# Patient Record
Sex: Male | Born: 1955 | Race: White | Hispanic: No | Marital: Married | Smoking: Never smoker
Health system: Southern US, Academic
[De-identification: ages and names within clinical notes are randomized; demographics above are authoritative.]

## PROBLEM LIST (undated history)

## (undated) ENCOUNTER — Encounter (HOSPITAL_COMMUNITY): Admission: RE | Payer: Self-pay | Source: Ambulatory Visit

## (undated) DIAGNOSIS — E119 Type 2 diabetes mellitus without complications: Secondary | ICD-10-CM

## (undated) DIAGNOSIS — M069 Rheumatoid arthritis, unspecified: Secondary | ICD-10-CM

## (undated) DIAGNOSIS — Z973 Presence of spectacles and contact lenses: Secondary | ICD-10-CM

## (undated) DIAGNOSIS — R233 Spontaneous ecchymoses: Secondary | ICD-10-CM

## (undated) DIAGNOSIS — N289 Disorder of kidney and ureter, unspecified: Secondary | ICD-10-CM

## (undated) DIAGNOSIS — M539 Dorsopathy, unspecified: Secondary | ICD-10-CM

## (undated) DIAGNOSIS — E039 Hypothyroidism, unspecified: Secondary | ICD-10-CM

## (undated) DIAGNOSIS — I219 Acute myocardial infarction, unspecified: Secondary | ICD-10-CM

## (undated) DIAGNOSIS — R238 Other skin changes: Secondary | ICD-10-CM

## (undated) DIAGNOSIS — R6889 Other general symptoms and signs: Secondary | ICD-10-CM

## (undated) DIAGNOSIS — M1990 Unspecified osteoarthritis, unspecified site: Secondary | ICD-10-CM

## (undated) DIAGNOSIS — C801 Malignant (primary) neoplasm, unspecified: Secondary | ICD-10-CM

## (undated) DIAGNOSIS — R12 Heartburn: Secondary | ICD-10-CM

## (undated) DIAGNOSIS — I10 Essential (primary) hypertension: Secondary | ICD-10-CM

## (undated) DIAGNOSIS — C73 Malignant neoplasm of thyroid gland: Secondary | ICD-10-CM

## (undated) DIAGNOSIS — E785 Hyperlipidemia, unspecified: Secondary | ICD-10-CM

## (undated) HISTORY — PX: HX HEART CATHETERIZATION: SHX148

## (undated) HISTORY — PX: HX WRIST FRACTURE TX: SHX121

## (undated) HISTORY — DX: Malignant neoplasm of thyroid gland (CMS HCC): C73

## (undated) HISTORY — PX: KNEE SURGERY: SHX244

## (undated) HISTORY — DX: Essential (primary) hypertension: I10

## (undated) HISTORY — DX: Type 2 diabetes mellitus without complications (CMS HCC): E11.9

## (undated) HISTORY — PX: TOTAL THYROIDECTOMY: SHX2547

## (undated) HISTORY — PX: COLONOSCOPY: WVUENDOPRO10

## (undated) HISTORY — DX: Disorder of kidney and ureter, unspecified: N28.9

## (undated) HISTORY — PX: SHOULDER SURGERY: SHX246

## (undated) HISTORY — DX: Rheumatoid arthritis, unspecified (CMS HCC): M06.9

## (undated) SURGERY — IR ARTHROGRAM - HIP LEFT
Laterality: Left

---

## 2010-01-09 ENCOUNTER — Ambulatory Visit (HOSPITAL_COMMUNITY): Payer: Self-pay | Admitting: INTERNAL MEDICINE

## 2016-01-13 LAB — BASIC METABOLIC PANEL
BUN/CREAT RATIO: 12
BUN: 20
CARBON DIOXIDE: 22
CHLORIDE: 103
CREATININE: 1.66
ESTIMATED GLOMERULAR FILTRATION RATE: 44
POTASSIUM: 4.7
SODIUM: 143

## 2017-04-07 ENCOUNTER — Ambulatory Visit
Admission: RE | Admit: 2017-04-07 | Discharge: 2017-04-07 | Disposition: A | Discharge status: Home / Self Care | Payer: PRIVATE HEALTH INSURANCE | Source: Ambulatory Visit | Attending: Orthopaedic Surgery | Admitting: Orthopaedic Surgery

## 2017-04-07 ENCOUNTER — Encounter (INDEPENDENT_AMBULATORY_CARE_PROVIDER_SITE_OTHER): Payer: Self-pay

## 2017-04-07 ENCOUNTER — Encounter (HOSPITAL_BASED_OUTPATIENT_CLINIC_OR_DEPARTMENT_OTHER): Payer: Self-pay | Admitting: Orthopaedic Surgery

## 2017-04-07 ENCOUNTER — Ambulatory Visit (HOSPITAL_BASED_OUTPATIENT_CLINIC_OR_DEPARTMENT_OTHER): Payer: PRIVATE HEALTH INSURANCE | Admitting: Orthopaedic Surgery

## 2017-04-07 VITALS — BP 124/77 | HR 88 | Temp 97.9°F | Ht 73.19 in | Wt 297.0 lb

## 2017-04-07 DIAGNOSIS — M069 Rheumatoid arthritis, unspecified: Secondary | ICD-10-CM | POA: Insufficient documentation

## 2017-04-07 DIAGNOSIS — I10 Essential (primary) hypertension: Secondary | ICD-10-CM | POA: Insufficient documentation

## 2017-04-07 DIAGNOSIS — M25751 Osteophyte, right hip: Secondary | ICD-10-CM

## 2017-04-07 DIAGNOSIS — E119 Type 2 diabetes mellitus without complications: Secondary | ICD-10-CM | POA: Insufficient documentation

## 2017-04-07 DIAGNOSIS — Z7984 Long term (current) use of oral hypoglycemic drugs: Secondary | ICD-10-CM | POA: Insufficient documentation

## 2017-04-07 DIAGNOSIS — M1611 Unilateral primary osteoarthritis, right hip: Secondary | ICD-10-CM | POA: Insufficient documentation

## 2017-04-07 DIAGNOSIS — Z7982 Long term (current) use of aspirin: Secondary | ICD-10-CM | POA: Insufficient documentation

## 2017-04-07 DIAGNOSIS — Z79891 Long term (current) use of opiate analgesic: Secondary | ICD-10-CM | POA: Insufficient documentation

## 2017-04-07 DIAGNOSIS — M25559 Pain in unspecified hip: Secondary | ICD-10-CM

## 2017-04-07 DIAGNOSIS — Z85850 Personal history of malignant neoplasm of thyroid: Secondary | ICD-10-CM | POA: Insufficient documentation

## 2017-04-07 DIAGNOSIS — Z79899 Other long term (current) drug therapy: Secondary | ICD-10-CM | POA: Insufficient documentation

## 2017-04-07 DIAGNOSIS — N289 Disorder of kidney and ureter, unspecified: Secondary | ICD-10-CM | POA: Insufficient documentation

## 2017-04-07 DIAGNOSIS — M25551 Pain in right hip: Secondary | ICD-10-CM

## 2017-04-07 MED ORDER — TRAMADOL 50 MG TABLET
1.00 | ORAL_TABLET | Freq: Four times a day (QID) | ORAL | 1 refills | Status: DC | PRN
Start: 2017-04-07 — End: 2017-05-24

## 2017-04-07 NOTE — Addendum Note (Signed)
Addended by: Mauri Pole R on: 04/07/2017 04:31 PM     Modules accepted: Orders

## 2017-04-07 NOTE — H&P (Signed)
[facility]   DEPARTMENT OF ORTHOPAEDICS  HISTORY AND PHYSICAL    CHIEF COMPLAINT  Right hip pain    HISTORY OF PRESENT ILLNESS  [name] is a 62 y.o. male who presents to the clinic today with a many month history of Hip pain. He has a history of type 2 diabetes, RA, thyroid CA, HTN, kidney disease (creatinine 1.65) and continued right hip pain. The pain is located in the right groin and radiates down into the right knee. He was seen by a local orthopedic physician who recommended a right hip injection. He had this done in November 2018 with 75% relief for one week. He has offered hip replacement by his local surgeon, but did not pursue this because that surgeon only offered posterior approach. The patient describes the pain as aching, sharp, shooting, throbbing and stabbing and rates the pain as 9/10. Walking and standing aggravates the pain and resting helps to alleviate the pain. The patient has been taking tramadol for the pain. He takes oral methotrexate for his RA. he has tried NSAIDs, Cortisone injections and Bracing. 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    MEDICATIONS  Current Outpatient Medications   Medication Sig   . ascorbic acid (VITAMIN C ORAL) Take by mouth   . aspirin (ECOTRIN) 81 mg Oral Tablet, Delayed Release (E.C.) Take 81 mg by mouth Once a day   . atorvastatin (LIPITOR) 40 mg Oral Tablet Take 40 mg by mouth Every evening   . calcium carbonate/vitamin D3 (VITAMIN D-3 ORAL) Take by mouth   . FOLIC ACID ORAL Take by mouth   . hydroxychloroquine (PLAQUENIL) 200 mg Oral Tablet Take 200 mg by mouth Twice daily   . IRON ORAL Take by mouth   . levothyroxine (SYNTHROID) 200 mcg Oral Tablet Take 200 mcg by mouth Every morning   . levothyroxine (SYNTHROID) 25 mcg Oral Tablet Take 25 mcg by mouth Every morning   . losartan (COZAAR) 50 mg Oral Tablet Take 50 mg by mouth Once a day   . MetFORMIN (GLUCOPHAGE) 1,000  mg Oral Tablet Take 1,000 mg by mouth Twice daily with food   . methotrexate 2.5 mg tablet Take by mouth Every 7 days   . metoprolol tartrate (LOPRESSOR) 25 mg Oral Tablet Take 25 mg by mouth Twice daily   . omega-3 fatty acids (FISH OIL ORAL) Take by mouth   . sulfaSALAzine (AZULFIDINE) 500 mg Oral Tablet Take 500 mg by mouth Twice daily   . traMADol (ULTRAM) 50 mg Oral Tablet Take by mouth Every 6 hours as needed for Pain       ALLERGIES  No Known Allergies    PAST MEDICAL HISTORY  Past Medical History:   Diagnosis Date   . Diabetes (CMS [place])    . HTN (hypertension)    . Kidney disease    . Rheumatoid arthritis (CMS [place])    . Thyroid cancer (CMS [place])          PAST SURGICAL HISTORY  Past Surgical History:   Procedure Laterality Date   . KNEE SURGERY     . SHOULDER SURGERY         SOCIAL HISTORY  Current Occupation:  Ecologist  Marital status:  Married  Patient resides:  With spouse  Current tobacco use:  None  Current alcohol use:  None  Recreational drug use:  None  FAMILY HISTORY  Family Medical History:     None            REVIEW OF SYSTEMS  General: Negative for fevers,  night sweats or unintentional weight loss  Cardiovascular: Negative for chest pain  Respiratory: Negative for shortness of breath  Gastrointestinal: Negative for digestive problems  Genitourinary: Negative for urinary problems  Musculoskeletal: positive for right hip pain    All other systems reviewed and negative    PHYSICAL EXAMINATION   BP 124/77   Pulse 88   Temp 36.6 C (97.9 F) (Thermal Scan)   Ht 1.859 m (6' 1.19")   Wt 134.7 kg (296 lb 15.4 oz)   BMI 38.98 kg/m       GEN:   NAD  EYES: white sclera, round pupils  CV:   PT and  DP pulses present bilaterally.  PULM:   Normal respiratory effort.    GI: soft, nontender abdomen.  MS:   No evidence of varus or valgus deformity. Right hip ROM with 80 degrees flexion, 0 degrees internal rotation, 10 degrees external rotation, 5 degrees abduction, 2 degrees of adduction.   . positive log roll on the right. Calves soft, supple, nontender and without erythema.  NEURO:   Alert and oriented to person, place and time.    PSYCHOSOCIAL:   Pleasant.  Normal affect.    WOUND/INCISION:   No wounds present.     RADIOGRAPHS  Radiographs were performed and reviewed at this visit.    X-Ray of bilateral hips was interpreted by PA-C in clinic to show moderate joint space narrowing and sclerosis, better evidenced on right hip MRI.    ASSESSMENT     ICD-10-CM    1. Hip pain M25.559 [facility] x-ray   2. Primary osteoarthritis of right hip M16.11    3. Rheumatoid arthritis, involving unspecified site, unspecified rheumatoid factor presence (CMS HCC) M06.9    4. Diabetes (CMS [place]) E11.9        PLAN  [name] is a 62 y.o. male who has right hip DJD, RA and type 2 diabetes.He has tried NSAIDs and Cortisone injections with little relief. Nothing short of a right total Hip replacement will relieve pain and improve function. We will begin the scheduling process for right total Hip replacement and medical clearance is required. The patient was given a book on joint arthroplasty today. We will see him back in clinic for their pre operative visit. They will require a 1-2 night inpatient stay. The patient was instructed to call with any new or concerning symptoms. All of the patient's questions were answered and he agreed to this plan. I will phone in tramadol to his pharmacy.      Cesar Pole, PA-C 04/07/2017, 16:16     Cc:   Cesar Rothman, MD  [facility] [address] [address]      Rheumatoid arthritis right hip with pannus and periarticular erosion  Will proceed with right  THA  Due to his size, we will do this postero-lateral   The patient was seen in tandem with the APP and these are my findings:  Hip pain    Primary osteoarthritis of right hip    Rheumatoid arthritis, involving unspecified site, unspecified rheumatoid factor presence (CMS [place])    Diabetes (CMS [place])  I personally saw and examined the patient. See physician's assistant note for additional details.     Cesar Code, MD  [phone]

## 2017-04-18 ENCOUNTER — Encounter (HOSPITAL_BASED_OUTPATIENT_CLINIC_OR_DEPARTMENT_OTHER): Payer: Self-pay | Admitting: Orthopaedic Surgery

## 2017-05-24 ENCOUNTER — Encounter (HOSPITAL_BASED_OUTPATIENT_CLINIC_OR_DEPARTMENT_OTHER): Payer: Self-pay

## 2017-05-24 ENCOUNTER — Ambulatory Visit (HOSPITAL_BASED_OUTPATIENT_CLINIC_OR_DEPARTMENT_OTHER): Payer: Self-pay | Admitting: Orthopaedic Surgery

## 2017-05-24 ENCOUNTER — Encounter (HOSPITAL_BASED_OUTPATIENT_CLINIC_OR_DEPARTMENT_OTHER): Payer: Self-pay | Admitting: Orthopaedic Surgery

## 2017-05-24 ENCOUNTER — Ambulatory Visit (HOSPITAL_BASED_OUTPATIENT_CLINIC_OR_DEPARTMENT_OTHER)
Admission: RE | Admit: 2017-05-24 | Discharge: 2017-05-24 | Disposition: A | Discharge status: Home / Self Care | Payer: PRIVATE HEALTH INSURANCE | Source: Ambulatory Visit

## 2017-05-24 ENCOUNTER — Ambulatory Visit (HOSPITAL_BASED_OUTPATIENT_CLINIC_OR_DEPARTMENT_OTHER): Payer: PRIVATE HEALTH INSURANCE

## 2017-05-24 ENCOUNTER — Ambulatory Visit (HOSPITAL_BASED_OUTPATIENT_CLINIC_OR_DEPARTMENT_OTHER): Payer: PRIVATE HEALTH INSURANCE | Admitting: Orthopaedic Surgery

## 2017-05-24 ENCOUNTER — Ambulatory Visit
Admission: RE | Admit: 2017-05-24 | Discharge: 2017-05-24 | Disposition: A | Discharge status: Home / Self Care | Payer: PRIVATE HEALTH INSURANCE | Source: Ambulatory Visit | Attending: Orthopaedic Surgery | Admitting: Orthopaedic Surgery

## 2017-05-24 VITALS — BP 150/82 | HR 64 | Temp 97.2°F | Ht 72.68 in | Wt 301.6 lb

## 2017-05-24 DIAGNOSIS — Z7982 Long term (current) use of aspirin: Secondary | ICD-10-CM | POA: Insufficient documentation

## 2017-05-24 DIAGNOSIS — Z01818 Encounter for other preprocedural examination: Secondary | ICD-10-CM | POA: Insufficient documentation

## 2017-05-24 DIAGNOSIS — E1129 Type 2 diabetes mellitus with other diabetic kidney complication: Secondary | ICD-10-CM | POA: Insufficient documentation

## 2017-05-24 DIAGNOSIS — I129 Hypertensive chronic kidney disease with stage 1 through stage 4 chronic kidney disease, or unspecified chronic kidney disease: Secondary | ICD-10-CM | POA: Insufficient documentation

## 2017-05-24 DIAGNOSIS — M1611 Unilateral primary osteoarthritis, right hip: Secondary | ICD-10-CM

## 2017-05-24 DIAGNOSIS — E1122 Type 2 diabetes mellitus with diabetic chronic kidney disease: Secondary | ICD-10-CM

## 2017-05-24 DIAGNOSIS — M05751 Rheumatoid arthritis with rheumatoid factor of right hip without organ or systems involvement: Secondary | ICD-10-CM

## 2017-05-24 DIAGNOSIS — N183 Chronic kidney disease, stage 3 (moderate): Secondary | ICD-10-CM | POA: Insufficient documentation

## 2017-05-24 DIAGNOSIS — Z85850 Personal history of malignant neoplasm of thyroid: Secondary | ICD-10-CM | POA: Insufficient documentation

## 2017-05-24 DIAGNOSIS — M069 Rheumatoid arthritis, unspecified: Secondary | ICD-10-CM | POA: Insufficient documentation

## 2017-05-24 DIAGNOSIS — Z79899 Other long term (current) drug therapy: Secondary | ICD-10-CM | POA: Insufficient documentation

## 2017-05-24 DIAGNOSIS — Z7984 Long term (current) use of oral hypoglycemic drugs: Secondary | ICD-10-CM | POA: Insufficient documentation

## 2017-05-24 HISTORY — DX: Spontaneous ecchymoses: R23.3

## 2017-05-24 HISTORY — DX: Acute myocardial infarction, unspecified (CMS HCC): I21.9

## 2017-05-24 HISTORY — DX: Presence of spectacles and contact lenses: Z97.3

## 2017-05-24 HISTORY — DX: Heartburn: R12

## 2017-05-24 HISTORY — DX: Type 2 diabetes mellitus without complications (CMS HCC): E11.9

## 2017-05-24 HISTORY — DX: Other general symptoms and signs: R68.89

## 2017-05-24 HISTORY — DX: Dorsopathy, unspecified: M53.9

## 2017-05-24 HISTORY — DX: Malignant (primary) neoplasm, unspecified (CMS HCC): C80.1

## 2017-05-24 HISTORY — DX: Unspecified osteoarthritis, unspecified site: M19.90

## 2017-05-24 HISTORY — DX: Hyperlipidemia, unspecified: E78.5

## 2017-05-24 HISTORY — DX: Other skin changes: R23.8

## 2017-05-24 HISTORY — DX: Hypothyroidism, unspecified: E03.9

## 2017-05-24 LAB — CBC WITH DIFF
BASOPHIL #: 0.07 x10ˆ3/uL (ref 0.00–0.20)
BASOPHIL %: 2 %
EOSINOPHIL #: 0.45 x10ˆ3/uL (ref 0.00–0.50)
EOSINOPHIL %: 9 %
HCT: 42.2 % (ref 36.7–47.0)
HGB: 14.1 g/dL (ref 12.5–16.3)
LYMPHOCYTE #: 1.02 x10ˆ3/uL (ref 1.00–4.80)
LYMPHOCYTE %: 20 %
MCH: 33 pg (ref 27.4–33.0)
MCHC: 33.3 g/dL (ref 32.5–35.8)
MCV: 99.2 fL (ref 78.0–100.0)
MONOCYTE #: 0.54 x10ˆ3/uL (ref 0.30–1.00)
MONOCYTE %: 11 %
MPV: 8.4 fL (ref 7.5–11.5)
NEUTROPHIL #: 2.97 x10ˆ3/uL (ref 1.50–7.70)
NEUTROPHIL %: 59 %
PLATELETS: 212 x10ˆ3/uL (ref 140–450)
RBC: 4.25 x10ˆ6/uL (ref 4.06–5.63)
RDW: 13.6 % (ref 12.0–15.0)
WBC: 5 x10ˆ3/uL (ref 3.5–11.0)

## 2017-05-24 LAB — BASIC METABOLIC PANEL
ANION GAP: 7 mmol/L (ref 4–13)
BUN/CREA RATIO: 13 (ref 6–22)
BUN: 19 mg/dL (ref 8–25)
CALCIUM: 9.4 mg/dL (ref 8.5–10.2)
CHLORIDE: 109 mmol/L (ref 96–111)
CO2 TOTAL: 26 mmol/L (ref 22–32)
CREATININE: 1.47 mg/dL — ABNORMAL HIGH (ref 0.62–1.27)
ESTIMATED GFR: 49 mL/min/1.73mˆ2 — ABNORMAL LOW (ref >59)
GLUCOSE: 115 mg/dL (ref 65–139)
POTASSIUM: 4.7 mmol/L (ref 3.5–5.1)
SODIUM: 142 mmol/L (ref 136–145)

## 2017-05-24 LAB — ABO/RH AND ANTIBODY SCREEN
ABO/RH(D): A POS
ANTIBODY SCREEN: NEGATIVE

## 2017-05-24 LAB — POC BLOOD GLUCOSE (RESULTS): GLUCOSE, POC: 116 mg/dl — ABNORMAL HIGH (ref 70–105)

## 2017-05-24 MED ORDER — CELECOXIB 400 MG CAPSULE
400.0000 mg | ORAL_CAPSULE | Freq: Once | ORAL | Status: DC
Start: 2017-05-24 — End: 2017-05-24

## 2017-05-24 NOTE — H&P (Signed)
[facility]   DEPARTMENT OF ORTHOPAEDICS  HISTORY AND PHYSICAL      PRE-OPERATIVE DIAGNOSIS  Right hip DJD    CHIEF COMPLAINT  Pre-Operative History and Physical Examination for R THA scheduled on 06/15/17.    HISTORY OF PRESENT ILLNESS   [name] is a 62 y.o. male who returns to the clinic today for his Pre-Operative History and Physical Examination. The patient is scheduled for R THA on 06/15/17. The patient has a history of DJD, RA, thyroid CA, kidney disease and T2DM. He is taking methotrexate. The patient has failed conservative therapies such as NSAIDs. After discussion with the patient, we decided that surgery is the best option at this time. Today, the patient denies recent fevers/chills, fatigue, weight loss, N/V, URI or UTI and he denies any open wounds on the body. negative personal history of blood clots. negative family history of blood clots. negative history of latex or metal allergy. negative history of anesthesia complications. negative history of sleep apnea. The patient has been cleared by his PCP, Dr. Ellison Carwin. Labs are acceptable. He will have a stress test done next week, recommended by his new cardiologist. He denies any recent CP or SOB. He will have chest CT on Friday to reassess his mediastinal nodules. He does weekly methotrexate injections.     MEDICATIONS  Current Outpatient Medications   Medication Sig   . ascorbic acid (VITAMIN C ORAL) Take by mouth   . aspirin (ECOTRIN) 81 mg Oral Tablet, Delayed Release (E.C.) Take 81 mg by mouth Once a day   . atorvastatin (LIPITOR) 40 mg Oral Tablet Take 40 mg by mouth Every evening   . calcium carbonate/vitamin D3 (VITAMIN D-3 ORAL) Take by mouth   . FOLIC ACID ORAL Take by mouth   . hydroxychloroquine (PLAQUENIL) 200 mg Oral Tablet Take 200 mg by mouth Twice daily   . IRON ORAL Take by mouth   . levothyroxine (SYNTHROID) 200 mcg Oral Tablet Take 200 mcg by mouth Every morning   . levothyroxine (SYNTHROID) 25  mcg Oral Tablet Take 50 mcg by mouth Every morning    . losartan (COZAAR) 50 mg Oral Tablet Take 50 mg by mouth Once a day   . MetFORMIN (GLUCOPHAGE) 1,000 mg Oral Tablet Take 1,000 mg by mouth Twice daily with food   . methotrexate 2.5 mg tablet Take by mouth Every 7 days   . metoprolol tartrate (LOPRESSOR) 25 mg Oral Tablet Take 25 mg by mouth Twice daily   . omega-3 fatty acids (FISH OIL ORAL) Take by mouth   . sulfaSALAzine (AZULFIDINE) 500 mg Oral Tablet Take 500 mg by mouth Twice daily   . traMADol (ULTRAM) 50 mg Oral Tablet Take 1 Tab (50 mg total) by mouth Every 6 hours as needed for Pain       ALLERGIES  No Known Allergies  denies allergy to Latex, metal, or acrylic.    PAST MEDICAL HISTORY  Past Medical History:   Diagnosis Date   . Diabetes (CMS [place])    . HTN (hypertension)    . Kidney disease    . Rheumatoid arthritis (CMS [place])    . Thyroid cancer (CMS [place])          denies history of DVT, PE, or MRSA.     PAST SURGICAL HISTORY  Past Surgical History:   Procedure Laterality Date   . KNEE SURGERY     . SHOULDER SURGERY  SOCIAL HISTORY  Social History     Tobacco Use   . Smoking status: Never Smoker   . Smokeless tobacco: Never Used   Substance Use Topics   . Alcohol use: Not on file   . Drug use: Not on file       FAMILY HISTORY  Family Medical History:     None              REVIEW OF SYSTEMS  General: Negative for fevers or night sweats   Cardiovascular: Negative for chest pain  Respiratory: Negative for shortness of breath  Gastrointestinal: Negative for digestive problems  Genitourinary: Negative for urinary problems  Musculoskeletal: positive for right hip pain    All other systems reviewed and negative.    PHYSICAL EXAMINATION   BP (!) 150/82   Pulse 64   Temp 36.2 C (97.2 F) (Thermal Scan)   Ht 1.846 m (6' 0.68")   Wt (!) 136.8 kg (301 lb 9.4 oz)   BMI 40.14 kg/m       GEN:   NAD  EYES:  White sclera, round pupils.  NECK:   Full ROM. Trachea midline.  CV:   DP pulses present  bilaterally.  PULM:   Normal respiratory effort.    ABD:   Non distended.  MS:  Right hip ROM with 80 degrees flexion, 0 degrees internal rotation, 10 degrees external rotation, 5 degrees abduction, 2 degrees adduction. Peroneal and tibial nerves are intact. Calves soft, supple, nontender and without erythema.  NEURO:   Alert and oriented to person, place and time.   PSYCHOSOCIAL:   Pleasant.  Normal affect.    WOUND/INCISION:   No wounds present.      RADIOGRAPHS  previous radiographs were reviewed at this visit to show right hip with joint space narrowing and sclerosis.      ASSESSMENT   DJD Right  hip    ICD-10-CM    1. Pre-op evaluation Z01.818 CBC/Diff (PAU)     Basic Metabolic Panel (PAU)     ABO/RH AND ANTIBODY SCREEN   2. Primary osteoarthritis of right hip M16.11 CBC/Diff (PAU)     Basic Metabolic Panel (PAU)     ABO/RH AND ANTIBODY SCREEN   3. Type 2 diabetes mellitus with stage 3 chronic kidney disease, without long-term current use of insulin (CMS HCC) E11.22     N18.3    4. 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 R THA for rheumatoid arthritis  Date of surgery is 06/15/17.   Surgical spinal anesthesia will be used. Multimodal will be ordered.  Prophylactic antibiotic will be ancef.   He saw his cardiologist yesterday and will have a stress test done next week, we will await these results.   Due to his creatinine (1.65) he will not be prescribed any NSAIDs.  Implants will be smith and nephew.   Inpatient length of stay is expected to be 1-2 nights.   DVT prophylaxis will be xarelto 10mg  due to kidney disease (not in study)  Disposition will be Home discharge   The patient Accept blood products.   The patient Accept photographs or videos at the time of surgery.   2.  Medical records were reviewed at today's visit and the patient has received surgical clearance from Dr. Otelia Limes.  The risks and benefits of the surgery were discussed in  detail with the patient at today's  visit including infection, implant loosening, poly wear, fracture, DVT, pulmonary embolus and anesthesia complications. We explained that infection can be so severe as to necessitate multiple surgeries, permanent removal of the prothesis resulting in a shortened painful limb, or even amputation. We explained that these complication are relatively rare and sometimes devastating. We explained that we are obligated to explain the risks and alternative treatments so that the patient can make an informed decision.  Surgical Consent for a R THA was obtained and the Consent Form was signed and witnessed.  It is my belief that this was Informed Consent and the patient understood all key aspects of the operation.  3.  Pre-operative orders were placed. The patient was ordered IV tranexamic acid to be given intraoperatively.  We are going to draw labs today (CBC, BMP, ABO).  4. Pre operative instructions were reviewed, please see "patient instructions" in the AVS.   5. The patient was made aware that they will be discharged from the hospital with a prescription for opioids for post operative pain. We prescribe roxicodone 5mg , 2 tabs to take by mouth every 4 hours as needed for pain. We do not intend to refill this prescription, and the patient was made aware of this. The patients prior medication history was reviewed, and I have also reviewed information contained in the state controlled prescription drug monitoring database. I have discussed any history of non-pharmacological treatment with this patient. The patient denies a history of substance abuse treatment. Physical exam findings and/or clinical history warranting use of opioid treatment include: post operative pain. My goal of treatment is to relieve short term post operative pain. I have discussed the risk of opioid addiction with the patient. I have also discussed the risk of using sedatives and alcohol while taking opioids.            The patient was instructed to call with any concerns prior to surgery.  All the patient's questions were answered at this time and he understood and agreed to this plan.        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 See physician's assistant note for additional details.     Jacqualine Code, MD  [phone]

## 2017-05-24 NOTE — Progress Notes (Signed)
Total Joint History and Physical Teaching  HPI:   [name] is a 62 y.o. male seen in [place] for pre op teaching leading up to R THA with Dr. Caryl Comes.    Date of Surg: 06/15/17                       Past Medical History:   Diagnosis Date   . Diabetes (CMS [place])    . HTN (hypertension)    . Kidney disease    . Rheumatoid arthritis (CMS [place])    . Thyroid cancer (CMS [facility])                             Past Surgical History:   Procedure Laterality Date   . KNEE SURGERY     . SHOULDER SURGERY           Social Hx:  reports that he has never smoked. He has never used smokeless tobacco.                     Social History     Tobacco Use   Smoking Status Never Smoker   Smokeless Tobacco Never Used                      Pre operative Assessment:   Home environment:  Lives in a house with 2 steps to enter.  No steps inside with walk in shower    Social situation:  Lives with wife    Home Equipment:  FWW, cane  Plans to get raised toilet seat    Current Level of Function: antalgic gait with limited/modified activity    Patient Goals: return to work, golf, get outside without hurting    Pt reviewed Playbook for Total Joint Replacement with patient.   Discussed home set up and adaptations to meet post op needs.      Anticipated D/C Needs:   Equipment: hip kit for LB ADLs  Pt will get raised toilet seat    Recommendation: home with assistance    Therapist :     Judie Bonus, PT 05/24/2017 12:19  Pager #: [phone]

## 2017-05-24 NOTE — Nursing Note (Signed)
Patient attended pre op education class with their coach.  They have a playbook with my contact information including my pager number. Education material reviewed with patient.   They were encouraged to bring this information with them in their playbooks to surgery.  They were instructed that they would receive 84 tablets of oxycodone for post op pain control and that this medication would not be refilled.  They asked and answered questions appropriately. To determine what will be used for DVT prevention after discussion of PEPPER study

## 2017-05-24 NOTE — Anesthesia Preprocedure Evaluation (Addendum)
ANESTHESIA PRE-OP EVALUATION  Planned Procedure: ARTHROPLASTY HIP TOTAL (Right Hip)  Review of Systems                   Pulmonary  negative pulmonary ROS,    Cardiovascular    Hypertension, CAD and cardiac stents No angina,  Exercise Tolerance: > or = 4 METS        GI/Hepatic/Renal    Cr=1.4 and renal insufficiency no liver disease     Endo/Other    hypothyroidism and rheumatoid arthritis,   type 2 diabetes/ controlled     Neuro/Psych/MS   negative neuro/psych ROS,      Cancer                     Physical Assessment      Airway       Mallampati: III    TM distance: >3 FB    Neck ROM: full  Mouth Opening: good.  No Facial hair  No Beard  No endotracheal tube present  No Tracheostomy present    Dental       Dentition intact             Pulmonary    Breath sounds clear to auscultation       Cardiovascular    Rhythm: regular  Rate: Normal       Other findings            Plan  Planned anesthesia type: spinal    ASA 3         Anesthetic plan and risks discussed with patient and spouse.                                     EKG: Within last six months   04/13/2017   Findings:  Normal sinus Rhythm  Normal ECG    CXR: In last year   04/13/2017   Impression:  No acute finding.     Other Studies: labs 05/24/2017      Consults:     Patient instructed to take the following medications day of surgery, .

## 2017-05-24 NOTE — Patient Instructions (Addendum)
[facility] Pre-operative Instructions    Do not eat or drink after midnight the night before surgery    You may take ESSENTIALmedications with a small sip of water the morning of surgery    Do not chew tobacco after midnight the night before surgery    Do not chew gum or suck on mints after midnight before surgery    Do not take blood pressure medications called ACE Inhibitors the morning of surgery.  These are medications like Lisinopril (Prinivil, Zestril), Fosinopril(Monopril), Quinapril (Accupril), Captopril (Capoten)    Do not take blood pressure medications called ARBS the morning of surgery.  These aremedications like Losartan (Cozaar), Olmesartan (Benicar), Valsartan (Diovan), Irbesartan (Avapro), Atacand (Edarbi), Telmisartan (Micardis)    Stop taking aspirin and things that contain aspirin the week before surgery as it contributes to increased bleeding.  Aspirin is found in many combination medications like Alka Seltzer, BC Powder, and even some headache medications.     Stop sulfasalazine one week before surgery.  Please do not stop methotrexate and Plaquenil.    Stop Fish Oil and Vitamin E one week before surgery to reduce the risk of bleeding.    Stop taking Advil (ibuprofen, Motrin) or Aleve (naproxen,Naprosyn) and most other anti-inflammatory medications 3 days before surgery.  You may take Celebrex right up until the day before surgery.       Eat a healthier diet before and after surgery as it will help you heal faster.  Concentrate on foods high in protein like meats, fish, eggs, yoghurt, nuts and beans as well as fresh fruits, vegetables and whole grains.  Protein supplements found in Ensure and Boost can also be healthful.    Avoid rashes, cuts, scrapes, abrasions, scratches and burns near the surgical site as these can raise the risk of infection.  Dogs and cats are a bigsource of these.  Surgery is usually canceled when we encounter these the morning of  surgery.    Do not shave your leg or surgical site for at least one week before surgery.  Shaving often results in irritation if notnicks and scratches and these raise your risk for infection.  We will carefully remove hair at the surgical site (if needed) on the day of surgery.    Cleanse but DO NOT SCOUR the surgical site the night before andmorning of surgery with the Chlorhexidine soap given to you by the PAT nurses.  Use a soft clean cloth to apply.    Please sleep in freshly laundered sheets and freshly laundered bed clothes the night before surgery inorder to reduce the risk of infection.    If you become ill or develop and open sore or wound right before surgery, please call [phone] to reschedule your surgery for another date.  If after hours or on weekends,ask to speak to the resident on call.  Additionally, you may call the OR front desk after hours at [phone] if you are unable to reach the resident.    You may call the OR front desk if you have questions about your surgery time - [phone]    Peyton Bottoms MD  Assistant Professor Orthopaedic Surgery  [phone]

## 2017-05-30 ENCOUNTER — Telehealth (HOSPITAL_BASED_OUTPATIENT_CLINIC_OR_DEPARTMENT_OTHER): Payer: Self-pay | Admitting: Orthopaedic Surgery

## 2017-05-30 NOTE — Telephone Encounter (Signed)
Patient called and stated he went to urgent care as he was feeling ill.  His surgery is 3/13  They gave him 10 days of an antibiotic and 5 days of prednisone.  Instructed that if he is no better closer to his surgery date to please call me.  She verbalized understanding

## 2017-06-06 ENCOUNTER — Encounter (HOSPITAL_COMMUNITY): Payer: Self-pay

## 2017-06-06 NOTE — Progress Notes (Signed)
OUTPATIENT CARE MANAGEMENT ASSESSMENT    Care Management Pre-admission assessment -Met with pt and his wife during [place] pre-op class. He has no advanced directives.     Home Set up - Lives with his wife in a ranch house with 2 entry steps. Has a walk in shower.    Equipment - Has cane, walkers    Planned Therapy - His plan is to go home with family assist.    RAPT - 11    Planned DVT Prophylaxis - Xarelto    Planned Medicines - Has prescription coverage through [facility]- ID- [id number]. Called [phone], per Geraldine Solar is covered-first fill will be $412.00. No NSAIDs; ondansetron will need PA; oxycodone has QL 180/30.   Called for [phone], per Song, ondansetron has QL 21/7; oxycodone 84/7-should go through.  FYI--Enoxaparin-no PA--$72.46.    Planned procedure & surgery date - 3/13-R THA

## 2017-06-09 ENCOUNTER — Ambulatory Visit (HOSPITAL_BASED_OUTPATIENT_CLINIC_OR_DEPARTMENT_OTHER): Payer: Self-pay | Admitting: Orthopaedic Surgery

## 2017-06-09 DIAGNOSIS — Z96641 Presence of right artificial hip joint: Secondary | ICD-10-CM

## 2017-06-09 NOTE — Telephone Encounter (Signed)
Regarding: DME orders requested   ----- Message from Darius Bump sent at 06/09/2017 10:25 AM EST -----  Dr. Caryl Comes pt    Sonia Baller, a nurse with the pt's insurance company is requesting that an order for Bariatric raised toilet seat and a front wheeled walker be sent for the pt    [facility] Fax: [phone]    Thank you

## 2017-06-10 ENCOUNTER — Encounter (HOSPITAL_BASED_OUTPATIENT_CLINIC_OR_DEPARTMENT_OTHER): Payer: Self-pay | Admitting: Surgical

## 2017-06-14 MED ORDER — SODIUM CHLORIDE 0.9 % INTRAVENOUS SOLUTION
1000.00 mg | Freq: Once | INTRAVENOUS | Status: AC
Start: 2017-06-15 — End: 2017-06-15
  Administered 2017-06-15: 1000 mg via INTRAVENOUS
  Administered 2017-06-15: 0 mg via INTRAVENOUS
  Administered 2017-06-15: 16:00:00 1000 mg via INTRAVENOUS
  Filled 2017-06-14: qty 10

## 2017-06-14 MED ORDER — DEXTROSE 5 % IN WATER (D5W) INTRAVENOUS SOLUTION
3.0000 g | Freq: Once | INTRAVENOUS | Status: AC
Start: 2017-06-15 — End: 2017-06-15
  Administered 2017-06-15: 2 g via INTRAVENOUS
  Administered 2017-06-15: 3 g via INTRAVENOUS
  Filled 2017-06-14: qty 30

## 2017-06-14 MED ORDER — TRANEXAMIC ACID 1,000 MG/10 ML (100 MG/ML) INTRAVENOUS SOLUTION
1000.0000 mg | Freq: Once | INTRAVENOUS | Status: AC
Start: 2017-06-15 — End: 2017-06-15
  Administered 2017-06-15: 1000 mg via INTRAVENOUS
  Filled 2017-06-14: qty 10

## 2017-06-14 MED ORDER — EPINEPHRINE 1 MG/ML (1 ML) INJECTION SOLUTION
Freq: Once | INTRAMUSCULAR | Status: DC
Start: 2017-06-15 — End: 2017-06-14

## 2017-06-15 ENCOUNTER — Inpatient Hospital Stay
Admission: RE | Admit: 2017-06-15 | Discharge: 2017-06-16 | DRG: 470 | Disposition: A | Discharge status: Home / Self Care | Payer: PRIVATE HEALTH INSURANCE | Source: Ambulatory Visit | Attending: Orthopaedic Surgery | Admitting: Orthopaedic Surgery

## 2017-06-15 ENCOUNTER — Inpatient Hospital Stay (HOSPITAL_BASED_OUTPATIENT_CLINIC_OR_DEPARTMENT_OTHER): Payer: PRIVATE HEALTH INSURANCE | Admitting: Certified Registered"

## 2017-06-15 ENCOUNTER — Inpatient Hospital Stay (HOSPITAL_COMMUNITY): Payer: PRIVATE HEALTH INSURANCE | Admitting: Family

## 2017-06-15 ENCOUNTER — Encounter (HOSPITAL_COMMUNITY): Payer: Self-pay

## 2017-06-15 ENCOUNTER — Inpatient Hospital Stay (HOSPITAL_COMMUNITY)
Admission: RE | Admit: 2017-06-15 | Discharge: 2017-06-15 | Disposition: A | Discharge status: Home / Self Care | Payer: PRIVATE HEALTH INSURANCE | Source: Ambulatory Visit

## 2017-06-15 ENCOUNTER — Inpatient Hospital Stay (HOSPITAL_COMMUNITY): Payer: PRIVATE HEALTH INSURANCE

## 2017-06-15 ENCOUNTER — Inpatient Hospital Stay (HOSPITAL_COMMUNITY): Payer: PRIVATE HEALTH INSURANCE | Admitting: Orthopaedic Surgery

## 2017-06-15 ENCOUNTER — Encounter (HOSPITAL_COMMUNITY)
Admission: RE | Disposition: A | Discharge status: Home / Self Care | Payer: Self-pay | Source: Ambulatory Visit | Attending: Orthopaedic Surgery

## 2017-06-15 DIAGNOSIS — M1611 Unilateral primary osteoarthritis, right hip: Secondary | ICD-10-CM

## 2017-06-15 DIAGNOSIS — M069 Rheumatoid arthritis, unspecified: Secondary | ICD-10-CM

## 2017-06-15 DIAGNOSIS — D72829 Elevated white blood cell count, unspecified: Secondary | ICD-10-CM | POA: Diagnosis not present

## 2017-06-15 DIAGNOSIS — Z85850 Personal history of malignant neoplasm of thyroid: Secondary | ICD-10-CM

## 2017-06-15 DIAGNOSIS — Z6841 Body Mass Index (BMI) 40.0 and over, adult: Secondary | ICD-10-CM

## 2017-06-15 DIAGNOSIS — Z96641 Presence of right artificial hip joint: Secondary | ICD-10-CM

## 2017-06-15 DIAGNOSIS — N179 Acute kidney failure, unspecified: Secondary | ICD-10-CM | POA: Diagnosis not present

## 2017-06-15 DIAGNOSIS — Z79891 Long term (current) use of opiate analgesic: Secondary | ICD-10-CM

## 2017-06-15 DIAGNOSIS — Z7984 Long term (current) use of oral hypoglycemic drugs: Secondary | ICD-10-CM

## 2017-06-15 DIAGNOSIS — I129 Hypertensive chronic kidney disease with stage 1 through stage 4 chronic kidney disease, or unspecified chronic kidney disease: Secondary | ICD-10-CM | POA: Diagnosis present

## 2017-06-15 DIAGNOSIS — Z79890 Hormone replacement therapy (postmenopausal): Secondary | ICD-10-CM

## 2017-06-15 DIAGNOSIS — D62 Acute posthemorrhagic anemia: Secondary | ICD-10-CM | POA: Diagnosis not present

## 2017-06-15 DIAGNOSIS — Z9889 Other specified postprocedural states: Secondary | ICD-10-CM

## 2017-06-15 DIAGNOSIS — Z7982 Long term (current) use of aspirin: Secondary | ICD-10-CM

## 2017-06-15 DIAGNOSIS — Z79899 Other long term (current) drug therapy: Secondary | ICD-10-CM

## 2017-06-15 DIAGNOSIS — N189 Chronic kidney disease, unspecified: Secondary | ICD-10-CM | POA: Diagnosis present

## 2017-06-15 DIAGNOSIS — T380X5A Adverse effect of glucocorticoids and synthetic analogues, initial encounter: Secondary | ICD-10-CM | POA: Diagnosis present

## 2017-06-15 DIAGNOSIS — E1122 Type 2 diabetes mellitus with diabetic chronic kidney disease: Secondary | ICD-10-CM | POA: Diagnosis present

## 2017-06-15 LAB — CBC
HCT: 37.2 % (ref 36.7–47.0)
HGB: 12.6 g/dL (ref 12.5–16.3)
MCH: 33.2 pg — ABNORMAL HIGH (ref 27.4–33.0)
MCHC: 33.8 g/dL (ref 32.5–35.8)
MCV: 98.2 fL (ref 78.0–100.0)
MPV: 9.5 fL (ref 7.5–11.5)
PLATELETS: 142 x10ˆ3/uL (ref 140–450)
RBC: 3.79 x10ˆ6/uL — ABNORMAL LOW (ref 4.06–5.63)
RDW: 13.5 % (ref 12.0–15.0)
WBC: 13 x10ˆ3/uL — ABNORMAL HIGH (ref 3.5–11.0)

## 2017-06-15 LAB — TYPE AND SCREEN
ABO/RH(D): A POS
ANTIBODY SCREEN: NEGATIVE

## 2017-06-15 LAB — POC BLOOD GLUCOSE (RESULTS)
GLUCOSE, POC: 146 mg/dl — ABNORMAL HIGH (ref 70–105)
GLUCOSE, POC: 146 mg/dl — ABNORMAL HIGH (ref 70–105)
GLUCOSE, POC: 199 mg/dl — ABNORMAL HIGH (ref 70–105)
GLUCOSE, POC: 297 mg/dl — ABNORMAL HIGH (ref 70–105)

## 2017-06-15 LAB — CREATININE WITH EGFR
CREATININE: 1.45 mg/dL — ABNORMAL HIGH (ref 0.62–1.27)
ESTIMATED GFR: 49 mL/min/1.73mˆ2 — ABNORMAL LOW (ref >59)

## 2017-06-15 SURGERY — ARTHROPLASTY HIP TOTAL
Anesthesia: Spinal | Site: Hip | Laterality: Right | Wound class: Clean Wound: Uninfected operative wounds in which no inflammation occurred

## 2017-06-15 MED ORDER — BENZOCAINE-MENTHOL SORE THROAT LOZENGE WRAPPER
1.0000 | LOZENGE | Status: DC | PRN
Start: 2017-06-15 — End: 2017-06-16
  Filled 2017-06-15: qty 3

## 2017-06-15 MED ORDER — ETHYL ALCOHOL 62 % (NOZIN NASAL SANITIZER) NASAL SOLUTION - BULK BOTTLE
1.0000 | Freq: Three times a day (TID) | NASAL | Status: DC
Start: 2017-06-15 — End: 2017-06-16
  Administered 2017-06-15 – 2017-06-16 (×2): 1 via NASAL

## 2017-06-15 MED ORDER — DEXMEDETOMIDINE 4 MCG/ML IV DILUTION
Freq: Once | INTRAMUSCULAR | Status: DC | PRN
Start: 2017-06-15 — End: 2017-06-15
  Administered 2017-06-15: 14:00:00 20 ug via INTRAVENOUS
  Administered 2017-06-15: 13:00:00 8 ug via INTRAVENOUS

## 2017-06-15 MED ORDER — DEXTROSE 5 % IN WATER (D5W) INTRAVENOUS SOLUTION
3.0000 g | Freq: Three times a day (TID) | INTRAVENOUS | Status: AC
Start: 2017-06-15 — End: 2017-06-16
  Administered 2017-06-15: 0 g via INTRAVENOUS
  Administered 2017-06-15 – 2017-06-16 (×2): 3 g via INTRAVENOUS
  Administered 2017-06-16: 0 g via INTRAVENOUS
  Filled 2017-06-15 (×2): qty 30

## 2017-06-15 MED ORDER — PHENYLEPHRINE 50 MG/250 ML (200 MCG/ML) IN 0.9 % SODIUM CHLORIDE IV
INTRAVENOUS | Status: DC | PRN
Start: 2017-06-15 — End: 2017-06-15
  Administered 2017-06-15: 0 ug/kg/min via INTRAVENOUS
  Administered 2017-06-15 (×2): 0.2 ug/kg/min via INTRAVENOUS

## 2017-06-15 MED ORDER — RIVAROXABAN 10 MG TABLET
10.0000 mg | ORAL_TABLET | Freq: Every day | ORAL | 0 refills | Status: DC
Start: 2017-06-15 — End: 2018-06-12

## 2017-06-15 MED ORDER — ATORVASTATIN 40 MG TABLET
40.00 mg | ORAL_TABLET | Freq: Every evening | ORAL | Status: DC
Start: 2017-06-15 — End: 2017-06-16
  Administered 2017-06-15: 40 mg via ORAL
  Filled 2017-06-15 (×2): qty 1

## 2017-06-15 MED ORDER — SENNOSIDES 8.6 MG-DOCUSATE SODIUM 50 MG TABLET
2.0000 | ORAL_TABLET | Freq: Two times a day (BID) | ORAL | Status: DC
Start: 2017-06-15 — End: 2017-06-16
  Administered 2017-06-15 – 2017-06-16 (×2): 2 via ORAL
  Filled 2017-06-15 (×2): qty 2

## 2017-06-15 MED ORDER — SODIUM CHLORIDE 0.9 % (FLUSH) INJECTION SYRINGE
2.0000 mL | INJECTION | INTRAMUSCULAR | Status: DC | PRN
Start: 2017-06-15 — End: 2017-06-15

## 2017-06-15 MED ORDER — ONDANSETRON HCL (PF) 4 MG/2 ML INJECTION SOLUTION
4.0000 mg | Freq: Three times a day (TID) | INTRAMUSCULAR | Status: DC
Start: 2017-06-16 — End: 2017-06-16
  Administered 2017-06-16: 0 mg via INTRAVENOUS

## 2017-06-15 MED ORDER — PROPOFOL 10 MG/ML INTRAVENOUS EMULSION
INTRAVENOUS | Status: DC | PRN
Start: 2017-06-15 — End: 2017-06-15
  Administered 2017-06-15: 50 ug/kg/min via INTRAVENOUS
  Administered 2017-06-15: 60 ug/kg/min via INTRAVENOUS
  Administered 2017-06-15: 70 ug/kg/min via INTRAVENOUS
  Administered 2017-06-15: 13:00:00 50 ug/kg/min via INTRAVENOUS

## 2017-06-15 MED ORDER — SODIUM CHLORIDE 0.9 % (FLUSH) INJECTION SYRINGE
2.0000 mL | INJECTION | Freq: Three times a day (TID) | INTRAMUSCULAR | Status: DC
Start: 2017-06-15 — End: 2017-06-16
  Administered 2017-06-15 – 2017-06-16 (×2): 0 mL

## 2017-06-15 MED ORDER — ONDANSETRON HCL (PF) 4 MG/2 ML INJECTION SOLUTION
4.0000 mg | Freq: Once | INTRAMUSCULAR | Status: DC
Start: 2017-06-15 — End: 2017-06-15

## 2017-06-15 MED ORDER — FENTANYL (PF) 50 MCG/ML INJECTION SOLUTION
25.0000 ug | INTRAMUSCULAR | Status: DC | PRN
Start: 2017-06-15 — End: 2017-06-15

## 2017-06-15 MED ORDER — MIDAZOLAM 1 MG/ML INJECTION SOLUTION
Freq: Once | INTRAMUSCULAR | Status: DC | PRN
Start: 2017-06-15 — End: 2017-06-15
  Administered 2017-06-15: 2 mg via INTRAVENOUS

## 2017-06-15 MED ORDER — ACETAMINOPHEN 325 MG TABLET
650.0000 mg | ORAL_TABLET | Freq: Four times a day (QID) | ORAL | Status: DC | PRN
Start: 2017-06-15 — End: 2017-06-16

## 2017-06-15 MED ORDER — PHENYLEPHRINE 0.5 MG/5 ML (100 MCG/ML)IN 0.9 % SOD.CHLORIDE IV SYRINGE
INJECTION | Freq: Once | INTRAVENOUS | Status: DC | PRN
Start: 2017-06-15 — End: 2017-06-15
  Administered 2017-06-15: 100 ug via INTRAVENOUS
  Administered 2017-06-15: 200 ug via INTRAVENOUS
  Administered 2017-06-15: 100 ug via INTRAVENOUS
  Administered 2017-06-15: 200 ug via INTRAVENOUS
  Administered 2017-06-15: 100 ug via INTRAVENOUS

## 2017-06-15 MED ORDER — MINERAL OIL ENEMA
133.0000 mL | ENEMA | Freq: Every day | RECTAL | Status: DC | PRN
Start: 2017-06-15 — End: 2017-06-16

## 2017-06-15 MED ORDER — SODIUM CHLORIDE 0.9 % (FLUSH) INJECTION SYRINGE
2.0000 mL | INJECTION | INTRAMUSCULAR | Status: DC | PRN
Start: 2017-06-15 — End: 2017-06-16

## 2017-06-15 MED ORDER — SODIUM CHLORIDE 0.9 % IRRIGATION SOLUTION
3000.0000 mL | Status: DC | PRN
Start: 2017-06-15 — End: 2017-06-15
  Administered 2017-06-15: 3000 mL

## 2017-06-15 MED ORDER — METFORMIN 500 MG TABLET
1000.00 mg | ORAL_TABLET | Freq: Two times a day (BID) | ORAL | Status: DC
Start: 2017-06-16 — End: 2017-06-16
  Administered 2017-06-16: 1000 mg via ORAL
  Filled 2017-06-15 (×2): qty 2

## 2017-06-15 MED ORDER — HYDROMORPHONE 2 MG/ML INJECTION SYRINGE
INJECTION | Freq: Once | INTRAMUSCULAR | Status: DC | PRN
Start: 2017-06-15 — End: 2017-06-15
  Administered 2017-06-15: 1 mg via INTRAVENOUS

## 2017-06-15 MED ORDER — LEVOTHYROXINE 200 MCG TABLET
200.00 ug | ORAL_TABLET | Freq: Every morning | ORAL | Status: DC
Start: 2017-06-15 — End: 2017-06-16
  Administered 2017-06-16: 200 ug via ORAL
  Filled 2017-06-15 (×3): qty 1

## 2017-06-15 MED ORDER — SIMETHICONE 80 MG CHEWABLE TABLET
80.0000 mg | CHEWABLE_TABLET | Freq: Three times a day (TID) | ORAL | Status: DC | PRN
Start: 2017-06-15 — End: 2017-06-16
  Filled 2017-06-15: qty 1

## 2017-06-15 MED ORDER — ALCOHOL 62 % (NOZIN NASAL SANITIZER) NASAL SOLUTION
3.0000 | Freq: Once | NASAL | Status: AC
Start: 2017-06-15 — End: 2017-06-15
  Administered 2017-06-15: 3 via NASAL

## 2017-06-15 MED ORDER — METOPROLOL TARTRATE 12.5 MG HALF TAB
25.00 mg | ORAL_TABLET | Freq: Two times a day (BID) | ORAL | Status: DC
Start: 2017-06-15 — End: 2017-06-16
  Administered 2017-06-15 – 2017-06-16 (×2): 25 mg via ORAL
  Filled 2017-06-15 (×3): qty 2

## 2017-06-15 MED ORDER — ACETAMINOPHEN 1,000 MG/100 ML (10 MG/ML) INTRAVENOUS SOLUTION
Freq: Once | INTRAVENOUS | Status: DC | PRN
Start: 2017-06-15 — End: 2017-06-15
  Administered 2017-06-15: 1000 mg via INTRAVENOUS

## 2017-06-15 MED ORDER — HYDROXYCHLOROQUINE 200 MG TABLET
200.00 mg | ORAL_TABLET | Freq: Two times a day (BID) | ORAL | Status: DC
Start: 2017-06-15 — End: 2017-06-16
  Administered 2017-06-15 – 2017-06-16 (×2): 200 mg via ORAL
  Filled 2017-06-15 (×3): qty 1

## 2017-06-15 MED ORDER — SODIUM CHLORIDE 0.9 % INTRAVENOUS SOLUTION
Freq: Once | INTRAVENOUS | Status: AC
Start: 2017-06-15 — End: 2017-06-15
  Filled 2017-06-15: qty 60

## 2017-06-15 MED ORDER — SODIUM CHLORIDE 0.9 % IRRIGATION SOLUTION
1000.0000 mL | Status: DC | PRN
Start: 2017-06-15 — End: 2017-06-15
  Administered 2017-06-15: 1000 mL

## 2017-06-15 MED ORDER — LEVOTHYROXINE 50 MCG TABLET
50.00 ug | ORAL_TABLET | Freq: Every morning | ORAL | Status: DC
Start: 2017-06-15 — End: 2017-06-16
  Administered 2017-06-16: 50 ug via ORAL
  Filled 2017-06-15 (×3): qty 1

## 2017-06-15 MED ORDER — SODIUM CHLORIDE 0.9 % INTRAVENOUS SOLUTION
INTRAVENOUS | Status: DC
Start: 2017-06-15 — End: 2017-06-16

## 2017-06-15 MED ORDER — LACTATED RINGERS INTRAVENOUS SOLUTION
INTRAVENOUS | Status: DC
Start: 2017-06-15 — End: 2017-06-16

## 2017-06-15 MED ORDER — PROCHLORPERAZINE EDISYLATE 10 MG/2 ML (5 MG/ML) INJECTION SOLUTION
10.00 mg | Freq: Four times a day (QID) | INTRAMUSCULAR | Status: DC | PRN
Start: 2017-06-15 — End: 2017-06-15

## 2017-06-15 MED ORDER — OXYCODONE 5 MG TABLET
5.0000 mg | ORAL_TABLET | ORAL | Status: DC | PRN
Start: 2017-06-15 — End: 2017-06-16
  Administered 2017-06-16: 5 mg via ORAL
  Filled 2017-06-15: qty 1

## 2017-06-15 MED ORDER — MORPHINE 4 MG/ML INTRAVENOUS SOLUTION
4.0000 mg | INTRAVENOUS | Status: DC | PRN
Start: 2017-06-15 — End: 2017-06-16

## 2017-06-15 MED ORDER — BISACODYL 10 MG RECTAL SUPPOSITORY
10.0000 mg | Freq: Every day | RECTAL | Status: DC | PRN
Start: 2017-06-15 — End: 2017-06-16

## 2017-06-15 MED ORDER — SENNOSIDES 8.6 MG-DOCUSATE SODIUM 50 MG TABLET
1.0000 | ORAL_TABLET | Freq: Two times a day (BID) | ORAL | 0 refills | Status: DC
Start: 2017-06-15 — End: 2018-06-14

## 2017-06-15 MED ORDER — NALOXONE 0.4 MG/ML INJECTION SOLUTION
0.4000 mg | Freq: Once | INTRAMUSCULAR | Status: DC | PRN
Start: 2017-06-15 — End: 2017-06-16

## 2017-06-15 MED ORDER — ONDANSETRON HCL 4 MG TABLET
4.0000 mg | ORAL_TABLET | Freq: Three times a day (TID) | ORAL | 0 refills | Status: DC | PRN
Start: 2017-06-15 — End: 2018-06-12

## 2017-06-15 MED ORDER — LACTATED RINGERS INTRAVENOUS SOLUTION
INTRAVENOUS | Status: DC
Start: 2017-06-15 — End: 2017-06-15

## 2017-06-15 MED ORDER — EPHEDRINE SULFATE 50 MG/ML INJECTION SOLUTION
Freq: Once | INTRAMUSCULAR | Status: DC | PRN
Start: 2017-06-15 — End: 2017-06-15
  Administered 2017-06-15 (×3): 5 mg via INTRAVENOUS

## 2017-06-15 MED ORDER — SULFASALAZINE 500 MG TABLET
500.00 mg | ORAL_TABLET | Freq: Two times a day (BID) | ORAL | Status: DC
Start: 2017-06-15 — End: 2017-06-16
  Administered 2017-06-15 (×2): 500 mg via ORAL
  Administered 2017-06-16: 0 mg via ORAL
  Filled 2017-06-15 (×3): qty 1

## 2017-06-15 MED ORDER — OXYCODONE 5 MG TABLET
ORAL_TABLET | ORAL | 0 refills | Status: DC
Start: 2017-06-15 — End: 2018-06-12

## 2017-06-15 MED ORDER — APREPITANT 40 MG CAPSULE
40.0000 mg | ORAL_CAPSULE | Freq: Once | ORAL | Status: AC
Start: 2017-06-15 — End: 2017-06-15
  Administered 2017-06-15: 40 mg via ORAL
  Filled 2017-06-15: qty 1

## 2017-06-15 MED ORDER — INSULIN LISPRO 100 UNIT/ML SUB-Q SSIP
2.00 [IU] | INJECTION | Freq: Four times a day (QID) | SUBCUTANEOUS | Status: DC | PRN
Start: 2017-06-15 — End: 2017-06-16
  Administered 2017-06-15: 6 [IU] via SUBCUTANEOUS
  Administered 2017-06-16: 2 [IU] via SUBCUTANEOUS
  Filled 2017-06-15: qty 3

## 2017-06-15 MED ORDER — SODIUM CHLORIDE 0.9 % (FLUSH) INJECTION SYRINGE
2.0000 mL | INJECTION | Freq: Three times a day (TID) | INTRAMUSCULAR | Status: DC
Start: 2017-06-15 — End: 2017-06-15

## 2017-06-15 MED ORDER — ONDANSETRON HCL (PF) 4 MG/2 ML INJECTION SOLUTION
Freq: Once | INTRAMUSCULAR | Status: DC | PRN
Start: 2017-06-15 — End: 2017-06-15
  Administered 2017-06-15: 4 mg via INTRAVENOUS

## 2017-06-15 MED ORDER — DEXAMETHASONE SODIUM PHOSPHATE 10 MG/ML INJECTION SOLUTION
10.0000 mg | Freq: Once | INTRAMUSCULAR | Status: AC
Start: 2017-06-15 — End: 2017-06-15
  Administered 2017-06-15: 8 mg via INTRAVENOUS

## 2017-06-15 MED ORDER — MAGNESIUM HYDROXIDE 400 MG/5 ML ORAL SUSPENSION
30.0000 mL | Freq: Two times a day (BID) | ORAL | Status: DC | PRN
Start: 2017-06-15 — End: 2017-06-16

## 2017-06-15 MED ORDER — LOSARTAN 50 MG TABLET
50.0000 mg | ORAL_TABLET | Freq: Every day | ORAL | Status: DC
Start: 2017-06-15 — End: 2017-06-16
  Administered 2017-06-16: 50 mg via ORAL
  Filled 2017-06-15 (×2): qty 1

## 2017-06-15 MED ORDER — OXYCODONE 5 MG TABLET
10.0000 mg | ORAL_TABLET | ORAL | Status: DC | PRN
Start: 2017-06-15 — End: 2017-06-16
  Administered 2017-06-16: 10 mg via ORAL
  Filled 2017-06-15: qty 2

## 2017-06-15 MED ORDER — RIVAROXABAN 10 MG TABLET
10.00 mg | ORAL_TABLET | Freq: Every evening | ORAL | Status: DC
Start: 2017-06-16 — End: 2017-06-16
  Filled 2017-06-15: qty 1

## 2017-06-15 MED ORDER — FENTANYL (PF) 50 MCG/ML INJECTION SOLUTION
Freq: Once | INTRAMUSCULAR | Status: DC | PRN
Start: 2017-06-15 — End: 2017-06-15
  Administered 2017-06-15: 100 ug via INTRAVENOUS
  Administered 2017-06-15 (×2): 50 ug via INTRAVENOUS

## 2017-06-15 MED ORDER — MORPHINE 2 MG/ML INTRAVENOUS SYRINGE
2.0000 mg | INJECTION | INTRAVENOUS | Status: DC | PRN
Start: 2017-06-15 — End: 2017-06-16

## 2017-06-15 MED ORDER — DIPHENHYDRAMINE 50 MG/ML INJECTION SOLUTION
25.0000 mg | Freq: Four times a day (QID) | INTRAMUSCULAR | Status: DC | PRN
Start: 2017-06-15 — End: 2017-06-16

## 2017-06-15 MED ORDER — FENTANYL (PF) 50 MCG/ML INJECTION SOLUTION
12.5000 ug | INTRAMUSCULAR | Status: DC | PRN
Start: 2017-06-15 — End: 2017-06-15

## 2017-06-15 MED ADMIN — sodium chloride 0.9 % intravenous solution: INTRAVENOUS | @ 13:00:00 | NDC 00338004904

## 2017-06-15 MED ADMIN — HYDROmorphone 2 mg/mL injection syringe: INTRAVENOUS | @ 14:00:00

## 2017-06-15 MED ADMIN — erythromycin 5 mg/gram (0.5 %) eye ointment: ORAL | @ 21:00:00 | NDC 17478082401

## 2017-06-15 MED ADMIN — ibuprofen 800 mg tablet: INTRAVENOUS | @ 13:00:00

## 2017-06-15 MED ADMIN — PEDS CUSTOM PARENTERAL NUTRITION - LESS THAN 6 MONTHS: ORAL | @ 21:00:00 | NDC 00338113003

## 2017-06-15 SURGICAL SUPPLY — 32 items
ADHESIVE TISSUE EXOFIN 1.0ML_PREMIERPRO EXOFIN (SEALANTS) ×1
BLADE SAW 77.6X11.2MM RECIPROCATE OFST HVDTY SS THK.77MM LONG STRL LF  DISP (SURGICAL CUTTING SUPPLIES) ×1 IMPLANT
BLADE SAW 77.6X11.2X.77MM RECI_PROCATE HVDTY SS LONG OFST (CUTTING ELEMENTS) ×1
CONV USE 338605 - PACK SURG T/H ARTHPL NONST DISP LF (CUSTOM TRAYS & PACK) ×1 IMPLANT
CONV USE ITEM 156524 - ADHESIVE TISSUE EXOFIN 1.0ML_PREMIERPRO EXOFIN (SEALANTS) ×1 IMPLANT
CONV USE ITEM 337890 - PACK SURG BSIN 2 STRL LF  DISP (CUSTOM TRAYS & PACK) ×1 IMPLANT
COVER WAND RFD STRL 50EA/CS_01-0020 (EQUIPMENT MINOR) ×1
COVER WND RF DETECT STRL CLR EQP (EQUIPMENT MINOR) ×1 IMPLANT
DEVICE CLSR VLOC 90 58CM PREM RVRS 2 ANG CUT ABS BARB PTRN SURGALLOY 4-0 P-12 3/8 CRC UNDYED (SUTURE/WOUND CLOSURE) ×1 IMPLANT
DEVICE CLSR VLOC 90 58CM PREM_RVRS 2 ANG CUT ABS BARB PTRN (SUTURE/WOUND CLOSURE) ×1
DISCONTINUED USE ITEM 307130 - GOWN SURG XL XLNG AAMI L4 IMPR_V BRTHBL HKLP CLSR STRL LF (PROTECTIVE PRODUCTS/GARMENTS) ×1
DISCONTINUED USE ITEM 342873 - GOWN SURG XL XLNG AAMI L4 IMPR_V BRTHBL HKLP CLSR STRL LF (PROTECTIVE PRODUCTS/GARMENTS) ×1 IMPLANT
DRESSING WND MEPILEX 4X8IN ST 29580001 BORDER STRL 5/BX (WOUND CARE SUPPLY) ×1 IMPLANT
DRESSING WND MEPILEX 4X8IN ST 29580001 BORDER STRL 5/BX (WOUND CARE/ENTEROSTOMAL SUPPLY) ×1
GOWN SURG XL XLNG AAMI L4 IMPR_V BRTHBL HKLP CLSR STRL LF (PROTECTIVE PRODUCTS/GARMENTS) ×1
HEAD ECHLN 36MM +4MM 12/14 TAPER HIP OXNM FEM STRL LF ×1 IMPLANT
HIP POR FEM OX HD R3 SHELL_R3 XLPE LN 71703318 (JOINT COMMPONENT) ×1 IMPLANT
HOOD SRG FLYTE STRSHLD SURGICO_OL PLWY FACE SHLD VIR BRR LTWT (DGOW) ×1
HOOD SRG FLYTE SURGICOOL PLWY STRL LF  DISP (DGOW) ×1 IMPLANT
LINER ACET R3 20D 58MM 36MM XLPE HIP STRL ×1 IMPLANT
NEEDLE SPINAL YW 3.5IN 20GA QUINCKE REG WL POLYPROP STRL LF  DISP (ANETHESIA SUPPLIES) ×1 IMPLANT
NEEDLE SPINAL YW 3.5IN 20GA QU_INCKE REG WL POLYPROP REG BVL (ANETHESIA SUPPLIES) ×1
PACK BASIN DBL CUSTOM (CUSTOM TRAYS & PACK) ×1
PACK CUSTOM HIP ARTHROPLASTY (CUSTOM TRAYS & PACK) ×1
SHELL ACET 58MM HIP 3 H POLY POR R3 STD STRL ×1 IMPLANT
SOL IRRG 0.9% NACL 3L PLASTIC CONTAINR UROMATIC LF (SOLUTIONS) ×1 IMPLANT
SOLUTION IRRG NS 3000CC 2B7127_4/CS (SOLUTIONS) ×1
SUTURE 1 CT1 VICRYL+ 27IN UNDYED BRD ANBCTRL COAT ABS (SUTURE/WOUND CLOSURE) ×2 IMPLANT
SUTURE 1 CT1 VICRYL+ 27IN UNDY_ED BRD ANBCTRL COAT ABS (SUTURE/WOUND CLOSURE) ×2
SUTURE 2 CP VICRYL+ 27IN UNDYED BRD ANBCTRL COAT ABS (SUTURE/WOUND CLOSURE) ×3 IMPLANT
SUTURE 2 CP VICRYL+ 27IN UNDYE_D BRD ANBCTRL COAT ABS (SUTURE/WOUND CLOSURE) ×3
Synergy Porous High Offset Femoral Component ×1 IMPLANT

## 2017-06-15 NOTE — OR Surgeon (Signed)
PATIENT NAMEKACE, [name]   HOSPITAL NUMBER:  [id number]  DATE OF SERVICE: 06/15/2017  DATE OF BIRTH:  12-22-1955    OPERATIVE REPORT    PREOPERATIVE DIAGNOSIS:  Severe arthritis of the right hip and morbid obesity with a BMI (body mass index) of 41.    POSTOPERATIVE DIAGNOSIS:  Severe arthritis of the right hip and morbid obesity with a BMI (body mass index) of 41.    NAME OF PROCEDURE:  Right total hip arthroplasty, modifier -22 (+25% for a difficult total hip replacement associated with morbid obesity).    SURGEON:  Jacqualine Code, MD.    ASSISTANTS:  1. Brayton El, MD (fellow physician required for positioning, retraction, technical aspects of the surgical procedure and postop wound closure as no qualified resident physician was available).  2. Mauri Pole, PA-C.    ANESTHESIA:  Surgical spinal.    INDICATIONS FOR PROCEDURE:  [name] is a 62 year old gentleman with severe unremitting right hip pain of several months' duration.  He has rheumatoid arthritis.  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  An intra-articular injection provided substantial, but transient pain relief.  Total hip replacement surgery was offered.  Risks and benefits were reviewed.  Consent for the procedure was provided.  Clearance was provided.    DESCRIPTION OF PROCEDURE:  The patient was taken to the operating room on June 15, 2017.  He underwent a surgical spinal with bupivacaine.  A preoperative antibiotic was administered.  The patient was then positioned in the lateral decubitus position with the operative hip up.  A time-out was performed to ensure the correct side.  The operative extremity was then degreased with alcohol, prepped with DuraPrep and draped in the sterile fashion.  A timeout was repeated.  A curvilinear incision was created over the lateral hip by the assistant  surgeon.  Dissection was carried down to fascia using sharp dissection.  Bleeding points were identified and cauterized.  The fascia was then split along the length of the incision.  A self-retaining retractor was inserted.  The short external rotators were detached and tagged for later repair.  The hip capsule was opened and each corner tagged for later repair.  The femoral neck was exposed.  The hip was dislocated.  The femoral neck was osteotomized according to preoperative templating by the assistant surgeon.  An anterior capsulotomy was performed to enhance exposure.  The acetabulum was then exposed in a circumferential fashion.  The acetabular labrum was excised.  The acetabular teardrop was identified.  The acetabulum was then sequentially reamed with progressively larger reamers until a hemispherical bed of bleeding bone was encountered.  Satisfied with this, a trial acetabular component was impacted and found to be stable.  The final acetabular implant was selected and impacted into place.  Satisfied with the press-fit fixation, an appropriate-sized highly cross-linked polyethylene bearing was impacted into place and its stability assessed.  Satisfied with the acetabulum, attention was then turned to femoral preparation.  The femoral medullary canal was opened with a box-cutting chisel and T-handled canal finder.  The canal was then sequentially broached until axial and rotational stability was achieved.  Trial reductions were performed with various combinations of neck length and offset.  Once satisfied with stability, range of motion and leg length restoration, trial components were removed.  The final implant was selected and impacted down to the  level of the calcar.  Trial reduction was repeated with various length neck trials.  The final component was selected and inserted onto a clean and dry Morse taper.  The hip was reduced.  The wound was lavaged with pulsatile irrigation.  The fascia was closed  with a running #2 Quill suture and 0 Vicryl suture.  The subcutaneous tissue was approximated with 2-0 Vicryl suture.  The skin was closed with a running 4-0 V-Loc suture followed by Dermabond.  Dressings were applied.  The patient was awakened, extubated, and taken to recovery in satisfactory condition.    ESTIMATED BLOOD LOSS:  1000 mL.    COMPLICATIONS:  None.    SPECIMENS:  None.    OPERATIVE FINDINGS:  Severe osteoarthritis of the right hip as evidenced by bony sclerosis and osteophytosis.    FINAL COMPONENTS IMPLANTED:  Smith and Nephew size 58 R3 acetabular component with a high wall 36 mm polyethylene liner, size 16 high offset Synergy stem with a +4 x 36 mm Oxinium femoral head.        Jacqualine Code, MD  Assistant Professor   [place]            CC:   Sharrie Rothman, MD   [facility]   [address]   [place], [place] [address]   Fax: [phone]     DD:  06/15/2017 17:18:18  DT:  06/15/2017 20:15:04 DG  D#:  [id number]

## 2017-06-15 NOTE — Nurses Notes (Signed)
Patient was bladder scanned for 194 cc. Will continue to monitor.

## 2017-06-15 NOTE — Anesthesia Transfer of Care (Signed)
ANESTHESIA TRANSFER OF CARE   [name] is a 62 y.o. ,male,     had Procedure(s):  ARTHROPLASTY HIP TOTAL  performed  06/15/17   Primary Service: Jacqualine Code, *    Past Medical History:   Diagnosis Date   . Arthritis    . Back problem    . Bruises easily    . Cancer (CMS [place])    . Diabetes (CMS [place])    . Heartburn    . HTN (hypertension)    . Hyperlipidemia    . Hypothyroid    . Kidney disease    . MI (myocardial infarction) (CMS [place])    . Neck problem    . Rheumatoid arthritis (CMS [place])    . Thyroid cancer (CMS [place])    . Type 2 diabetes mellitus (CMS [facility])    . Wears glasses       Allergy History as of 06/15/17      No Known Allergies              I completed my transfer of care / handoff to the receiving personnel during which we discussed:   Last OR Temp: Temperature: 37 C (98.6 F)  ABG:  POTASSIUM   Date Value Ref Range Status   05/24/2017 4.7 3.5 - 5.1 mmol/L Final     CALCIUM   Date Value Ref Range Status   05/24/2017 9.4 8.5 - 10.2 mg/dL Final     Airway:   Blood pressure 93/65, pulse 82, temperature 37 C (98.6 F), resp. rate 12, SpO2 96 %.

## 2017-06-15 NOTE — Progress Notes (Signed)
[facility]  Progress Note    Date: 06/15/2017  Time: 15:11    SUBJECTIVE: Resting comfortably in PACU    OBJECTIVE:      Filed Vitals:    06/15/17 1445 06/15/17 1448 06/15/17 1500   BP:  93/65 93/65   Pulse:  82 (!) 101   Resp:  12 18   Temp: 36.8 C (98.2 F) 37 C (98.6 F) 36.5 C (97.7 F)   SpO2: 95% 96% 95%     --------------------------------------------------------------------------------------------------------------  Gen: NAD, comfortable  Lungs: unlabored breathing  Dressing/Splint: c/d/i  RLE: +2 DP pulse, toes wwp with bcr; ankle DF/PF and EHL/FHL intact; SILT distally in all nerves  ---------------------------------------------------------------------------------------------------------------    ASSESSMENT:  62 y.o. male s/p R THA with Dr Caryl Comes on 3/13.      PLAN:  - Weightbearing: RLE WBAT  - PT/OT: ordered; awaiting recs  - DVT prophylaxis: SCDs; Xarelto  - Antibiotics: 24 hrs ancef  - Pain: po, IV   - Dressing: located R hip, ortho to manage  - Labs: AM labs  - Imaging: postop xray looks good  - Diet: reguolar  - Encourage OOB with assistance. Meals in chair.  - Nursing instructions: check for "held/pended" orders upon arrival to floor  - Dispo: d/c to floor when recovery area d/c criteria met  - Follow-up: will see back in Dr. Olin Pia clinic in 2 weeks.      Brayton El, MD 06/15/2017, 15:11  Orthopaedic Surgery        The patient's condition and plan of care was discussed with me.  The note above was personally reviewed and appended before signature.    Peyton Bottoms MD  Assistant Professor Orthopaedic Surgery  Pager [phone]

## 2017-06-15 NOTE — H&P (Signed)
[facility]  H&P Update Form    [name], [name], 62 y.o. male  Encounter Start Date:  06/15/2017  Inpatient Admission Date: 06/15/2017  Date of Birth:  09-27-1955    06/15/2017    STOP: IF H&P IS GREATER THAN 30 DAYS FROM SURGICAL DAY COMPLETE NEW H&P IS REQUIRED.     H & P updated the day of the procedure.  1.  H&P completed within 30 days of surgical procedure by Dr.Klein on  05/24/2017  and has been reviewed within 24 hours of the surgery, the patient has been examined, and no change has occured in the patients condition since the H&P was completed.       Change in medications: No      Last Menstrual Period: Not applicable      Comments:     2.  Patient continues to be appropiate candidate for planned surgical procedure. YES    Pedal pulses are palpable and equal to the other extremity.    Peroneal and tibial nerve function is completely intact.      Jacqualine Code, MD

## 2017-06-15 NOTE — Anesthesia Procedure Notes (Signed)
Neuraxial Block    Sedation        Blocks   Block: spinal               Technique:  Approach: midline        Needle level: L3-4  Skin Prep: chlorhexidine   Preanesthesia Checklist:    Position:     Skin Local:  Skin local: Lidocaine 1%   Spinal Needle:  Spinal needle:Pencan    Spinal Needle gauge: 24 G    Spinal needle length: 4 inch Spinal needle attempts: 3.  Epidural Needle:                Epidural Catheter:              Block Events:  Procedure Events:CSF   Test dose:             Medications    Dosing          NOTES  Block free text:1.60ml 0.75% bupiv given --- well tolerated  Performed by:   Performing Provider: Lenon Oms, MD   Authorizing provider: Lenon Oms, MD  Pt location: In OR

## 2017-06-15 NOTE — Brief Op Note (Signed)
[facility]                                                     BRIEF OPERATIVE NOTE    Patient Name: Boone, Gear Number: [id number]  Date of Service: 06/15/2017   Date of Birth: 1955-05-13    All elements must be documented.    Pre-Operative Diagnosis: DJD right hip, RA   Post-Operative Diagnosis: DJD right hip, RA  Procedure(s)/Description:  Right THA  Findings/Complexity (inherent to the procedure performed): morbid obesity with difficulty with exposure etc     Attending Surgeon: Caryl Comes  Assistant(s): Roxanna Mew, [place]    Anesthesia Type: Spinal  Estimated Blood Loss:  1000  Blood Given:  None  Fluids Given: Crystalloid  Complications (not routinely expected or not inherent to difficulty/nature of procedure): None  Characteristic Event (routinely expected or inherent to the difficulty/nature of the procedure): None  Did the use of current and/or prior Anticoagulants impact the outcome of the case? no  Wound Class: Clean Wound: Uninfected operative wounds in which no inflammation occurred    Tubes: None  Drains: None  Specimens/ Cultures: None  Implants: Tamala Julian and Nephew           Disposition: PACU - hemodynamically stable.  Condition: stable    Jacqualine Code, MD

## 2017-06-15 NOTE — Anesthesia Postprocedure Evaluation (Signed)
Anesthesia Post Op Evaluation    Patient: [name]  Procedure(s):  ARTHROPLASTY HIP TOTAL    Last Vitals:Temperature: 36.6 C (97.9 F) (06/15/17 1530)  Heart Rate: 91 (06/15/17 1530)  BP (Non-Invasive): 107/64 (06/15/17 1530)  Respiratory Rate: 19 (06/15/17 1530)  SpO2-1: 93 % (06/15/17 1530)  Pain Score (Numeric, Faces): 0 (06/15/17 1530)    Patient location during evaluation: PACU   Post-procedure handoff checklist completed    Patient participation: complete - patient participated  Level of consciousness: awake and alert and responsive to verbal stimuli  Pain management: adequate  Airway patency: patent  Anesthetic complications: no  Cardiovascular status: acceptable  Respiratory status: acceptable  Hydration status: acceptable  Patient post-procedure temperature: Pt Normothermic   PONV Status: Absent

## 2017-06-16 ENCOUNTER — Encounter (HOSPITAL_BASED_OUTPATIENT_CLINIC_OR_DEPARTMENT_OTHER): Payer: Self-pay | Admitting: Orthopaedic Surgery

## 2017-06-16 DIAGNOSIS — M1611 Unilateral primary osteoarthritis, right hip: Principal | ICD-10-CM

## 2017-06-16 LAB — BASIC METABOLIC PANEL
ANION GAP: 10 mmol/L (ref 4–13)
BUN/CREA RATIO: 17 (ref 6–22)
BUN: 27 mg/dL — ABNORMAL HIGH (ref 8–25)
CALCIUM: 8.5 mg/dL (ref 8.5–10.2)
CHLORIDE: 105 mmol/L (ref 96–111)
CO2 TOTAL: 21 mmol/L — ABNORMAL LOW (ref 22–32)
CREATININE: 1.58 mg/dL — ABNORMAL HIGH (ref 0.62–1.27)
CREATININE: 1.58 mg/dL — ABNORMAL HIGH (ref 0.62–1.27)
ESTIMATED GFR: 45 mL/min/1.73mˆ2 — ABNORMAL LOW (ref >59)
GLUCOSE: 196 mg/dL — ABNORMAL HIGH (ref 65–139)
POTASSIUM: 4.4 mmol/L (ref 3.5–5.1)
SODIUM: 136 mmol/L (ref 136–145)

## 2017-06-16 LAB — CBC
HCT: 34.6 % — ABNORMAL LOW (ref 36.7–47.0)
HGB: 11.7 g/dL — ABNORMAL LOW (ref 12.5–16.3)
MCH: 33.3 pg — ABNORMAL HIGH (ref 27.4–33.0)
MCHC: 33.9 g/dL (ref 32.5–35.8)
MCV: 98.2 fL (ref 78.0–100.0)
MPV: 9.5 fL (ref 7.5–11.5)
PLATELETS: 160 x10ˆ3/uL (ref 140–450)
RBC: 3.52 x10ˆ6/uL — ABNORMAL LOW (ref 4.06–5.63)
RDW: 13.8 % (ref 12.0–15.0)
WBC: 18.6 x10?3/uL — ABNORMAL HIGH (ref 3.5–11.0)
WBC: 18.6 x10ˆ3/uL — ABNORMAL HIGH (ref 3.5–11.0)

## 2017-06-16 LAB — POC BLOOD GLUCOSE (RESULTS)
GLUCOSE, POC: 198 mg/dl — ABNORMAL HIGH (ref 70–105)
GLUCOSE, POC: 136 mg/dl — ABNORMAL HIGH (ref 70–105)

## 2017-06-16 MED ORDER — ALCOHOL 62 % (NOZIN NASAL SANITIZER) NASAL SOLUTION
1.00 | Freq: Three times a day (TID) | NASAL | Status: AC
Start: 2017-06-16 — End: 2017-06-30

## 2017-06-16 MED ADMIN — sodium chloride 0.9 % (flush) injection syringe: @ 14:00:00

## 2017-06-16 MED ADMIN — bicarbonate hemodialysis solution no.9 K 4 mEq-Ca 2.5 mEq-Mg 1.5 mEq/L: ORAL | @ 09:00:00 | NDC 24571010506

## 2017-06-16 MED ADMIN — lanolin-oxyquin-pet, hydrophil topical ointment: ORAL | @ 09:00:00 | NDC 09999989257

## 2017-06-16 MED ADMIN — electrolyte-A intravenous solution: ORAL | @ 11:00:00 | NDC 00338022104

## 2017-06-16 NOTE — Care Plan (Signed)
[facility]  Physical Therapy Initial Evaluation    Patient Name: [name]  Date of Birth: 02/07/1956  Height: Height: 182.9 cm (6')  Weight: Weight: 134.5 kg (296 lb 8.3 oz)  Room/Bed: 19/A  Payor: [employer] / Plan: [employer] / Product Type: Non Managed Care /     Assessment:      [name] tolerated PT evaluation well on this date, was able to demonstrate functional transfers with SBA x1 as well as ambulate over functional household distance with FWW and SBA x1 as well. Anticipate will progress very well with therapy and be able to return home with assist once medically ready for d/c.    Discharge Needs:    Equipment Recommendation: none anticipated  Discharge Disposition: home with assist    JUSTIFICATION OF DISCHARGE RECOMMENDATION   Based on current diagnosis, functional performance prior to admission, and current functional performance, this patient requires continued PT services in home with assist in order to achieve significant functional improvements in these deficit areas: aerobic capacity/endurance, gait, locomotion, and balance, muscle performance, posture, ROM (range of motion).    Plan:   Current Intervention: balance training, bed mobility training, gait training, home exercise program, patient/family education, postural re-education, ROM (range of motion), stair training, strengthening, transfer training  To provide physical therapy services 2x/day, minimum of 5x/week  for duration of until discharge.    The risks/benefits of therapy have been discussed with the patient/caregiver and he/she is in agreement with the established plan of care.       Subjective & Objective        06/16/17 0347   Therapist Pager   PT Assigned/ Pager # [phone] [phone]   Rehab Session   Document Type evaluation   Total PT Minutes: 9   Patient Effort excellent   Symptoms Noted During/After Treatment none   General Information   Patient Profile Reviewed? yes   Onset of  Illness/Injury or Date of Surgery 06/15/17   Pertinent History of Current Functional Problem 62 y.o. male s/p R THA with Dr Caryl Comes on 3/13.   Medical Lines PIV Line   Respiratory Status room air   Existing Precautions/Restrictions fall precautions;full code;weight bearing restriction   Weight-bearing Status   Extremity Weight-bearing Status right lower extremity   Right Lower Extremity weight-bearing as tolerated (WBAT)   Mutuality/Individual Preferences   Individualized Care Needs OOB with FWW x1   Patient-Specific Goals (Include Timeframe) return to work, golf, get outside without hurting   Living Environment   Lives With spouse   Living Arrangements house   Home Assessment: No Problems Identified   Home Accessibility stairs to enter home   Number of Stairs to [place] 2   Transportation Available family or friend will provide   Functional Level Prior   Ambulation 1 - assistive equipment   Transferring 1 - assistive equipment   Toileting 0 - independent   Bathing 0 - independent   Dressing 0 - independent   Eating 0 - independent   Communication 0 - understands/communicates without difficulty   Swallowing 0-->swallows foods/liquids without difficulty   Self-Care   Usual Activity Tolerance good   Current Activity Tolerance moderate   Equipment Currently Used at Home yes   Equipment Currently Used at [employer] cane, Careers information officer, [place] to Hospital none   Pre Treatment Status   Pre Treatment Patient Status Patient sitting in bedside chair or w/c;Call light within reach;Telephone within reach;Nurse approved session  Support Present Pre Treatment  None   Communication Pre Treatment  Nurse   Cognitive Assessment/Interventions   Behavior/Mood Observations behavior appropriate to situation, WNL/WFL   Orientation Status oriented x 4   Attention WNL/WFL   Follows Commands WNL   Vital Signs   O2 Delivery Pre Treatment room air   O2 Delivery Post Treatment room air   Vitals Comment VSS   Pain Assessment     Pain Scale: Numbers, Pretreatment 0/10 - no pain   Pain Scale: Numbers, Post-Treatment 0/10 - no pain   RUE Assessment   RUE Assessment WFL- Within Functional Limits   LUE Assessment   LUE Assessment WFL- Within Functional Limits   RLE Assessment   RLE Assessment WFL- Within Functional Limits   LLE Assessment   LLE Assessment WFL- Within Functional Limits   Bed Mobility Assessment/Treatment   Supine-Sit Independence not tested   Sit to Supine, Independence not tested   Transfer Assessment/Treatment   Sit-Stand Independence stand-by assistance   Stand-Sit Independence stand-by assistance   Sit-Stand-Sit, Assist Device walker, front wheeled   Transfer Impairments balance impaired;coordination impaired;endurance;flexibility decreased;ROM decreased;strength decreased   Gait Assessment/Treatment   Independence  contact guard assist   Assistive Device  walker, front wheeled   Distance in Feet 260   Total Distance Ambulated 260   Gait Speed decreased   Deviations  cadence decreased;step length decreased   Maintain Weight Bearing Status able to maintain   Safety Issues  step length decreased;sequencing ability decreased   Impairments  balance impaired;coordination impaired;endurance;flexibility decreased;ROM decreased;strength decreased   Balance Skill Training   Comment with FWW   Sitting Balance: Static good balance   Sitting, Dynamic (Balance) good balance   Sit-to-Stand Balance fair + balance   Standing Balance: Static fair + balance   Standing Balance: Dynamic fair + balance   Systems Impairment Contributing to Balance Disturbance musculoskeletal   Identified Impairments Contributing to Balance Disturbance decreased ROM;decreased strength   Post Treatment Status   Post Treatment Patient Status Patient sitting in bedside chair or w/c;Call light within reach;Telephone within reach   Support Present Post Treatment  None   Communication Post Treatement Nurse   Plan of Care Review   Plan Of Care Reviewed With patient    Basic Mobility Am-PAC/6Clicks Score   Turning in bed without bedrails 3   Lying on back to sitting on edge of flat bed 3   Moving to and from a bed to a chair 3   Standing up from chair 3   Walk in room 3   Climbing 3-5 steps with railing 3   Raw Score total 18   Standardized (t-scale) score 41.05   CMS 0-100% Score 40.47   CMS Modifier CK   Physical Therapy Clinical Impression   Assessment [name] tolerated PT evaluation well on this date, was able to demonstrate functional transfers with SBA x1 as well as ambulate over functional household distance with FWW and SBA x1 as well. Anticipate will progress very well with therapy and be able to return home with assist once medically ready for d/c.   Patient/Family Goals Statement go home today   Criteria for Skilled Therapeutic yes;meets criteria;skilled treatment is necessary   Pathology/Pathophysiology Noted musculoskeletal   Impairments Found (describe specific impairments) aerobic capacity/endurance;gait, locomotion, and balance;muscle performance;posture;ROM (range of motion)   Functional Limitations in Following  self-care;home management   Disability: Inability to Perform community/leisure   Rehab Potential good, to achieve stated therapy goals   Therapy  Frequency 2x/day;minimum of 5x/week   Predicted Duration of Therapy Intervention (days/wks) until discharge   Anticipated Equipment Needs at Discharge (PT) none anticipated   Anticipated Discharge Disposition home with assist   Highest level of Mobility score   Exercise Level 8- Walked 250 feet or more   Evaluation Complexity Justification   Patient History: Co-morbidity/factors that impact Plan of Care 1-2 that impact Plan of Care   Examination Components 4 or more Exam elements addressed   Presentation Evolving: Symptoms, complaints, characteristics of condition changing &/or cognitive deficits present   Clinical Decision Making Moderate complexity   Evaluation Complexity Moderate complexity   Care Plan  Goals   PT Rehab Goals Bed Mobility Goal;Gait Training Goal;Stairs Training Goal;Transfer Training Goal   Bed Mobility Goal   Bed Mobility Goal, Date Established 06/16/17   Bed Mobility Goal, Time to Achieve by discharge   Bed Mobility Goal, Activity Type all bed mobility activities   Bed Mobility Goal, Independence Level modified independence   Gait Training  Goal, Distance to Achieve   Gait Training  Goal, Date Established 06/16/17   Gait Training  Goal, Time to Achieve by discharge   Gait Training  Goal, Independence Level modified independence   Gait Training  Goal, Assist Device least restricted assistive device   Gait Training  Goal, Distance to Achieve 300 ft   Stairs Training Goal   Stairs Training Goal, Date Established 06/16/17   Stairs Training Goal, Time to Achieve by discharge   Stairs Training Goal, Independence Level modified independence   Stairs Training Goal, Assist Device least restricted assistive device   Stairs Training Goal, Number of Stairs to Achieve 2   Transfer Training Goal   Transfer Training Goal, Date Established 06/16/17   Transfer Training Goal, Time to Achieve by discharge   Transfer Training Goal, Activity Type bed-to-chair/chair-to-bed;sit-to-stand/stand-to-sit   Transfer Training Goal, Independence Level modified independence   Transfer Training Goal, Assist Device least restrictrictive assistive device   Planned Therapy Interventions, PT Eval   Planned Therapy Interventions (PT) balance training;bed mobility training;gait training;home exercise program;patient/family education;postural re-education;ROM (range of motion);stair training;strengthening;transfer training       Therapist:   Rexford Maus, PT 08:39  Pager #: [phone]

## 2017-06-16 NOTE — Nurses Notes (Addendum)
Patient to be d/c to home. AVS reviewed with patient. Patient educated on activity/weight bearing status, diet, incision care/dressing changes, medications, nozin administration, importance of regular BM's, fall prevention, ss of infection, hip precautions, DVT prevention/detection, follow up appointments, and when to seek medical attention. Patient given additional pair of TED hose, dressing and nozin supplies. Patient verbalized understanding with no further questions at this time. PIV removed, catheter intact. Family at bedside for transportation. Prescription medications delivered to room by d/c pharmacy. Patient has FWW at home  Patient has all belongings.     1234: Patient left floor in wheel chair via transport.

## 2017-06-16 NOTE — Care Plan (Signed)
Patient is alert and oriented.  Patient is OOB with one assist and walker.  Patient's pain is being controlled with prn medications.  Patient denies having any numbness or tingling.  Patient's dressing is clean, dry, and intact.  Patient is tolerating IV antibiotics.  Patient has call bell at bedside.  Will continue to monitor patient.      Problem: Adult Inpatient Plan of Care  Goal: Plan of Care Review  Outcome: Ongoing (see interventions/notes)  Flowsheets (Taken 06/16/2017 0924)  Plan of Care Reviewed With: patient     Problem: Adult Inpatient Plan of Care  Goal: Patient-Specific Goal (Individualization)  Outcome: Ongoing (see interventions/notes)  Flowsheets (Taken 06/15/2017 2100)  Anxieties, Fears or Concerns: Is it ok to bend my knees while in bed?     Problem: Adult Inpatient Plan of Care  Goal: Absence of Hospital-Acquired Illness or Injury  Outcome: Ongoing (see interventions/notes)     Problem: Adult Inpatient Plan of Care  Goal: Optimal Comfort and Wellbeing  Outcome: Ongoing (see interventions/notes)     Problem: Adult Inpatient Plan of Care  Goal: Rounds/Family Conference  Outcome: Ongoing (see interventions/notes)

## 2017-06-16 NOTE — Progress Notes (Signed)
[place]  [facility]  Progress Note    Date: 06/16/2017  Time: 07:34    SUBJECTIVE: Sitting up in chair comfortably; complains of mild pain in the operative hip, but denies numbness or tingling; has been tolerating PO and voiding    OBJECTIVE:      Filed Vitals:    06/15/17 2046 06/15/17 2321 06/16/17 0000 06/16/17 0354   BP: 109/73 (!) 93/59  108/63   Pulse: 93 77  73   Resp: 18 18  18    Temp: 36.7 C (98.1 F) 36.6 C (97.9 F)  36.7 C (98.1 F)   SpO2:   92%      --------------------------------------------------------------------------------------------------------------  Gen: NAD, comfortable  Lungs: unlabored breathing  Dressing/Splint: c/d/i  RLE: +2 DP pulse, toes wwp with bcr; ankle DF/PF and EHL/FHL intact; SILT distally in all nerves  ---------------------------------------------------------------------------------------------------------------    Results for orders placed or performed during the hospital encounter of 06/15/17 (from the past 24 hour(s))   BASIC METABOLIC PANEL, NON-FASTING - POST OP DAY 1   Result Value Ref Range    SODIUM 136 136 - 145 mmol/L    POTASSIUM 4.4 3.5 - 5.1 mmol/L    CHLORIDE 105 96 - 111 mmol/L    CO2 TOTAL 21 (L) 22 - 32 mmol/L    ANION GAP 10 4 - 13 mmol/L    CALCIUM 8.5 8.5 - 10.2 mg/dL    GLUCOSE 196 (H) 65 - 139 mg/dL    BUN 27 (H) 8 - 25 mg/dL    CREATININE 1.58 (H) 0.62 - 1.27 mg/dL    BUN/CREA RATIO 17 6 - 22    ESTIMATED GFR 45 (L) >59 mL/min/1.63m^2   CBC - POST OP DAY 1   Result Value Ref Range    WBC 18.6 (H) 3.5 - 11.0 x10^3/uL    RBC 3.52 (L) 4.06 - 5.63 x10^6/uL    HGB 11.7 (L) 12.5 - 16.3 g/dL    HCT 34.6 (L) 36.7 - 47.0 %    MCV 98.2 78.0 - 100.0 fL    MCH 33.3 (H) 27.4 - 33.0 pg    MCHC 33.9 32.5 - 35.8 g/dL    RDW 13.8 12.0 - 15.0 %    PLATELETS 160 140 - 450 x10^3/uL    MPV 9.5 7.5 - 11.5 fL   POC BLOOD GLUCOSE (RESULTS)   Result Value Ref Range    GLUCOSE, POC 198 (H) 70 - 105 mg/dl   POC BLOOD GLUCOSE (RESULTS)   Result Value Ref  Range    GLUCOSE, POC 136 (H) 70 - 105 mg/dl       ASSESSMENT:  62 y.o. male s/p R THA with Dr Caryl Comes on 3/13.      PLAN:  - Weightbearing: RLE WBAT  - PT/OT: ordered; awaiting recs  - DVT prophylaxis: SCDs; Xarelto  - Pain: po, IV   - Dressing: located R hip, ortho to manage  - Labs: AM labs reviewed; WBC 18.6 sepsis vs reactive; will monitor  - Imaging: postop xray looks good  - Diet: reguolar  - Encourage OOB with assistance. Meals in chair.  - Dispo: d/c to home likely today  - Follow-up: will see back in Dr. Olin Pia clinic in 2 weeks.      Brayton El, MD 06/16/2017, 07:34  Orthopaedic Surgery        Post-operative leukocytosis - sepsis vs. reactive.  Will observe for now.  Likely due to decadron.  Acute post-operative blood loss anemia  without symptoms - will observe for now.  Acute on chronic kidney disease.  I personally rounded on the patient.  I examined the patient, reviewed vital signs, and reviewed laboratory data.  The above note has been reviewed and I concur with its findings.  Peyton Bottoms MD  Assistant Professor Orthopaedic Surgery  Pager [phone]

## 2017-06-16 NOTE — Discharge Summary (Signed)
[facility]  DISCHARGE SUMMARY    PATIENT NAME:  [name], [name]  MRN:  [id number]  DOB:  10/15/1955    ENCOUNTER DATE:  06/15/2017  INPATIENT ADMISSION DATE: 06/15/2017  DISCHARGE DATE:  06/16/2017    ATTENDING PHYSICIAN: Jacqualine Code, MD  SERVICE: Steva Colder  PRIMARY CARE PHYSICIAN: Sharrie Rothman, MD       PRIMARY DISCHARGE DIAGNOSIS:   Active Hospital Problems    Diagnosis Date Noted   . Osteoarthritis of right hip 06/15/2017      Resolved Hospital Problems   No resolved problems to display.     Active Non-Hospital Problems    Diagnosis Date Noted   . Primary osteoarthritis of right hip 05/24/2017   . 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 Rheumatoid arthritis involving right hip (CMS HCC) 05/24/2017        DISCHARGE MEDICATIONS:     Current Discharge Medication List      START taking these medications.      Details   alcohol 62% Solution  Commonly known as:  NOZIN NASAL SANITIZER   1 Each, Each Nostril, EVERY 8 HOURS (SCHEDULED)  Refills:  0     ondansetron 4 mg Tablet  Commonly known as:  ZOFRAN   4 mg, Oral, EVERY 8 HOURS PRN  Qty:  20 Tab  Refills:  0     oxyCODONE 5 mg Tablet  Commonly known as:  ROXICODONE   Take 1-2 tablets by mouth every 4 hours as needed for pain.  Qty:  84 Tab  Refills:  0     rivaroxaban 10 mg Tablet  Commonly known as:  XARELTO   10 mg, Oral, DAILY  Qty:  30 Tab  Refills:  0     sennosides-docusate sodium 8.6-50 mg Tablet  Commonly known as:  SENOKOT-S   1 Tab, Oral, 2 TIMES DAILY  Qty:  60 Tab  Refills:  0        CONTINUE these medications - NO CHANGES were made during your visit.      Details   aspirin 81 mg Tablet, Delayed Release (E.C.)  Commonly known as:  ECOTRIN   81 mg, Oral, DAILY  Refills:  0     atorvastatin 40 mg Tablet  Commonly known as:  LIPITOR   40 mg, Oral, EVERY EVENING  Refills:  0     FISH OIL ORAL   1,000 mg, Oral, DAILY  Refills:  0     FOLIC ACID ORAL   1 mg, Oral, DAILY  Refills:  0     hydroxychloroquine  200 mg Tablet  Commonly known as:  PLAQUENIL   200 mg, Oral, 2 TIMES DAILY  Refills:  0     * levothyroxine 200 mcg Tablet  Commonly known as:  SYNTHROID   200 mcg, Oral, EVERY MORNING  Refills:  0     * levothyroxine 25 mcg Tablet  Commonly known as:  SYNTHROID   50 mcg, Oral, EVERY MORNING  Refills:  0     lidocaine (PF) 1% (10 mg/mL) Solution with methotrexate (PF) 25 mg/mL Solution 50 mg/m2   25 mg/m2, Intramuscular, Takes on saturdays   Refills:  0     losartan 50 mg Tablet  Commonly known as:  COZAAR   50 mg, Oral, DAILY, In am  Refills:  0     MetFORMIN 1,000 mg Tablet  Commonly known as:  GLUCOPHAGE   1,000 mg, Oral,  2 TIMES DAILY WITH FOOD  Refills:  0     metoprolol tartrate 25 mg Tablet  Commonly known as:  LOPRESSOR   25 mg, Oral, 2 TIMES DAILY  Refills:  0     sulfaSALAzine 500 mg Tablet  Commonly known as:  AZULFIDINE   500 mg, Oral, 2 TIMES DAILY  Refills:  0     VITAMIN C ORAL   500 mg, Oral, DAILY  Refills:  0     VITAMIN D-3 ORAL   Oral, 1000 IU daily  Refills:  0         * This list has 2 medication(s) that are the same as other medications prescribed for you. Read the directions carefully, and ask your doctor or other care provider to review them with you.              Discharge med list refreshed?  YES    ALLERGIES:  No Known Allergies            HOSPITAL PROCEDURE(S):   Bedside Procedures:  No orders of the defined types were placed in this encounter.    Surgical Procedure(s):  ARTHROPLASTY HIP TOTAL    REASON FOR HOSPITALIZATION AND HOSPITAL COURSE     BRIEF HPI:  This is a 62 y.o., male admitted for Right [place]: who underwent a right THA with Dr. Caryl Comes on 06/15/17.    Post-operative Summary:  Course: Unremarkable post-op course  Antibiotics: x24 hours post-op  Foley: None  Drain: None  Dressing: Left in place  Wound: c/d/i, no signs of infection  PT/OT: pt appropriate for d/c to Home  Additional Consults: None    Opioid Prescription - First Prescription  Diagnosis  requiring prescription: s/p Right THA    Medication Dosage/Frequency being prescribed: Roxicodone 5mg  1-2 tabs q4hours prn pain    I have reviewed prior medication history in the medical record for this patient.  I have also reviewed information contained in the state controlled prescription drug monitoring database.    I have discussed any history of non-pharmacological treatment with the patient.  The patient reports no prior treatment history.      The patient denies history of substance abuse treatment.      Physical exam findings and/or clinical history warranting use of opioid treatment include: Major surgery    My goals for treatment include: Pain control and resumption of ADLs    I have discussed the risk of opioid addiction with the patient.  I have also discussed the risk of using sedatives and alcohol while taking opioids.      CONDITION ON DISCHARGE:   General condition: pain controlled, tolerating PO intake, passing gas  Follow-up: Pt will f/u in 2 wks with Dr. Caryl Comes  Please review attached discharge instructions and call with any questions or concerns.      CONDITION ON DISCHARGE:  A. Ambulation: Ambulation with assistive device  B. Self-care Ability: Complete  C. Cognitive Status Alert and Oriented x 3  D. Code status at discharge:   Code Status Information     Code Status    Full Code                 LINES/DRAINS/WOUNDS AT DISCHARGE:   Patient Lines/Drains/Airways Status    Active Line / Dialysis Catheter / Dialysis Graft / Drain / Airway / Wound     Name: Placement date: Placement time: Site: Days:    Peripheral IV Left;Posterior Wrist  06/15/17   -   1    Surgical Incision Right;Lateral Hip  06/15/17   1306   less than 1                DISCHARGE DISPOSITION:  Home discharge              DISCHARGE INSTRUCTIONS:  Follow-up Information     [facility], [employer] .    Specialty:  Orthopaedics Joint Replacement  Contact information:  [place] [address]  [phone]  Additional information:  Below are driving directions to the [facility],  located within the [place], [place], [place]. If you need any additional information, please call [phone]) or you may visit our website at [email or web address] *From [address]:*[place] onto [address] ** From [address]: Take [address], [facility]. If traveling [place] on [address], keep right off exit ramp; if traveling [place] on [address], turn left at the end of the exit ramp. Continue on [address] for 0.7 miles and the [facility] building will be on your left, across from [place].* Valet parking is available to patients at [facility] for free and tipping is not required.                  DISCHARGE INSTRUCTION - NOZIN NASAL SWABBING    Note: Instructions apply to the patient and primary caregiver who will change the dressing daily. Swab each nostril 6 times clockwise and 6 times counterclockwise with a clean swab saturated with Nozin. Do this every 8 hours for 14 days from date of surgery.     DISCHARGE INSTRUCTION - DIET    Please resume a healthy, well-balanced diet upon discharge.     Diet: Z - other (specify in comments) Please resume a healthy, well-balanced diet upon discharge.     DISCHARGE INSTRUCTION - ACTIVITY/ WEIGHT BEARING    Weight Bearing Status: right Lower Extremity    WEIGHT BEARING AS TOLERATED: When you stand or walk, place only as much weight as feels comfortable on your leg. Let pain be your guide. If you feel pain, place less weight on the leg.    -You May use crutches or a walker until further notice.    - You may switch to the use of a cane.     DISCHARGE INSTRUCTION - DVT (BLOOD CLOTS) PREVENTION    -Swelling of the operative leg is normal after discharge because you will be more active at home. If however you develop new pain in the groin, thigh, or calf  and if swelling is unrelieved with elevation, this may be a sign of a DVT (blood clot). Please notify your surgeon immediately if this occurs or go the nearest ER to have an ultrasound of your leg.    -Bruising of the operative leg is normal and sometimes the bruising travels all the way to the ankle, foot and toes.  This will gradually go from purple to green to yellow before it disappears completely.      -Additional DVT prevention will be TED hose (stockings) for 4 weeks. These should be worn for at least 18 hours per day. They may be washed if they become soiled.    -You are to take the VTE prophylaxis (blood clot prevention) listed below for 30 days following your surgery.  - Rivaroxaban is being used to prevent DVT (blood  clots). Please take Rivaroxaban 10 mg every evening for 30 days. Please attempt to take this medication at the same time every day.     DISCHARGE INSTRUCTION - TUB/ SHOWER BENCH    Consider purchasing a tub/shower bench, usually NOT covered by insurance company.     DISCHARGE INSTRUCTION - INCISION/WOUND CARE    STANDARD INSTRUCTIONS- Change the bandage daily with dry gauze. Once the bandage is clean and dry for two consecutive days you may leave the incision uncovered and may shower and wet the incision. You may NOT submerge the incision until told otherwise by your surgeon (no swimming or tub bathing).  - Do not apply creams, lotions, salves or medicated ointments to the incision.  Simply apply dry gauze and nothing more.  - Please call me or your orthopedic nurse clinician for ANY DRAINAGE that occurs more than 7 days after surgery     Instructions for incision/wound care: Wound Care as Instructed      DISCHARGE INSTRUCTION - EXERCISE AND RESTRICTIONS    - No driving until further notice. The decision to commence driving will be decided at your first postoperative visit.  - Resume your usual home medications when you get home unless otherwise indicated at the time of discharge.  - Smoking  raises one's risk of post-operative infection, wound healing problems, and DVT (blood clots). If you are a smoker and cannot quit permanently, it is best that you wait at least four weeks from surgery before you resume smoking.  HIP REPLACEMENT:  -Observe hip dislocation precautions as instructed for the first 6 weeks.  Please refer to the physical therapist's instructions and therapy diagrams in your playbook or on our website at : [email or web address]   - -Posterior hip precautions as follows:   1. Do not sit in low chair or commode   2. Do not cross your legs   3. Do not allow your operative knee to cross behind the opposite knee   4. You may sleep on either side as long as there is a pillow between your legs   5. You are always safest with your legs apart and your feet turned out  - Physical therapy is rarely necessary after total hip replacement.  The only exercise we recommend is walking, and gluteal, quadriceps and calf contractions.  These do not require the supervision by a physical therapist.  - No weightlifting (resistive exercise) with the operative leg for the first 6 weeks.  - No straight leg raising exercise after total hip replacement for 6 weeks as it causes groin pain.     DISCHARGE INSTRUCTION - FALL PREVENTION    A fall after surgery can be devastating.  Falls can result in dislocation, rupturing a ligament or tendon, or breaking a bone around the new prosthesis. These injuries can be extremely difficult to repair and often require a revision of the entire prosthesis.  Revision operations are associated with a higher complication rate.  Therefore it is crucial that you avoid falling after surgery.  Many falls are caused by poor lighting, a throw rug, a small pet, or too much haste.  Please take time to remove obstacles, secure pets somewhere safe, and improve lighting at home with the use of properly placed nightlights.     DISCHARGE INSTRUCTION - SIGNS/SYMPTOMS OF POST OPERATIVE INFECTION     -Increasing pain, redness, warmth, shaking chills, sweats or fever may be a sign of a post-operative infection.  -A fever is not uncommon the first three  days after surgery. A fever of 100.5 or higher more than 72 hours after surgery is a concern and you should notify us if this occurs.  - Please inspect the incision every day. A little spotting on the dressing is normal. Drainage from the incision that persists more than 7 days after surgery may be a sign of infection and requires my immediate attention.     - If you have questions or concerns please call your orthopedic nurse clinician at [phone] or call [phone] and ask to speak to me (or the resident on call after hours or on weekends).  -No one should place you on an antibiotic for signs of a surgical site infection without first consulting the surgeon.   - No dental procedures for 6 weeks as this can cause infection in your new joint replacement  - You must receive antibiotics immediately prior to dental visits and procedures like colonoscopy and cystoscopy. This is a life-long recommendation.     DISCHARGE INSTRUCTION - POST-OPERATIVE PAIN    Ice may be applied 20 minutes at a time every 2 hours as needed.  Be careful not to apply ice to bare skin as ice may cause burns just like heat.  - PRESCRIPTION NARCOTIC PAIN RELIEVERS ARE HABIT FORMING.  THEY SHOULD BE RESERVED FOR MODERATE TO SEVERE PAIN.  - Prescription pain relievers are constipating for most people.  Be prepared with over the counter remedies like Milk of Magnesia, Prune Juice, Hot Tea, etc.  Fleets enemas and suppositories are available over the counter as well.     PLEASE KEEP FOLLOW-UP APPT ALREADY SCHEDULED IN [facility]    Please keep your appointment with your follow-up provider. If for some reason you need to cancel, be sure to reschedule as this is important for your care.     Department [facility] [[id number]]           Bud Face, PA-C      Copies sent to Care Team        Relationship Specialty Notifications Start End    Sharrie Rothman, MD PCP - General EXTERNAL  04/07/17     Phone: [phone] Fax: [phone]         [place] [address]          Referring providers can utilize [email or web address] to access their referred [facility] patient's information.              I personally reviewed the post-hospital care with the patient at the time of discharge.  Very specific instructions were reviewed with the patient in person and issued to the patient in print form.  All questions were answered and instructions regarding follow up were made.  The patient was encouraged to call my office with any questions or concerns.    Peyton Bottoms MD  Assistant Professor Orthopaedic Surgery  [phone]

## 2017-06-16 NOTE — Anticoag (Signed)
Met with patient to complete rivaroxaban (Xarelto) education and to discuss rivaroxaban medication guide. Discussed indication for therapy, once daily administration, potential medication interactions, potential side effects/adverse events, signs and symptoms of bleeding, what to do if bleeding or injury occurs, and the importance of notifying all providers of rivaroxaban therapy. Patient verbalized a good understanding of teaching.

## 2017-06-16 NOTE — Care Plan (Signed)
[facility]  Occupational Therapy Initial Evaluation    Patient Name: [name]  Date of Birth: 07-20-1955  Height: Height: 182.9 cm (6')  Weight: Weight: 134.5 kg (296 lb 8.3 oz)  Room/Bed: 19/A  Payor: [employer] / Plan: [employer] / Product Type: Non Managed Care /     Assessment:   Patient demonstrate good tolerance w/ OT Eval. Patient demonstrated good tolerance w/ STS transfers using FWW, LB dressing with dressing equipment, fair + static/dynamic standing balance w/ FWW during OT eval. Patient educated on posterior hip precautions during functional transfers and LB dressing. Patient ADL performance limited by pain and ROM in hip d/t hip precautions and procedure. Patient lives with wife and already has adaptive equipment for showering transfers and has FWW. Patient educated and able to follow hip precautions w/ LB dressing. Recommend patient is safe to return home with assistance.    Discharge Needs:   Equipment Recommendation: none    Discharge Disposition: home with assist    JUSTIFICATION OF DISCHARGE RECOMMENDATION   Based on current diagnosis, functional performance prior to admission, and current functional performance, this patient requires continued OT services in home with assist  in order to achieve significant functional improvements.    Plan:   Current Intervention: ADL retraining, IADL retraining, balance training, bed mobility training, endurance training, ROM (range of motion), transfer training, strengthening    To provide Occupational therapy services minimum of 2x/week, until discharge.       The risks/benefits of therapy have been discussed with the patient/caregiver and he/she is in agreement with the established plan of care.       Subjective & Objective        06/16/17 5465   Therapist Pager   OT Assigned/ Pager # Donnella Sham   Rehab Session   Document Type evaluation   Total OT Minutes: 15   Patient Effort excellent   Symptoms Noted  During/After Treatment none   General Information   Patient Profile Reviewed? yes   Onset of Illness/Injury or Date of Surgery 06/15/17   Pertinent History of Current Functional Problem 62 y.o. male s/p R THA with Dr Caryl Comes on 3/13.   Medical Lines PIV Line   Respiratory Status room air   Existing Precautions/Restrictions fall precautions;full code;weight bearing restriction; Posterior hip precautions   Pre Treatment Status   Pre Treatment Patient Status Patient sitting in bedside chair or w/c;Call light within reach;Telephone within reach;Nurse approved session   Support Present Pre Treatment  None   Communication Pre Treatment  Nurse   Communication Pre Treatment Comment Nursing approved OT eval prior to start of session   Mutuality/Individual Preferences   Individualized Care Needs OOB w/ FWW x 1    Living Environment   Lives With spouse   Living Arrangements house   Home Accessibility stairs to enter home   Number of Stairs to [place] 2   Functional Level Prior   Ambulation 0 - independent   Transferring 0 - independent    Toileting 0 - independent   Bathing 0 - independent   Dressing 0 - independent   Eating 0 - independent   Communication 0 - understands/communicates without difficulty   Self-Care   Usual Activity Tolerance good   Current Activity Tolerance moderate   Equipment Currently Used at Home yes   Equipment Currently Used at Home cane, straight;walker, rolling   Equipment Brought to Hospital none   Pain Assessment   Pain  Scale: Numbers, Pretreatment 0/10 - no pain   Pain Scale: Numbers, Post-Treatment 3/10   Pain Location - Side Right   Pain Location - Orientation generalized   Pain Location hip   Coping/Psychosocial   Observed Emotional State cooperative   Verbalized Emotional State acceptance   Plan of Care Reviewed With patient   Cognitive Assessment/Interventions   Behavior/Mood Observations behavior appropriate to situation, WNL/WFL   Orientation Status    Attention WNL/WFL   Follows Commands  WNL   RUE Assessment   RUE Assessment WFL- Within Functional Limits   LUE Assessment   LUE Assessment WFL- Within Functional Limits   RLE Assessment   RLE Assessment Limited due to pain and posterior hip precautions.    LLE Assessment   LLE Assessment WFL- Within Functional Limits   Mobility Assessment/Training   Extremity Weight-bearing Status right lower extremity   Right Lower Extremity weight-bearing as tolerated (WBAT)   Bed Mobility Assessment/Treatment   Supine-Sit Independence not tested   Sit to Supine, Independence not tested   Transfer Assessment/Treatment   Sit-Stand Independence stand-by assistance   Stand-Sit Independence stand-by assistance   Sit-Stand-Sit, Assist Device walker, front wheeled   Transfer Impairments balance impaired;pain;ROM decreased;flexibility decreased;coordination impaired;endurance;strength decreased   Lower Body Dressing Assessment/Training   Assistive Devices reacher;sock-aid   Position sitting   DRESSING ASSESSED Doff Socks;Don Socks   ASSISTANCE REQUIRED DONNING Left sock;Right sock   ASSITANCE REQUIRED DOFFING  Right sock;Left sock   Independence Level  minimum assist (75% patient effort)   Impairments ROM decreased   Comment Patient educated on use of LB dressing equipment to demo LB dressing. Pt able to doff B socks using reacher and required Min A to don socks using sock aid.  Pt reports his wife is able to assist with self care PRN upon return home.  Pt verbalized understanding of how to use the shoe horn and bath sponge.    Balance Skill Training   Comment OOB w/ FWW.   Sitting Balance: Static good balance   Sitting, Dynamic (Balance) good balance   Sit-to-Stand Balance fair + balance   Standing Balance: Static fair + balance   Standing Balance: Dynamic fair + balance   Systems Impairment Contributing to Balance Disturbance musculoskeletal   Identified Impairments Contributing to Balance Disturbance decreased ROM;decreased strength   Post Treatment Status   Post  Treatment Patient Status Patient sitting in bedside chair or w/c;Telephone within reach;Call light within reach   Support Present Post Treatment  None   Care Plan Goals   OT Rehab Goals Bed Mobility Goal;LB Dressing Goal;Toileting Goal;Transfer Training Goal;Bathing Goal   Bathing Goal   Bathing Goal, Date Established 06/16/17   Bathing Goal, Time to Achieve by discharge   Bathing Goal, Independence Level modified independence   Bed Mobility Goal   Bed Mobility Goal, Date Established 06/16/17   Bed Mobility Goal, Time to Achieve by discharge   Bed Mobility Goal, Activity Type all bed mobility activities   Bed Mobility Goal, Independence Level independent   LB Dressing Goal   LB Dressing Goal, Date Established 06/16/17   LB Dressing Goal, Time to Achieve by discharge   LB Dressing Goal, Activity Type all lower body dressing tasks   LB Dressing Goal, Independence Level modified independence   Toileting Goal   Toileting Goal, Date Established 06/16/17   Toileting Goal, Time to Achieve by discharge   Toileting Goal, Activity Type all toileting tasks   Toileting Goal, Independence Level modified independence  Transfer Training Goal   Transfer Training Goal, Date Established 06/16/17   Transfer Training Goal, Time to Achieve by discharge   Transfer Training Goal, Activity Type all transfers   Transfer Training Goal, Independence Level modified independence   Planned Therapy Interventions, OT Eval   Planned Therapy Interventions ADL retraining;IADL retraining;balance training;bed mobility training;ROM (range of motion);transfer training;strengthening   Occupational Therapy Clinical Impression   Functional Level at Time of Session Patient demonstrate good tolerance w/ OT Eval. Patient demonstrated good tolerance w/ STS transfers using FWW, LB dressing with dressing equipment, fair + static/dynamic standing balance w/ FWW during OT eval. Patient educated on posterior hip precautions during functional transfers and LB  dressing. Patient ADL performance limited by pain and ROM in hip d/t hip precautions and procedure. Patient lives with wife and already has adaptive equipment for showering transfers and has FWW. Patient educated and able to follow hip precautions w/ LB dressing. Recommend patient is safe to return home with assistance.      Criteria for Skilled Therapeutic Interventions Met (OT) yes;meets criteria    Rehab Potential good, to achieve stated therapy goals   Therapy Frequency minimum of 2x/week   Predicted Duration of Therapy until discharge   Anticipated Equipment Needs at Discharge    Anticipated Discharge Disposition home with assist   Evaluation Complexity Justification   Occupational Profile Review Brief history   Performance Deficits 3-5 deficits;Range of motion;Endurance;Balance;Mobility;Strength   Clinical Decision Making Low analytic complexity   Evaluation Complexity Low       Therapist:   Ashok Pall, STUDENT OCCUPATIONAL THERAPY   Pager #: [phone]

## 2017-06-16 NOTE — Care Management Notes (Signed)
[facility] Management Initial Evaluation    Patient Name: [name]  Date of Birth: 06-19-1955  Sex: male  Date/Time of Admission: 06/15/2017  9:26 AM  Room/Bed: 19/A  Payor: [employer] / Plan: [employer] / Product Type: Non Managed Care /   Primary Care Providers:  Sharrie Rothman, MD, MD (General)    Pharmacy Info:   [facility]erred [facility] - [place], [place] - [address]    [address] [place] [address]    Phone: [phone] Fax: [phone]    Not a 24 hour pharmacy; exact hours not known        Emergency Contact Info:   Extended Emergency Contact Information  Primary Emergency Contact: Radene Journey  Mobile Phone: [phone]  Relation: Wife  Preferred language: English  Interpreter needed? 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 No   Care Management Plan   Discharge Planning Status initial meeting   Projected Discharge Date 06/16/17   CM will evaluate for rehabilitation potential yes   Patient choice offered to patient/family no   Form for patient choice reviewed/signed and on chart no   Patient aware of possible cost for ambulance transport?  No   Discharge Needs Assessment   Equipment Currently Used at Home cane, straight;walker, standard   Equipment Needed After Discharge none   Discharge Facility/Level of Care Needs Home (Patient/Family Member/other)(code 1)   Transportation Available car;family or friend will provide   Referral Information   Admission Type inpatient   Address Verified verified-no changes   Arrived From home or self-care   Insurance Verified  verified-no change   ADVANCE DIRECTIVES   Does the Patient have an Advance Directive? No, Information Offered and Refused   Patient Requests Assistance in Having Advance Directive Notarized. N/A   Employment/Financial   Patient has Prescription Coverage?  Yes   Financial Concerns none   Living Environment   Select an age group to open "lives with" row.  Adult   Lives With spouse   Living Arrangements house   Able to Return to Prior Arrangements yes   [place] Accessibility stairs to enter home   Legal Issues   Do you have a court appointed guardian/conservator? No   Patient Hand-Off   Clinical/Discharge Plan of Care Information Communicated to:  Clinical Care Coordinator   Living Environment   Number of Stairs to Enter Home 2   Met w pt at bedside, pt is s/p RTHA, lives w wife in a ranch style house w 2 STE and w/i shower, uses cane and crutches, wife can provide transportation and assist, DVT - xarelto, $412 first fill, pt will call insurance to check on this, PT/OT - no evals on file, dispo - per patient, home today.    Discharge Plan:  Home (Patient/Family Member/other) (code 1)      The patient will continue to be evaluated for developing discharge needs.  Case Manager: Elly Modena, [place] COORDINATOR  Phone: [phone]

## 2017-06-20 ENCOUNTER — Telehealth (HOSPITAL_BASED_OUTPATIENT_CLINIC_OR_DEPARTMENT_OTHER): Payer: Self-pay | Admitting: Orthopaedic Surgery

## 2017-06-20 NOTE — Telephone Encounter (Signed)
Called to see how the patient was doing.  They deny any fever chills or falling.  They are taking their Aspirin and wearing their TED hose.  Their incision is healing well and is without redness, edema or drainage.  They are aware of their upcoming apt and were encouraged to call with any issues or concerns

## 2017-06-21 ENCOUNTER — Other Ambulatory Visit (HOSPITAL_BASED_OUTPATIENT_CLINIC_OR_DEPARTMENT_OTHER): Payer: Self-pay | Admitting: Surgical

## 2017-06-23 ENCOUNTER — Ambulatory Visit (HOSPITAL_BASED_OUTPATIENT_CLINIC_OR_DEPARTMENT_OTHER): Payer: Self-pay | Admitting: Orthopaedic Surgery

## 2017-06-23 NOTE — Telephone Encounter (Signed)
fmla paper work completed and faxed with a successful transmission

## 2017-06-30 ENCOUNTER — Ambulatory Visit (HOSPITAL_BASED_OUTPATIENT_CLINIC_OR_DEPARTMENT_OTHER): Payer: PRIVATE HEALTH INSURANCE

## 2017-06-30 ENCOUNTER — Ambulatory Visit: Payer: PRIVATE HEALTH INSURANCE | Attending: Surgical | Admitting: Surgical

## 2017-06-30 ENCOUNTER — Encounter (HOSPITAL_BASED_OUTPATIENT_CLINIC_OR_DEPARTMENT_OTHER): Payer: Self-pay | Admitting: Surgical

## 2017-06-30 VITALS — BP 109/70 | HR 85 | Temp 97.3°F | Ht 72.48 in | Wt 291.9 lb

## 2017-06-30 DIAGNOSIS — Z7901 Long term (current) use of anticoagulants: Secondary | ICD-10-CM | POA: Insufficient documentation

## 2017-06-30 DIAGNOSIS — Z96641 Presence of right artificial hip joint: Secondary | ICD-10-CM | POA: Insufficient documentation

## 2017-06-30 DIAGNOSIS — Z471 Aftercare following joint replacement surgery: Secondary | ICD-10-CM | POA: Insufficient documentation

## 2017-06-30 DIAGNOSIS — Z96649 Presence of unspecified artificial hip joint: Secondary | ICD-10-CM

## 2017-06-30 NOTE — Progress Notes (Signed)
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  The patient reports 3/10 pain.  Patient denies fever, chills or sweats.  Patient denies chest pain or shortness of breath.  The patient is using Xarelto for DVT prophylaxis.    EXAM:    BP 109/70   Pulse 85   Temp 36.3 C (97.3 F) (Thermal Scan)   Ht 1.841 m (6' 0.48")   Wt 132.4 kg (291 lb 14.2 oz)   BMI 39.06 kg/m       On exam, there is no obvious sign of infection.  There is no drainage.  The incision appears to be healing nicely.  There is no obvious clinical sign DVT.  Leg lengths appear to be equal.  Peroneal and tibial nerve it intact.    IMPRESSION: Status post right total Hip replacement    PLAN:  Signs and symptoms of peri-prosthetic infection and thromboemobolism were re-iterated. Hip precautions were reinforced. The patient was encouraged to increase activity as tolerated.  Low impact activity was advised.  The incision had been closed with a running subcuticular stitch and skin glue.  The patient may shower and cleanse the incision but may not swim or soak in a tub until the sixth week after surgery. The patient will follow up in 4 weeks with a right hip series.  The patient was encouraged to contact us with questions or concerns.    This patient was seen independently in clinic.      Domingo Sep, PA-C  06/30/2017, 11:04      Cc:    Sharrie Rothman, MD  [facility] [address]  [place] [address]      The patient's condition and plan of care was discussed with me.  The note above was personally reviewed and appended before signature.    Peyton Bottoms MD  Assistant Professor Orthopaedic Surgery  Pager [phone]

## 2017-08-03 ENCOUNTER — Encounter (HOSPITAL_BASED_OUTPATIENT_CLINIC_OR_DEPARTMENT_OTHER): Payer: Self-pay

## 2017-08-04 ENCOUNTER — Encounter (HOSPITAL_BASED_OUTPATIENT_CLINIC_OR_DEPARTMENT_OTHER): Payer: Self-pay

## 2017-08-04 ENCOUNTER — Ambulatory Visit (HOSPITAL_BASED_OUTPATIENT_CLINIC_OR_DEPARTMENT_OTHER): Payer: PRIVATE HEALTH INSURANCE | Admitting: Orthopaedic Surgery

## 2017-08-04 ENCOUNTER — Encounter (HOSPITAL_BASED_OUTPATIENT_CLINIC_OR_DEPARTMENT_OTHER): Payer: Self-pay | Admitting: Orthopaedic Surgery

## 2017-08-04 ENCOUNTER — Ambulatory Visit
Admission: RE | Admit: 2017-08-04 | Discharge: 2017-08-04 | Disposition: A | Discharge status: Home / Self Care | Payer: PRIVATE HEALTH INSURANCE | Source: Ambulatory Visit | Attending: Orthopaedic Surgery | Admitting: Orthopaedic Surgery

## 2017-08-04 VITALS — BP 140/79 | HR 78 | Temp 97.7°F | Ht 72.99 in | Wt 288.6 lb

## 2017-08-04 DIAGNOSIS — M1612 Unilateral primary osteoarthritis, left hip: Secondary | ICD-10-CM

## 2017-08-04 DIAGNOSIS — Z96641 Presence of right artificial hip joint: Secondary | ICD-10-CM

## 2017-08-04 DIAGNOSIS — Z09 Encounter for follow-up examination after completed treatment for conditions other than malignant neoplasm: Secondary | ICD-10-CM

## 2017-08-04 DIAGNOSIS — Z9889 Other specified postprocedural states: Secondary | ICD-10-CM

## 2017-08-04 DIAGNOSIS — M2170 Unequal limb length (acquired), unspecified site: Secondary | ICD-10-CM | POA: Insufficient documentation

## 2017-08-04 DIAGNOSIS — Z471 Aftercare following joint replacement surgery: Secondary | ICD-10-CM | POA: Insufficient documentation

## 2017-08-04 NOTE — Addendum Note (Signed)
Addended by: Tyson Dense on: 08/04/2017 11:49 AM     Modules accepted: Orders

## 2017-08-04 NOTE — Progress Notes (Signed)
[facility]   DEPARTMENT OF ORTHOPAEDICS    SUBJECTIVE  [name] is a 62 y.o. male who returns to clinic today post operatively from a right total hip arthroplasty which was performed on 06/15/17.  The patient is doing well post operatively and does not have any complaints. The patient reports mild pain in his right hip and states that his pain is now a 2/10. The patient is currently using nothing for ambulation.  He is wearing a shoe lift in his left shoe. The patient is staying as active as possible and states that he is anxious to get back to work and golfing. Denies any drainage from the incision site. His left hip is also pain. It is the same pain he had in his right hip before THA. It is worse with increased activity. He is retiring in October. 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  Plans to retire in [place] and move to [place].  Feels the right leg is a bit longer than the left and wears a lift in the left shoe.  Not having much left hip pain.    REVIEW OF SYSTEMS  General: negative for fevers or night sweats  Cardiovascular: negative for chest pain  Respiratory: negative for shortness of breath  Gastrointestinal: negative for digestive problems  Genitourinary: negative for urinary problems  Musculoskeletal: positive for right hip pain, negative for knee pain     All other systems reviewed and negative    PHYSICAL EXAMINATION   BP 140/79   Pulse 78   Temp 36.5 C (97.7 F) (Thermal Scan)   Ht 1.854 m (6' 0.99")   Wt 130.9 kg (288 lb 9.3 oz)   BMI 38.08 kg/m   GEN:   NAD  EYES: Sclera white.  PULM:   Normal respiratory effort.    MS:   Right hip has 80 degrees flexion, 5 degrees internal rotation, 5 degrees external rotation, 10 degrees abduction, 5 degrees adduction.  Calves soft, supple, nontender and without erythema. Left hip has over 100 degrees flexion, painless rotation.  NEURO:   Alert and oriented to person, place and time.       PSYCHOSOCIAL:   Pleasant.  Normal affect.    WOUND/INCISION:   Wound margins intact and well healed.      RADIOGRAPHS  Radiographs were performed today and reviewed at this visit.    X-Ray of right hip was interpreted by MD in clinic to show well positioned and aligned implants that have not migrated when compared to prior films. No evidence of loosening, fractures or dislocation. Left hip has moderate arthritic changes with sclerosis and osteophytes present.  Limb lengths equal according to trans-ischial line but pelvis tilted to the right.    ASSESSMENT     ICD-10-CM    1. Follow-up examination after orthopedic surgery [facility] x-ray   2. 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 Primary osteoarthritis of left hip M16.12    4.  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 he is doing well post operatively and does not have any complaints at the present time. The patient is satisfied with the operation. The healing of the incision site and his ROM are appropriate at this time. The patient will need antibiotic prophylaxis before future procedures. We encouraged the patient to  continue to stay as active as possible. He can go back to golfing. He can return to work on 08/15/17. We will hold off on replacing the left hip because his pain is not yet severe. The patient was told that he will have a follow up visit scheduled in the [facility] in March 2020 at which time right hip x-rays will be taken. Orders were placed in the system. The patient was instructed to call with any new or concerning symptoms. All of the patient's questions were answered and he agreed to this plan.    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 See physician's assistant note for additional details.     Jacqualine Code, MD  [phone]

## 2017-09-15 ENCOUNTER — Encounter (HOSPITAL_BASED_OUTPATIENT_CLINIC_OR_DEPARTMENT_OTHER): Payer: Self-pay

## 2017-12-01 LAB — BASIC METABOLIC PANEL
BUN/CREAT RATIO: 18
BUN: 24
CARBON DIOXIDE: 21
CHLORIDE: 109
CREATININE: 1.34
CREATININE: 1.34
ESTIMATED GLOMERULAR FILTRATION RATE: 56
POTASSIUM: 6.1
SODIUM: 143

## 2018-01-26 ENCOUNTER — Encounter (HOSPITAL_BASED_OUTPATIENT_CLINIC_OR_DEPARTMENT_OTHER): Payer: Self-pay | Admitting: Orthopaedic Surgery

## 2018-01-26 ENCOUNTER — Ambulatory Visit (HOSPITAL_BASED_OUTPATIENT_CLINIC_OR_DEPARTMENT_OTHER): Payer: PRIVATE HEALTH INSURANCE | Admitting: Orthopaedic Surgery

## 2018-01-26 ENCOUNTER — Ambulatory Visit
Admission: RE | Admit: 2018-01-26 | Discharge: 2018-01-26 | Disposition: A | Discharge status: Home / Self Care | Payer: PRIVATE HEALTH INSURANCE | Source: Ambulatory Visit | Attending: Orthopaedic Surgery | Admitting: Orthopaedic Surgery

## 2018-01-26 VITALS — BP 151/76 | HR 76 | Temp 97.5°F | Ht 72.76 in | Wt 287.7 lb

## 2018-01-26 DIAGNOSIS — Z79899 Other long term (current) drug therapy: Secondary | ICD-10-CM | POA: Insufficient documentation

## 2018-01-26 DIAGNOSIS — M25562 Pain in left knee: Secondary | ICD-10-CM

## 2018-01-26 DIAGNOSIS — M069 Rheumatoid arthritis, unspecified: Secondary | ICD-10-CM | POA: Insufficient documentation

## 2018-01-26 DIAGNOSIS — M1712 Unilateral primary osteoarthritis, left knee: Secondary | ICD-10-CM

## 2018-01-26 DIAGNOSIS — Z96649 Presence of unspecified artificial hip joint: Secondary | ICD-10-CM

## 2018-01-26 MED ORDER — METHYLPREDNISOLONE ACETATE 40 MG/ML SUSPENSION FOR INJECTION
40.0000 mg | Freq: Once | INTRAMUSCULAR | 0 refills | Status: AC
Start: 2018-01-26 — End: 2018-01-26

## 2018-01-26 MED ORDER — LIDOCAINE (PF) 10 MG/ML (1 %) INJECTION SOLUTION
4.0000 mL | Freq: Once | INTRAMUSCULAR | 0 refills | Status: AC
Start: 2018-01-26 — End: 2018-01-26

## 2018-01-26 NOTE — Patient Instructions (Signed)
[facility] for Joint Replacement    In order to extend the life of your joint replacement, we recommend the following:    1) Low impact activity mainly.  Examples include walking, cycling, swimming, hiking and elliptical machine. Avoid running and jumping other than occasional short bursts.  It's okay to snow ski but I would avoid moguls and jumps.      2) Maintain a healthy weight.  The lighter you are, the easier it will be on the prosthesis.    3) There is a lifetime risk of infection around the prosthesis.  1% of prostheses per year become infected.  Infections are best avoided by staying healthy.  You are most prone to infection when the immune system is weakened as with exhaustion and stress.  Distant infections should be treated promptly.  These include abscessed teeth, bladder infections, boils and even pneumonia.      4) Infections have been known to follow dental procedures so we advise that you receive antibiotics before dental cleanings and dirty procedures.  If you are not allergic to Penicillin, we recommend Amoxicillin 2000 mg by mouth 60 minutes before dental cleanings and procedures.  If allergic to Penicillin, we advise Clindamycin 600 mg by mouth 60 minutes before dental cleanings and procedures.    5) Increasing pain, redness, warmth, swelling or any drainage is a sign of infection and should be evaluated by us immediately.  Absolutely no one is to start you on an antibiotic without first being evaluated by me or one of my colleagues.    Your implant is likely to set off a metal detector and you do not need an implant card to get through the airport.  Most screening today is done by body scanner (xray) so the airport security will actually see the implant.  If you do pass through a magnetometer (metal detector), the agent will still have to pat you down regardless of whether you carry an implant card.    Follow up schedule after joint replacement is critical to monitor the performance of  your joint replacement.  We would like to see you at your one year anniversary and then every other year thereafter.    Most importantly, be as active as you can be and maintain a healthy lifestyle.    Adam E. Klein MD  [facility] Department of Orthopaedic Surgery  [phone]

## 2018-01-26 NOTE — Procedures (Signed)
The pt was offered an intraarticular corticosteroid injection and provided verbal consent.  The left knee was injected with 4cc's of 1% lidocaine and 40 mg depomedrol using sterile technique. The patient was advised that the injection site is frequently tender the evening of the injection and that on some occasions, the joint pain is worse after the injection for the first 24 to 48 hours.  The patient was advised to alternate between ice and heat, and to use OTC pain relievers for the pain until it subsides.  The patient was also advised to contact the office if the pain becomes severe and cannot be controlled with the above remedies.  The patient was warned about facial flushing and warmth after the injection and to use Benadryl if this occurs.  Finally the patient was advised that it may take up to 72 hours for the injection to reach peak effectiveness.  The patient was advised to contact this office for follow up if the injection provided no significant pain relief.  The patient is a diabetic and was therefore warned that corticosteroid injections can frequently result in elevated serum glucose levels for the first 48 to 72 hours.  The patient was advised to contact the primary care physician if the serum glucose level rises above 300 mg/dl.    Peyton Bottoms MD  Assistant Professor Orthopaedic Surgery

## 2018-01-26 NOTE — Progress Notes (Signed)
[name] for Joint Replacement    The patient returns today s/p R THA on 06/15/17. He returned today with left knee pain. His pain is intermittent to the anterior left knee. This pain increases with stair climbing and walking long distances. He recently moved to [place], and flew in specifically to see Dr. Caryl Comes today. He rates the pain a 8/10. He denies any recent injury.        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  Respirations:  Normal/Unlabored  Pedal Pulses:  Normal  Tenderness:  To medial left knee with palpable osteophytosis   Effusion: absent  Abrasions to left anterior shin.  Left knee range of motion as follows:  Extension:  full  Flexion:  120  No crepitus    Imaging: Today's images of the left knee reveals mild to moderate jsn to medial compartment of the left knee.    R hip x-ray series is well aligned well fixed [employer] synergy stem and cup.  Offset increased.      Impression:   ENCOUNTER DIAGNOSES     ICD-10-CM   1. Left knee pain, unspecified chronicity M25.562   2. Status post hip replacement, unspecified laterality Z96.649       Plan:   Injected the left knee today  Follow up concerning the R hip March 2022 which will be his 3 year anniversary  Follow up as needed concerning the left knee although I suspect the left knee will require corticosteroid injection every few months   If he can make it until medicare age, he can get his total knee replacement in [facility]  I can help him find a  reputable ortho surgeon down that way  Radiographs needed at next visit:  Updated hip series annually and Updated knee series annually  He was encouraged to call or e-mail with questions or concerns      A shared visit was performed with the co-signing physician.  Domingo Sep, PA-C  01/26/2018, 13:42      Cc    Sharrie Rothman, MD  [facility] [place]  [place] [address]

## 2018-02-08 ENCOUNTER — Encounter (HOSPITAL_BASED_OUTPATIENT_CLINIC_OR_DEPARTMENT_OTHER): Payer: Self-pay | Admitting: Surgical

## 2018-02-19 ENCOUNTER — Encounter (HOSPITAL_BASED_OUTPATIENT_CLINIC_OR_DEPARTMENT_OTHER): Payer: Self-pay | Admitting: Orthopaedic Surgery

## 2018-02-20 ENCOUNTER — Other Ambulatory Visit (HOSPITAL_BASED_OUTPATIENT_CLINIC_OR_DEPARTMENT_OTHER): Payer: Self-pay | Admitting: Orthopaedic Surgery

## 2018-02-20 DIAGNOSIS — M25552 Pain in left hip: Secondary | ICD-10-CM | POA: Insufficient documentation

## 2018-03-15 LAB — BASIC METABOLIC PANEL
BUN/CREAT RATIO: 15
BUN: 23
CARBON DIOXIDE: 23
CHLORIDE: 110
CREATININE: 1.52
ESTIMATED GLOMERULAR FILTRATION RATE: 48
POTASSIUM: 4.8
SODIUM: 148

## 2018-03-16 ENCOUNTER — Encounter (HOSPITAL_BASED_OUTPATIENT_CLINIC_OR_DEPARTMENT_OTHER): Payer: Self-pay | Admitting: Orthopaedic Surgery

## 2018-03-16 ENCOUNTER — Ambulatory Visit (HOSPITAL_BASED_OUTPATIENT_CLINIC_OR_DEPARTMENT_OTHER): Payer: PRIVATE HEALTH INSURANCE | Admitting: Orthopaedic Surgery

## 2018-03-16 ENCOUNTER — Ambulatory Visit
Admission: RE | Admit: 2018-03-16 | Discharge: 2018-03-16 | Disposition: A | Discharge status: Home / Self Care | Payer: PRIVATE HEALTH INSURANCE | Source: Ambulatory Visit | Attending: Nuclear Radiology | Admitting: Nuclear Radiology

## 2018-03-16 ENCOUNTER — Encounter (HOSPITAL_COMMUNITY)
Admission: RE | Disposition: A | Discharge status: Home / Self Care | Payer: Self-pay | Source: Ambulatory Visit | Attending: Nuclear Radiology

## 2018-03-16 ENCOUNTER — Ambulatory Visit (HOSPITAL_COMMUNITY): Payer: PRIVATE HEALTH INSURANCE | Admitting: Nuclear Radiology

## 2018-03-16 VITALS — BP 148/73 | HR 84 | Temp 98.2°F | Ht 73.78 in | Wt 297.2 lb

## 2018-03-16 DIAGNOSIS — M25562 Pain in left knee: Secondary | ICD-10-CM

## 2018-03-16 DIAGNOSIS — M06322 Rheumatoid nodule, left elbow: Secondary | ICD-10-CM | POA: Insufficient documentation

## 2018-03-16 DIAGNOSIS — M1612 Unilateral primary osteoarthritis, left hip: Secondary | ICD-10-CM | POA: Insufficient documentation

## 2018-03-16 DIAGNOSIS — M06852 Other specified rheumatoid arthritis, left hip: Secondary | ICD-10-CM | POA: Insufficient documentation

## 2018-03-16 DIAGNOSIS — M25552 Pain in left hip: Secondary | ICD-10-CM

## 2018-03-16 SURGERY — IR ARTHROGRAM - HIP LEFT
Laterality: Left

## 2018-03-16 MED ORDER — TRIAMCINOLONE ACETONIDE 40 MG/ML SUSPENSION FOR INJECTION
INTRAMUSCULAR | Status: AC
Start: 2018-03-16 — End: 2018-03-16
  Filled 2018-03-16: qty 5

## 2018-03-16 MED ORDER — ROPIVACAINE (PF) 5 MG/ML (0.5 %) INJECTION SOLUTION
5.0000 mL | Freq: Once | INTRAMUSCULAR | Status: AC
Start: 2018-03-16 — End: 2018-03-16
  Administered 2018-03-16 (×2): 5 mL

## 2018-03-16 MED ORDER — IOPAMIDOL 250 MG IODINE/ML (51 %) INTRAVENOUS SOLUTION
1.00 mL | INTRAVENOUS | Status: AC
Start: 2018-03-16 — End: 2018-03-16
  Administered 2018-03-16 (×2): 1 mL via INTRA_ARTICULAR

## 2018-03-16 MED ORDER — ROPIVACAINE (PF) 5 MG/ML (0.5 %) INJECTION SOLUTION
INTRAMUSCULAR | Status: AC
Start: 2018-03-16 — End: 2018-03-16
  Filled 2018-03-16: qty 30

## 2018-03-16 MED ORDER — ROPIVACAINE (PF) 5 MG/ML (0.5 %) INJECTION SOLUTION
4.0000 mL | Freq: Once | INTRAMUSCULAR | Status: AC
Start: 2018-03-16 — End: 2018-03-16
  Administered 2018-03-16: 4 mL via INTRA_ARTICULAR

## 2018-03-16 MED ORDER — TRIAMCINOLONE ACETONIDE 40 MG/ML SUSPENSION FOR INJECTION
40.0000 mg | Freq: Once | INTRAMUSCULAR | Status: AC
Start: 2018-03-16 — End: 2018-03-16
  Administered 2018-03-16: 40 mg via INTRA_ARTICULAR

## 2018-03-16 MED ADMIN — ropivacaine (PF) 5 mg/mL (0.5 %) injection solution: @ 09:00:00

## 2018-03-16 SURGICAL SUPPLY — 3 items
CONV USE 316258 - TRAY MYLGR CLR 3.5IN 22GA SFTM_YL STD HUB SPINAL NEEDLE (KITS & TRAYS (DISPOSABLE)) ×2 IMPLANT
NEEDLE SPINAL YW 6IN 20GA QUINCKE LONG LGTH REG WL POLYPROP QUINCKE TIP STRL LF  DISP (ANETHESIA SUPPLIES) ×1 IMPLANT
NEEDLE SPINAL YW 6IN 20GA QUIN_CKE LONG LGTH REG WL POLYPROP (ANETHESIA SUPPLIES) ×1

## 2018-03-16 NOTE — Addendum Note (Signed)
Addended by: Manfred Arch on: 03/16/2018 10:52 AM     Modules accepted: Orders

## 2018-03-17 NOTE — Progress Notes (Signed)
PATIENT NAMECAMERON, [name]   HOSPITAL NUMBER:  [id number]  DATE OF SERVICE: 03/16/2018  DATE OF BIRTH:  Mar 01, 1956    PROGRESS NOTE    SUBJECTIVE:  [name] is a 62 year old male with left knee pain who returns to clinic for followup of a differential hip injection.  We had previously injected his left knee and he had no resolution of his pain.  He reports that this differential hip injection has relieved the majority of the pain in his antero-lateral L knee.  He is ambulating without difficulty at today's visit.  We previously replaced his right hip and he has no complaints with regard to that.    OBJECTIVE:  BP (!) 148/73   Pulse 84   Temp 36.8 C (98.2 F) (Thermal Scan)   Ht 1.874 m (6' 1.78")   Wt 134.8 kg (297 lb 2.9 oz)   BMI 38.38 kg/m      He is in no apparent distress.  Appears to be stated age.  Mood and affect congruent with his clinical situation.  He is normocephalic, atraumatic.  He has nonlabored breathing.  Symmetric chest rise.  He has 2+ radial pulse.  Normal rate and rhythm.  Abdomen not obese, nondistended.  On exam of the patient's left hip, he has minimal pain with flexion and internal rotation.  No skin abnormalities.  No pain about the knee.  No tenderness to palpation.  Intact ankle dorsiflexion, plantarflexion, EHL function. Sensation intact to light touch dorsal and plantar foot, 2+ dorsalis pedis pulses present.    IMAGING:  No new images taken.  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  He has mild knee arthritis, but given the fact that he has had significant relief with the hip injection, this would likely represent a referred pain from his hip.  We are going to see how long he gets benefit from his hip injection.  If  he would like another one after 3 months, we would be happy to order that for him.  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  He is agreeable to his plan of care.  All questions were answered.        Evans Lance, MD  Resident   [place] Department of Orthopaedics       Jacqualine Code, MD  Assistant Professor   [place] [facility] of Orthpaedics              DD:  03/16/2018 11:21:05  DT:  03/17/2018 08:23:10 DG  D#:  [id number]      Cc    Sharrie Rothman, MD  [place]  [place] [address]

## 2018-03-21 ENCOUNTER — Other Ambulatory Visit (HOSPITAL_BASED_OUTPATIENT_CLINIC_OR_DEPARTMENT_OTHER): Payer: Self-pay | Admitting: Surgical

## 2018-03-21 DIAGNOSIS — M1612 Unilateral primary osteoarthritis, left hip: Secondary | ICD-10-CM | POA: Insufficient documentation

## 2018-03-27 ENCOUNTER — Other Ambulatory Visit (HOSPITAL_BASED_OUTPATIENT_CLINIC_OR_DEPARTMENT_OTHER): Payer: Self-pay | Admitting: Surgical

## 2018-03-27 ENCOUNTER — Telehealth (HOSPITAL_BASED_OUTPATIENT_CLINIC_OR_DEPARTMENT_OTHER): Payer: Self-pay | Admitting: Surgical

## 2018-03-27 DIAGNOSIS — E119 Type 2 diabetes mellitus without complications: Secondary | ICD-10-CM

## 2018-03-27 DIAGNOSIS — N183 Chronic kidney disease, stage 3 unspecified (CMS HCC): Secondary | ICD-10-CM

## 2018-03-27 DIAGNOSIS — E079 Disorder of thyroid, unspecified: Secondary | ICD-10-CM

## 2018-03-27 NOTE — Telephone Encounter (Signed)
[facility]     Pt is needing to see endocrinology for thyroid removed and diabetic issues. However, they require a referral.   Could Dr Caryl Comes place an order for him?    Thanks       referral request     Dr Caryl Comes pt     Pt is requesting a referral to a kidney specialist as well   Can you do this for this pt Dr Lequita Halt     Thank You!            Called patient with no response. Left message making patient aware that these referrals were placed for him.  Domingo Sep, PA-C  03/27/2018, 14:10

## 2018-04-03 ENCOUNTER — Ambulatory Visit (HOSPITAL_BASED_OUTPATIENT_CLINIC_OR_DEPARTMENT_OTHER): Payer: Self-pay

## 2018-04-03 NOTE — Telephone Encounter (Signed)
Routing to [employer] for review  Randel Books, RN  04/03/2018, 08:43

## 2018-04-03 NOTE — Telephone Encounter (Signed)
Regarding: Appointment change request  ----- Message from [place] sent at 03/31/2018  2:06 PM EST -----  Daneil Dan, APRN,FNP-BC    Pt calling wanting to see if there is anyway for you to work him into your schedule on 06/13/18 or 06/14/18 as he has to fly in from [place] rather than having to fly in again in April and he already has appointments on those dates. Please call pt to advise.

## 2018-04-07 ENCOUNTER — Ambulatory Visit (HOSPITAL_BASED_OUTPATIENT_CLINIC_OR_DEPARTMENT_OTHER): Payer: Self-pay | Admitting: Family

## 2018-04-07 NOTE — Nursing Note (Signed)
Daneil Dan, APRN,FNP-BC  Elmon Kirschner, RN   Caller: Unspecified (1 week ago, 2:03 PM)            Add him on for 3/10 at 11:30 am      Attempted to contact patient. No answer, left message. Rescheduled patient NPV appointment from 07/27/18 at 3:00 to 06/13/18 at 11:30 per [place] instruction. Left office number for patient to call with any questions or concerns. Appointment reminder sent to patient's home address  Elmon Kirschner, RN  04/07/2018, 08:42

## 2018-04-07 NOTE — Telephone Encounter (Signed)
Triage responded to by other means  Elmon Kirschner, RN  04/07/2018, 08:40

## 2018-04-07 NOTE — Telephone Encounter (Signed)
Regarding: Appointment change request  ----- Message from [place] sent at 04/06/2018  3:13 PM EST -----  Daneil Dan, APRN,FNP-BC    Patient calling again, please advise     ----- Message from [place] sent at 03/31/2018  2:06 PM EST -----  Daneil Dan, APRN,FNP-BC    Pt calling wanting to see if there is anyway for you to work him into your schedule on 06/13/18 or 06/14/18 as he has to fly in from [place] rather than having to fly in again in April and he already has appointments on those dates. Please call pt to advise.

## 2018-05-10 ENCOUNTER — Encounter (HOSPITAL_BASED_OUTPATIENT_CLINIC_OR_DEPARTMENT_OTHER): Payer: Self-pay | Admitting: Orthopaedic Surgery

## 2018-05-10 ENCOUNTER — Other Ambulatory Visit (HOSPITAL_BASED_OUTPATIENT_CLINIC_OR_DEPARTMENT_OTHER): Payer: Self-pay | Admitting: Family

## 2018-05-10 ENCOUNTER — Other Ambulatory Visit (HOSPITAL_BASED_OUTPATIENT_CLINIC_OR_DEPARTMENT_OTHER): Payer: Self-pay | Admitting: Surgical

## 2018-05-10 ENCOUNTER — Encounter (HOSPITAL_BASED_OUTPATIENT_CLINIC_OR_DEPARTMENT_OTHER): Payer: Self-pay | Admitting: Family

## 2018-05-10 DIAGNOSIS — M1612 Unilateral primary osteoarthritis, left hip: Secondary | ICD-10-CM

## 2018-05-11 ENCOUNTER — Other Ambulatory Visit (DIALYSIS_UNIT_HOSPITAL): Payer: Self-pay | Admitting: Family

## 2018-05-11 DIAGNOSIS — N183 Chronic kidney disease, stage 3 unspecified (CMS HCC): Secondary | ICD-10-CM

## 2018-05-19 ENCOUNTER — Encounter (HOSPITAL_BASED_OUTPATIENT_CLINIC_OR_DEPARTMENT_OTHER): Payer: Self-pay | Admitting: Surgical

## 2018-05-19 ENCOUNTER — Other Ambulatory Visit (HOSPITAL_BASED_OUTPATIENT_CLINIC_OR_DEPARTMENT_OTHER): Payer: Self-pay | Admitting: Surgical

## 2018-05-19 ENCOUNTER — Other Ambulatory Visit (HOSPITAL_COMMUNITY): Payer: Self-pay | Admitting: Orthopaedic Surgery

## 2018-05-19 DIAGNOSIS — M069 Rheumatoid arthritis, unspecified: Secondary | ICD-10-CM

## 2018-05-19 DIAGNOSIS — M2550 Pain in unspecified joint: Secondary | ICD-10-CM

## 2018-05-25 LAB — BASIC METABOLIC PANEL
ALBUMIN: 4.3
BUN/CREAT RATIO: 21
BUN: 38
CALCIUM: 8.9
CARBON DIOXIDE: 21
CHLORIDE: 106
CREAT. URINE RANDOM: 105.2
CREATININE: 1.77
ESTIMATED GLOMERULAR FILTRATION RATE: 40
GLUCOSE,NONFAST: 121
HCT: 39.6
HGB: 13.3
PHOSPHORUS: 3.1
POTASSIUM: 5
PROTEIN, URINE, RANDOM: 5.6
PROTEIN/CREATININE RATIO RANDOM URINE: 53 mg/mg
SODIUM: 141

## 2018-05-31 ENCOUNTER — Ambulatory Visit (HOSPITAL_BASED_OUTPATIENT_CLINIC_OR_DEPARTMENT_OTHER): Payer: Self-pay | Admitting: INTERNAL MEDICINE-ENDOCRINOLOGY-DIABETES AND METABOLISM

## 2018-06-12 ENCOUNTER — Encounter (HOSPITAL_BASED_OUTPATIENT_CLINIC_OR_DEPARTMENT_OTHER): Payer: Self-pay | Admitting: Family Medicine

## 2018-06-12 ENCOUNTER — Ambulatory Visit
Admission: RE | Admit: 2018-06-12 | Discharge: 2018-06-12 | Disposition: A | Discharge status: Home / Self Care | Payer: PRIVATE HEALTH INSURANCE | Source: Ambulatory Visit | Attending: Nuclear Radiology | Admitting: Nuclear Radiology

## 2018-06-12 ENCOUNTER — Ambulatory Visit (HOSPITAL_BASED_OUTPATIENT_CLINIC_OR_DEPARTMENT_OTHER): Payer: PRIVATE HEALTH INSURANCE | Admitting: Family Medicine

## 2018-06-12 ENCOUNTER — Other Ambulatory Visit: Payer: Self-pay

## 2018-06-12 ENCOUNTER — Encounter (HOSPITAL_COMMUNITY)
Admission: RE | Disposition: A | Discharge status: Home / Self Care | Payer: Self-pay | Source: Ambulatory Visit | Attending: Nuclear Radiology

## 2018-06-12 ENCOUNTER — Ambulatory Visit (HOSPITAL_COMMUNITY): Payer: PRIVATE HEALTH INSURANCE | Admitting: Nuclear Radiology

## 2018-06-12 VITALS — BP 126/82 | HR 81 | Temp 97.7°F | Resp 18 | Ht 72.87 in | Wt 299.2 lb

## 2018-06-12 DIAGNOSIS — I10 Essential (primary) hypertension: Secondary | ICD-10-CM | POA: Insufficient documentation

## 2018-06-12 DIAGNOSIS — M1612 Unilateral primary osteoarthritis, left hip: Secondary | ICD-10-CM | POA: Insufficient documentation

## 2018-06-12 DIAGNOSIS — Z23 Encounter for immunization: Secondary | ICD-10-CM | POA: Insufficient documentation

## 2018-06-12 DIAGNOSIS — M069 Rheumatoid arthritis, unspecified: Secondary | ICD-10-CM | POA: Insufficient documentation

## 2018-06-12 DIAGNOSIS — E785 Hyperlipidemia, unspecified: Secondary | ICD-10-CM | POA: Insufficient documentation

## 2018-06-12 DIAGNOSIS — R1030 Lower abdominal pain, unspecified: Secondary | ICD-10-CM | POA: Insufficient documentation

## 2018-06-12 DIAGNOSIS — G8929 Other chronic pain: Secondary | ICD-10-CM | POA: Insufficient documentation

## 2018-06-12 DIAGNOSIS — E119 Type 2 diabetes mellitus without complications: Secondary | ICD-10-CM | POA: Insufficient documentation

## 2018-06-12 DIAGNOSIS — M25752 Osteophyte, left hip: Secondary | ICD-10-CM | POA: Insufficient documentation

## 2018-06-12 DIAGNOSIS — Z79899 Other long term (current) drug therapy: Secondary | ICD-10-CM | POA: Insufficient documentation

## 2018-06-12 DIAGNOSIS — R918 Other nonspecific abnormal finding of lung field: Secondary | ICD-10-CM

## 2018-06-12 SURGERY — IR ARTHROGRAM - HIP LEFT
Laterality: Left

## 2018-06-12 MED ORDER — TRIAMCINOLONE ACETONIDE 40 MG/ML SUSPENSION FOR INJECTION
40.0000 mg | Freq: Once | INTRAMUSCULAR | Status: AC
Start: 2018-06-12 — End: 2018-06-12
  Administered 2018-06-12: 40 mg via INTRA_ARTICULAR

## 2018-06-12 MED ORDER — ROPIVACAINE (PF) 5 MG/ML (0.5 %) INJECTION SOLUTION
4.0000 mL | Freq: Once | INTRAMUSCULAR | Status: AC
Start: 2018-06-12 — End: 2018-06-12
  Administered 2018-06-12: 4 mL via INTRA_ARTICULAR

## 2018-06-12 MED ORDER — TRIAMCINOLONE ACETONIDE 40 MG/ML SUSPENSION FOR INJECTION
INTRAMUSCULAR | Status: AC
Start: 2018-06-12 — End: 2018-06-12
  Filled 2018-06-12: qty 5

## 2018-06-12 MED ORDER — ROPIVACAINE (PF) 5 MG/ML (0.5 %) INJECTION SOLUTION
6.0000 mL | Freq: Once | INTRAMUSCULAR | Status: AC
Start: 2018-06-12 — End: 2018-06-12
  Administered 2018-06-12 (×2): 6 mL

## 2018-06-12 MED ORDER — IOVERSOL 320 MG IODINE/ML INTRAVENOUS SOLUTION
1.00 mL | INTRAVENOUS | Status: AC
Start: 2018-06-12 — End: 2018-06-12
  Administered 2018-06-12: 1 mL via INTRAMUSCULAR

## 2018-06-12 MED ORDER — ROPIVACAINE (PF) 5 MG/ML (0.5 %) INJECTION SOLUTION
INTRAMUSCULAR | Status: AC
Start: 2018-06-12 — End: 2018-06-12
  Filled 2018-06-12: qty 30

## 2018-06-12 MED ADMIN — ioversoL 320 mg iodine/mL intravenous solution: INTRAMUSCULAR | @ 12:00:00

## 2018-06-12 SURGICAL SUPPLY — 3 items
CONV USE 316258 - TRAY MYLGR CLR 3.5IN 22GA SFTM_YL STD HUB SPINAL NEEDLE (KITS & TRAYS (DISPOSABLE)) ×2 IMPLANT
NEEDLE SPINAL YW 6IN 20GA QUINCKE LONG LGTH REG WL POLYPROP QUINCKE TIP STRL LF  DISP (ANETHESIA SUPPLIES) ×1 IMPLANT
NEEDLE SPINAL YW 6IN 20GA QUIN_CKE LONG LGTH REG WL POLYPROP (ANETHESIA SUPPLIES) ×1

## 2018-06-12 NOTE — Progress Notes (Addendum)
Department of Family Medicine   Progress Note    [name]  MRN: [id number]  DOB: 1955-06-29  Date of Service: 06/12/2018    CHIEF COMPLAINT  Chief Complaint   Patient presents with   . Establish Care   . Rheumatoid Arthritis   . Diabetes       SUBJECTIVE  [name] is a 63 y.o. male who presents to clinic for establish care.       Patient has diabetes mellitus. Sugars are currently controlled. no hypoglycemic symptoms. Patient tolerating treatment regimen well and is compliant.     Patient has hypertension. Patient is tolerating treatment regimen without side effects. Blood pressures at home are well controlled.     Patient with hyperlipidemia. This is well controlled on medication therapy. Denies side effects including myalgia, joint pain, headaches. Patient doing well without complaints related to lipids today.     RA - active and stable. Doing well on medication. Tolerating well.       Review of Systems:  Constitutional: negative for fevers, chills  Eyes: negative for vision changes  Ears, nose, mouth, throat, and face: negative for earaches, nasal congestion, sore throat  Respiratory: negative for cough, shortness of breath  Cardiovascular: negative for chest pain, palpitations  Gastrointestinal: negative for reflux symptoms, nausea, vomiting, diarrhea, constipation  Genitourinary: negative for dysuria  Integument: negative for rash  Musculoskeletal: negative for joint pain  Neurological: negative for headaches, numbness/tingling  Behavioral/Psych: negative for anxiety and depression  Endocrine: negative for excessive thirst, excessive urination    Medications:   aspirin (ECOTRIN) 81 mg Oral Tablet, Delayed Release (E.C.), Take 81 mg by mouth Once a day  atorvastatin (LIPITOR) 40 mg Oral Tablet, Take 40 mg by mouth Every evening  calcium carbonate/vitamin D3 (VITAMIN D-3 ORAL), Take by mouth 2947 IU daily  FOLIC ACID ORAL, Take 1 mg by mouth Once a day   hydroxychloroquine (PLAQUENIL) 200 mg Oral Tablet,  Take 200 mg by mouth Once a day   levothyroxine (SYNTHROID) 200 mcg Oral Tablet, Take 237 mcg by mouth Every morning   levothyroxine (SYNTHROID) 25 mcg Oral Tablet, Take 75 mcg by mouth Every morning   lidocaine (PF) 1% (10 mg/mL) Solution with methotrexate (PF) 25 mg/mL Solution 50 mg/m2, 25 mg/m2 by Intramuscular route Takes on saturdays  losartan (COZAAR) 50 mg Oral Tablet, Take 50 mg by mouth Once a day In am  MetFORMIN (GLUCOPHAGE) 1,000 mg Oral Tablet, Take 50 mg by mouth Twice daily with food   methotrexate, PF, 15 mg/0.3 mL Subcutaneous Auto-Injector, 15 mg by Subcutaneous route Every 7 days  metoprolol tartrate (LOPRESSOR) 25 mg Oral Tablet, Take 25 mg by mouth Twice daily  sennosides-docusate sodium (SENOKOT-S) 8.6-50 mg Oral Tablet, Take 1 Tab by mouth Twice daily (Patient not taking: Reported on 06/30/2017)  ascorbic acid (VITAMIN C ORAL), Take 500 mg by mouth Once a day   omega-3 fatty acids (FISH OIL ORAL), Take 1,000 mg by mouth Once a day   ondansetron (ZOFRAN) 4 mg Oral Tablet, Take 1 Tab (4 mg total) by mouth Every 8 hours as needed for nausea/vomiting (Patient not taking: Reported on 06/30/2017)  oxyCODONE (ROXICODONE) 5 mg Oral Tablet, Take 1-2 tablets by mouth every 4 hours as needed for pain. (Patient not taking: Reported on 06/30/2017)  rivaroxaban (XARELTO) 10 mg Oral Tablet, Take 1 Tab (10 mg total) by mouth Once a day (Patient not taking: Reported on 08/04/2017)  sulfaSALAzine (AZULFIDINE) 500 mg Oral Tablet, Take 500 mg  by mouth Twice daily    No facility-administered medications prior to visit.       Allergies:   No Known Allergies          Past Medical History:   Diagnosis Date   . Arthritis    . Back problem    . Bruises easily    . Cancer (CMS [place])    . Diabetes (CMS [place])    . Heartburn    . HTN (hypertension)    . Hyperlipidemia    . Hypothyroid    . Kidney disease    . MI (myocardial infarction) (CMS [place])    . Neck problem    . Rheumatoid arthritis (CMS [place])    . Thyroid cancer (CMS [place])     . Type 2 diabetes mellitus (CMS [facility])    . Wears glasses      Social History     Tobacco Use   . Smoking status: Never Smoker   . Smokeless tobacco: Never Used   Substance Use Topics   . Alcohol use: Yes     Comment: 4 times a year   . Drug use: Never     Family Medical History:     Problem Relation (Age of Onset)    No Known Problems Mother, Father          OBJECTIVE  BP 126/82 (Site: Left, Patient Position: Sitting, Cuff Size: Adult)   Pulse 81   Temp 36.5 C (97.7 F) (Thermal Scan)   Resp 18   Ht 1.851 m (6' 0.87")   Wt 135.7 kg (299 lb 2.6 oz)   SpO2 98%   BMI 39.61 kg/m       General: no distress  HENT: TMs clear, mouth mucous membranes moist, pharynx without injection or exudate   Lungs: clear to auscultation bilaterally  Cardiovascular: RRR, no murmur  Abdomen: soft, non tender, bowel sounds present  Extremities: no cyanosis or edema  Skin: warm and dry, no rash  Neurologic: gait is normal, AOx3, CN 2-12 grossly intact  Psychiatric: normal affect and behavior    ASSESSMENT/PLAN  (I10) Essential hypertension  (primary encounter diagnosis)  Plan: well controlled  Continue current medication    (E78.5) Hyperlipidemia, unspecified hyperlipidemia type  Plan: stable  Continue medication  Labs ordered    (E11.9) Diabetes mellitus (CMS [place])  Plan: stable  Continue medication  Labs ordered     (M06.9) Rheumatoid arthritis, involving unspecified site, unspecified rheumatoid factor presence (CMS [place])  Plan: stable  Continue medication    Has known lung nodules; due for ct scan evaluation in September 2020. Will order    Follows with multiple specialists for which i am happy about. Has these docs taking care of things. Has endo, cardiology, rheum, ortho.     Orders Placed This Encounter   . SHINGRIX - ZOSTER VACCINE (ADMIN)   . Tdap Vaccine-Boostrix->=10 yrs(Admin)   . Pneumovax Administration   . A1C with Estimated Average Glucose   . BASIC METABOLIC PANEL   . AST (SGOT)   . ALT (SGPT)   . LIPID PANEL                  Return in about 4 months (around 10/12/2018).      [name], MD 06/12/2018, 16:02

## 2018-06-13 ENCOUNTER — Ambulatory Visit (HOSPITAL_BASED_OUTPATIENT_CLINIC_OR_DEPARTMENT_OTHER): Payer: PRIVATE HEALTH INSURANCE | Admitting: Orthopaedic Surgery

## 2018-06-13 ENCOUNTER — Encounter (HOSPITAL_BASED_OUTPATIENT_CLINIC_OR_DEPARTMENT_OTHER): Payer: Self-pay | Admitting: Orthopaedic Surgery

## 2018-06-13 ENCOUNTER — Encounter (HOSPITAL_BASED_OUTPATIENT_CLINIC_OR_DEPARTMENT_OTHER): Payer: Self-pay | Admitting: Family

## 2018-06-13 ENCOUNTER — Ambulatory Visit (HOSPITAL_BASED_OUTPATIENT_CLINIC_OR_DEPARTMENT_OTHER): Payer: PRIVATE HEALTH INSURANCE | Admitting: Family

## 2018-06-13 ENCOUNTER — Ambulatory Visit (HOSPITAL_BASED_OUTPATIENT_CLINIC_OR_DEPARTMENT_OTHER): Payer: PRIVATE HEALTH INSURANCE

## 2018-06-13 ENCOUNTER — Ambulatory Visit: Payer: PRIVATE HEALTH INSURANCE | Attending: Orthopaedic Surgery | Admitting: Orthopaedic Surgery

## 2018-06-13 VITALS — BP 113/63 | HR 66 | Temp 98.6°F | Ht 73.11 in | Wt 300.7 lb

## 2018-06-13 DIAGNOSIS — I129 Hypertensive chronic kidney disease with stage 1 through stage 4 chronic kidney disease, or unspecified chronic kidney disease: Secondary | ICD-10-CM

## 2018-06-13 DIAGNOSIS — M167 Other unilateral secondary osteoarthritis of hip: Principal | ICD-10-CM

## 2018-06-13 DIAGNOSIS — N183 Chronic kidney disease, stage 3 unspecified (CMS HCC): Secondary | ICD-10-CM | POA: Insufficient documentation

## 2018-06-13 DIAGNOSIS — C73 Malignant neoplasm of thyroid gland: Secondary | ICD-10-CM

## 2018-06-13 DIAGNOSIS — D649 Anemia, unspecified: Secondary | ICD-10-CM | POA: Insufficient documentation

## 2018-06-13 DIAGNOSIS — I10 Essential (primary) hypertension: Secondary | ICD-10-CM | POA: Insufficient documentation

## 2018-06-13 DIAGNOSIS — M069 Rheumatoid arthritis, unspecified: Secondary | ICD-10-CM | POA: Insufficient documentation

## 2018-06-13 DIAGNOSIS — E041 Nontoxic single thyroid nodule: Secondary | ICD-10-CM

## 2018-06-13 DIAGNOSIS — E119 Type 2 diabetes mellitus without complications: Secondary | ICD-10-CM

## 2018-06-13 DIAGNOSIS — D6489 Other specified anemias: Secondary | ICD-10-CM

## 2018-06-13 DIAGNOSIS — Z96641 Presence of right artificial hip joint: Secondary | ICD-10-CM

## 2018-06-13 DIAGNOSIS — M25512 Pain in left shoulder: Secondary | ICD-10-CM | POA: Insufficient documentation

## 2018-06-13 DIAGNOSIS — E1122 Type 2 diabetes mellitus with diabetic chronic kidney disease: Secondary | ICD-10-CM | POA: Insufficient documentation

## 2018-06-13 DIAGNOSIS — E785 Hyperlipidemia, unspecified: Secondary | ICD-10-CM

## 2018-06-13 DIAGNOSIS — E039 Hypothyroidism, unspecified: Secondary | ICD-10-CM | POA: Insufficient documentation

## 2018-06-13 DIAGNOSIS — M06322 Rheumatoid nodule, left elbow: Secondary | ICD-10-CM | POA: Insufficient documentation

## 2018-06-13 DIAGNOSIS — M25511 Pain in right shoulder: Secondary | ICD-10-CM | POA: Insufficient documentation

## 2018-06-13 LAB — BASIC METABOLIC PANEL
ANION GAP: 10 mmol/L (ref 4–13)
BUN/CREA RATIO: 15 (ref 6–22)
BUN: 24 mg/dL (ref 8–25)
CALCIUM: 8.3 mg/dL — ABNORMAL LOW (ref 8.5–10.2)
CHLORIDE: 108 mmol/L (ref 96–111)
CO2 TOTAL: 23 mmol/L (ref 22–32)
CREATININE: 1.65 mg/dL — ABNORMAL HIGH (ref 0.62–1.27)
ESTIMATED GFR: 44 mL/min/{1.73_m2} — ABNORMAL LOW (ref >60)
GLUCOSE: 133 mg/dL (ref 65–139)
POTASSIUM: 4 mmol/L (ref 3.5–5.1)
SODIUM: 141 mmol/L (ref 136–145)

## 2018-06-13 LAB — ALT (SGPT): ALT (SGPT): 20 U/L (ref <55)

## 2018-06-13 LAB — LIPID PANEL
CHOL/HDL RATIO: 2.9
CHOLESTEROL: 108 mg/dL (ref <200)
HDL CHOL: 37 mg/dL — ABNORMAL LOW (ref >=39)
LDL CALC: 50 mg/dL (ref <=100)
NON-HDL: 71 mg/dL (ref <=190)
TRIGLYCERIDES: 103 mg/dL (ref <=150)
VLDL CALC: 21 mg/dL (ref <30)

## 2018-06-13 LAB — AST (SGOT): AST (SGOT): 13 U/L (ref 8–48)

## 2018-06-13 NOTE — Patient Instructions (Signed)
[facility] for Joint Replacement    In order to extend the life of your joint replacement, we recommend the following:    1) Low impact activity mainly.  Examples include walking, cycling, swimming, hiking and elliptical machine. Avoid running and jumping other than occasional short bursts.  It's okay to snow ski but I would avoid moguls and jumps.      2) Maintain a healthy weight.  The lighter you are, the easier it will be on the prosthesis.    3) There is a lifetime risk of infection around the prosthesis.  1% of prostheses per year become infected.  Infections are best avoided by staying healthy.  You are most prone to infection when the immune system is weakened as with exhaustion and stress.  Distant infections should be treated promptly.  These include abscessed teeth, bladder infections, boils and even pneumonia.      4) Infections have been known to follow dental procedures so we advise that you receive antibiotics before dental cleanings and dirty procedures.  If you are not allergic to Penicillin, we recommend Amoxicillin 2000 mg by mouth 60 minutes before dental cleanings and procedures.  If allergic to Penicillin, we advise Clindamycin 600 mg by mouth 60 minutes before dental cleanings and procedures.    5) Increasing pain, redness, warmth, swelling or any drainage is a sign of infection and should be evaluated by us immediately.  Absolutely no one is to start you on an antibiotic without first being evaluated by me or one of my colleagues.    Your implant is likely to set off a metal detector and you do not need an implant card to get through the airport.  Most screening today is done by body scanner (xray) so the airport security will actually see the implant.  If you do pass through a magnetometer (metal detector), the agent will still have to pat you down regardless of whether you carry an implant card.    Follow up schedule after joint replacement is critical to monitor the performance of  your joint replacement.  We would like to see you at your one year anniversary and then every other year thereafter.    Most importantly, be as active as you can be and maintain a healthy lifestyle.    Adam E. Klein MD  [place] Department of Orthopaedic Surgery  [phone]

## 2018-06-13 NOTE — Progress Notes (Signed)
[place] for Joint Replacement    Patient name:  [name]  MRN:  [id number]  Date of Birth:  [birth year]  Encounter Date:  06/13/2018    Subjective  The patient follows up 1 years status post right total Hip replacement.  He offers no complaints relative to the prosthetic hip.  There is nothing that he cannot do at this point. The patient reports 0/10 pain.   Had a left hip corticosteroid injection yesterday by interventional radiology and this feels much better.  [name] is the reason for the recent pain exacerbation.    Exam:  On exam, he is well appearing and in no apparent distress.  BP 113/63   Pulse 66   Temp 37 C (98.6 F) (Thermal Scan)   Ht 1.857 m (6' 1.11")   Wt (!) 136.4 kg (300 lb 11.3 oz)   BMI 39.55 kg/m       Range of motion is as follows:  Flexion:  90  Internal rotation:15  External rotation:  10  Abduction:  15  Adduction:  5  Gait is normal.  The surgical site is without signs of infection such as erythema, edema, or warmth.  Leg lengths are equal.  There is no obvious sign of DVT.    X-rays:  Today's x-rays were reviewed and show a well-fixed, well aligned right total hip replacement without signs of loosening, lysis or excessive polyethylene wear.    Impression:  1 years status post right total Hip replacement and djd left hip  Morbid obesity   Rheumatoid arthritis   Rheum nodules      Plan:  The patient was encouraged to remain active and engage in low impact activity.  He was reminded of the signs and symptoms of prosthetic joint infection and things that place the patient at risk such as an abscessed tooth, bladder infection or even pneumonia.  The patient was encouraged to get those infections treated promptly and return to [place] immediately if he develops new onset pain, swelling, or warmth.  Otherwise the patient will return in 2 years for routine follow up with an updated bilateral total hip series.  The patient was encouraged to call with questions or concerns.  May want  to have the intra-articular corticosteroid injection repeated in 3 to 4 months but will wait to hear from him.    Jacqualine Code, MD  06/13/2018, 10:18      Cc:    [name], [place] [address]    Przyszlak, [place], MD  [facility]  [address]  [place], [place] [address]

## 2018-06-13 NOTE — Progress Notes (Signed)
IDENTIFICATION:     63 y.o. male   referred by Domingo Sep, PA-C  for "chronic kidney disease"    Chief Complaint:  States I am here today because "I used to follow with Dr. Lynne Leader for my kidney disease, but he retired and I moved to [place]."    SUBJECTIVE DATA:  History of Present Illness  Started following with Dr. Lynne Leader for CKD in 2016.  States that his kidney function has been stable and he brings a log to clinic with him indicating creatinine ranging 1.6-1.6, since 04/2014.  He does have diabetes and HTN.  Also took a good bit of NSAIDs in the past for RA.  He knows to avoid them now.    His diabetes has been pretty well controlled most recently and FS readings 120-150.  Following with Cleatrice Burke in endocrine.    REVIEW OF SYSTEMS:   Denies chest pain or dyspnea. No lower extremity edema.   Appetite "ok."  Denies N/V.  No fever or chills.  No dysuria or frank hematuria.  All other pertinent systems negative.     Past Medical History:     Patient Active Problem List   Diagnosis   . Primary osteoarthritis of right hip   . Type 2 diabetes mellitus with renal complication (CMS HCC)   . Rheumatoid arthritis involving right hip (CMS HCC)   . Osteoarthritis of right hip   . Pain of left hip joint   . Primary osteoarthritis of left hip   . CKD (chronic kidney disease) stage 3, GFR 30-59 ml/min (CMS HCC)   . Essential hypertension   . Hypothyroidism   . Rheumatoid arthritis (CMS [place])   . Thyroid nodule     Past Surgical History:   Procedure Laterality Date   . ARTHROPLASTY HIP TOTAL Right 06/15/2017    Performed by Jacqualine Code, MD at [facility]     . HX HEART CATHETERIZATION      1 cardiac stent   . HX WRIST FRACTURE TX Right     plate and removal   . IR ARTHROGRAM - HIP LEFT Left 06/12/2018    Performed by Claris Gower, MD at [facility]   . IR ARTHROGRAM - HIP LEFT Left 03/16/2018    Performed by Claris Gower, MD at [facility]   . KNEE SURGERY Bilateral      bil knees   . SHOULDER SURGERY Bilateral     scopes with rotator cuff both    . TOTAL THYROIDECTOMY       Family Medical History:     Problem Relation (Age of Onset)    No Known Problems Mother, Father        Social History     Tobacco Use   . Smoking status: Never Smoker   . Smokeless tobacco: Never Used   Substance Use Topics   . Alcohol use: Yes     Comment: 4 times a year   . Drug use: Never     Current Outpatient Medications   Medication Sig   . aspirin (ECOTRIN) 81 mg Oral Tablet, Delayed Release (E.C.) Take 81 mg by mouth Once a day   . atorvastatin (LIPITOR) 40 mg Oral Tablet Take 40 mg by mouth Every evening   . calcium carbonate/vitamin D3 (VITAMIN D-3 ORAL) Take by mouth 1000 IU daily   . FOLIC ACID ORAL Take 1 mg by mouth Once a day    .  hydroxychloroquine (PLAQUENIL) 200 mg Oral Tablet Take 200 mg by mouth Once a day    . levothyroxine (SYNTHROID) 100 mcg Oral Tablet Take 100 mcg by mouth Every morning Takes a total of 237 mcg daily   . levothyroxine (SYNTHROID) 137 mcg Oral Tablet Take 237 mcg by mouth Every morning   . lidocaine (PF) 1% (10 mg/mL) Solution with methotrexate (PF) 25 mg/mL Solution 50 mg/m2 25 mg/m2 by Intramuscular route Takes on saturdays   . losartan (COZAAR) 50 mg Oral Tablet Take 50 mg by mouth Once a day In am   . MetFORMIN (GLUCOPHAGE) 1,000 mg Oral Tablet Take 50 mg by mouth Twice daily with food    . methotrexate, PF, 15 mg/0.3 mL Subcutaneous Auto-Injector 15 mg by Subcutaneous route Every 7 days   . metoprolol tartrate (LOPRESSOR) 25 mg Oral Tablet Take 25 mg by mouth Twice daily   . sennosides-docusate sodium (SENOKOT-S) 8.6-50 mg Oral Tablet Take 1 Tab by mouth Twice daily (Patient not taking: Reported on 06/30/2017)     No Known Allergies     OBJECTIVE DATA:  [name]  is a  63 y.o.  Caucasian male  who presents to clinic alone today.  He is ambulatory without difficulty and without the use of assistive devices.  He is in no acute distress and is pleasant and  cooperative.      Vitals:    06/13/18 1113   BP: 124/70   Pulse: 66   Temp: 37 C (98.6 F)   TempSrc: Thermal Scan   SpO2: 96%   Weight: (!) 136.5 kg (300 lb 14.9 oz)   Height: 1.829 m (6')   BMI: 40.9        Examination  General: Appears stated age  Eyes: anicteric  HENT: Head normocephalic and atraumatic.  Mucous membranes moist.   Neck: subtle, midline trachea  Lungs:  Clear to auscultation. No rales, crackles, or wheezes.  Displays good respiratory effort.  Cardiovascular: normal S1, S2.  RRR.  Abdomen: Soft, non-distended.  Extremities: No lower extremity edema  Skin: Warm, dry, intact  Neurologic:No tremors  Psychiatric: Normal mood and affect.             Laboratory data:  Lab Results   Component Value Date    BUN 38 05/25/2018    BUN 23 03/15/2018    BUN 24 12/01/2017    CREATININE 1.77 05/25/2018    CREATININE 1.52 03/15/2018    CREATININE 1.34 12/01/2017    BUNCRRATIO 21 05/25/2018    BUNCRRATIO 15 03/15/2018    BUNCRRATIO 18 12/01/2017    GFR 40 05/25/2018    GFR 48 03/15/2018    GFR 56 12/01/2017    SODIUM 141 05/25/2018    SODIUM 148 03/15/2018    SODIUM 143 12/01/2017    POTASSIUM 5.0 05/25/2018    POTASSIUM 4.8 03/15/2018    POTASSIUM 6.1 12/01/2017    CHLORIDE 106 05/25/2018    CHLORIDE 110 03/15/2018    CHLORIDE 109 12/01/2017    CO2 21 05/25/2018    CO2 23 03/15/2018    CO2 21 12/01/2017    ANIONGAP 10 06/16/2017    ANIONGAP 7 05/24/2017    CALCIUM 8.9 05/25/2018    CALCIUM 8.5 06/16/2017    CALCIUM 9.4 05/24/2017    PHOSPHORUS 3.1 05/25/2018    ALBUMIN 4.3 05/25/2018    HGB 13.3 05/25/2018    HGB 11.7 (L) 06/16/2017    HGB 12.6 06/15/2017    HGB 14.1 05/24/2017  HCT 39.6 05/25/2018    HCT 34.6 (L) 06/16/2017    HCT 37.2 06/15/2017    HCT 42.2 05/24/2017     Diagnosis and Plan  1. CKD stage 3 with risk factors of HTN, diabetes, h/o NSAID use with arthritis, and IV contrast dye exposures.    Creatinines ranging 1.4-1.6 since at least 2016.   Most recent creatinine level slightly above baseline at  1.77.  This may be secondary to some hypovolemia with fasting labs.  Will recheck BUN and creatinine today.  Urine testing without overt proteinuria.   I reminded him on the importance of avoiding nephrotoxic agents such as NSAIDs and IV contrast dye exposure.  I also discussed with him the importance of adequate hydration.  Understanding was verbalized.    2. Anemia.  No clinical evidence.  H/H well within target.  3. Bone mineral disease.  Calcium and phorphorus good.  Will check vitamin d and iPTH with next set of labs.   4. HTN.  BP well controlled.  Continue current meds.  5.  Diabetes.  A1c's have been below 7. Continue to follow with endocrine.  No overt proteinuria.  He is on an ARB and should continue for renal protection.    I will see him back in follow-up in 3 months for re-evaluation of above mentioned conditions.     Patient was seen independently.      Rene Kocher. Eulas Post, [facility]   Nephrology Nurse Practitioner   [place] [facility]

## 2018-06-14 ENCOUNTER — Other Ambulatory Visit: Payer: Self-pay

## 2018-06-14 ENCOUNTER — Ambulatory Visit (HOSPITAL_BASED_OUTPATIENT_CLINIC_OR_DEPARTMENT_OTHER): Payer: Self-pay | Admitting: INTERNAL MEDICINE-ENDOCRINOLOGY-DIABETES AND METABOLISM

## 2018-06-14 ENCOUNTER — Ambulatory Visit: Payer: PRIVATE HEALTH INSURANCE | Attending: NURSE PRACTITIONER | Admitting: NURSE PRACTITIONER

## 2018-06-14 ENCOUNTER — Encounter (HOSPITAL_BASED_OUTPATIENT_CLINIC_OR_DEPARTMENT_OTHER): Payer: Self-pay | Admitting: NURSE PRACTITIONER

## 2018-06-14 ENCOUNTER — Ambulatory Visit (HOSPITAL_BASED_OUTPATIENT_CLINIC_OR_DEPARTMENT_OTHER): Payer: PRIVATE HEALTH INSURANCE

## 2018-06-14 ENCOUNTER — Ambulatory Visit (HOSPITAL_BASED_OUTPATIENT_CLINIC_OR_DEPARTMENT_OTHER): Payer: Self-pay | Admitting: Family

## 2018-06-14 ENCOUNTER — Encounter (HOSPITAL_BASED_OUTPATIENT_CLINIC_OR_DEPARTMENT_OTHER): Payer: Self-pay | Admitting: Family Medicine

## 2018-06-14 VITALS — BP 130/78 | HR 68 | Ht 72.6 in | Wt 307.8 lb

## 2018-06-14 DIAGNOSIS — Z85850 Personal history of malignant neoplasm of thyroid: Secondary | ICD-10-CM | POA: Insufficient documentation

## 2018-06-14 DIAGNOSIS — E079 Disorder of thyroid, unspecified: Secondary | ICD-10-CM

## 2018-06-14 DIAGNOSIS — Z79890 Hormone replacement therapy (postmenopausal): Secondary | ICD-10-CM | POA: Insufficient documentation

## 2018-06-14 DIAGNOSIS — E119 Type 2 diabetes mellitus without complications: Secondary | ICD-10-CM | POA: Insufficient documentation

## 2018-06-14 DIAGNOSIS — Z7984 Long term (current) use of oral hypoglycemic drugs: Secondary | ICD-10-CM | POA: Insufficient documentation

## 2018-06-14 DIAGNOSIS — C73 Malignant neoplasm of thyroid gland: Secondary | ICD-10-CM

## 2018-06-14 DIAGNOSIS — I252 Old myocardial infarction: Secondary | ICD-10-CM | POA: Insufficient documentation

## 2018-06-14 DIAGNOSIS — E785 Hyperlipidemia, unspecified: Secondary | ICD-10-CM | POA: Insufficient documentation

## 2018-06-14 DIAGNOSIS — E890 Postprocedural hypothyroidism: Secondary | ICD-10-CM | POA: Insufficient documentation

## 2018-06-14 DIAGNOSIS — I10 Essential (primary) hypertension: Secondary | ICD-10-CM | POA: Insufficient documentation

## 2018-06-14 LAB — HGA1C (HEMOGLOBIN A1C WITH EST AVG GLUCOSE)
ESTIMATED AVERAGE GLUCOSE: 128 mg/dL
HEMOGLOBIN A1C: 6.1 % — ABNORMAL HIGH (ref 4.0–5.6)

## 2018-06-14 LAB — THYROID STIMULATING HORMONE (SENSITIVE TSH): TSH: 1.722 u[IU]/mL (ref 0.350–5.000)

## 2018-06-14 NOTE — Progress Notes (Signed)
ENDOCRINE HISTORY AND PHYSICAL NEW PATIENT    Cc: PTC and Diabetes, type 2       HPI:  Mr. [name] is a 62 y.o. man referred to endocrine clinic for further evaluation and management of thyroid cancer and  Diabetes, type 2. He has been DM for past 10 years. He has established with [facility] for PCP on Monday.     He had thyroidectomy total in July  of 2018. He was seen by endocrinology of PTC.  He states was evaluated for RAI and told not to do it. He has pathology report [id number]. He doesn't believe he has had an neck u/s since since surgery. His last neck u/s was 11/28/17.    He was order for 237 mcg daily. He is taking 100 mg and 137 mcg. He change his dose himself due to low TSH.  He is taking 190 mcg for past 3 months. He is taking 137 mcg and 50 mcg pil= 187 mcg.      He denies issues with low TSH-- no palpations, racing HR, or shaking hands. He has no loose stools of an issues.  He denies weight gain or loss.  He dneis issues in neck size or shape. He has no swallowing or choking.   He has sore neck with arthritis     He has a stent placement a couple years ago. He had SOB and had stress test due to family hx. He had heart cath and came out with stent. He has cluster of nodules under chest. It has not been seen like this before. He feels doesn't grow will not do anything.  He will have CT scan Q6 months for a year and now is another year.       He has BS logs which have BS of 118-144 Fasting  He is only on metformin.   He is on Lipitor and tolerating well with no muscle aches or pains.  He has no hypoglycemia  He has gets eye exam but states he is over due.  He has no feet issues, no sores or ulcerations.     Past Medical History:  Past Medical History:   Diagnosis Date   . Arthritis    . Back problem    . Bruises easily    . Cancer (CMS [place])    . Diabetes (CMS [place])    . Heartburn    . HTN (hypertension)    . Hyperlipidemia    . Hypothyroid    . Kidney disease    . MI (myocardial infarction) (CMS [place])    .  Neck problem    . Rheumatoid arthritis (CMS [place])    . Thyroid cancer (CMS [place])    . Type 2 diabetes mellitus (CMS HCC)    . Wears glasses      Social History:  No smoke  No ETOH    Family History:  No thyroid cancer   Some DM in father side of family     Meds:  Current Outpatient Medications   Medication Sig   . aspirin (ECOTRIN) 81 mg Oral Tablet, Delayed Release (E.C.) Take 81 mg by mouth Once a day   . atorvastatin (LIPITOR) 40 mg Oral Tablet Take 40 mg by mouth Every evening   . calcium carbonate/vitamin D3 (VITAMIN D-3 ORAL) Take by mouth 1000 IU daily   . FOLIC ACID ORAL Take 1 mg by mouth Once a day    . hydroxychloroquine (PLAQUENIL) 200 mg Oral Tablet  Take 200 mg by mouth Once a day    . levothyroxine (SYNTHROID) 100 mcg Oral Tablet Take 100 mcg by mouth Every morning Takes a total of 237 mcg daily   . levothyroxine (SYNTHROID) 137 mcg Oral Tablet Take 237 mcg by mouth Every morning   . lidocaine (PF) 1% (10 mg/mL) Solution with methotrexate (PF) 25 mg/mL Solution 50 mg/m2 25 mg/m2 by Intramuscular route Takes on saturdays   . losartan (COZAAR) 50 mg Oral Tablet Take 50 mg by mouth Once a day In am   . MetFORMIN (GLUCOPHAGE) 1,000 mg Oral Tablet Take 50 mg by mouth Twice daily with food    . methotrexate, PF, 15 mg/0.3 mL Subcutaneous Auto-Injector 15 mg by Subcutaneous route Every 7 days   . metoprolol tartrate (LOPRESSOR) 25 mg Oral Tablet Take 25 mg by mouth Twice daily     No Known Allergies        ROS:  He has no polyuria or polydipsia and all other systems were reviewed and are negative except as mentioned above.          Physical Exam:  Vitals:    06/14/18 0946   BP: 130/78   Pulse: 68   SpO2: 100%   Weight: (!) 139.6 kg (307 lb 12.2 oz)   Height: 1.844 m (6' 0.6")   BMI: 41.14     General: pleasant, well-appearing, no acute distress, conversant   Eyes: Conjunctiva clear, sclera non-icteric, no stare or periorbital edema  ENTM: atraumatic, oral mucosa moist and pink, nl pinna  Neck: supple, trachea  midline, neck supple   Thyroid: no visible goiter.  Respiratory: No audible wheeze or rhonchi, No increased respiratory effort, symmetrical chest.  Cardiovascular: No BLE edema, no cyanosis to mouth or lips.  Musculoskeletal:  Normal gait, Normal joints to phalanges; nails normal  Skin: warm and dry, no visible rashes or abnormal bruising.  Neurologic: alert and oriented x 3, no hand tremor, normal speech.   Psych: appropriate affect, no anxiety, A&Ox3, appropriate judgement.   Diabetic foot exam:  Both feet without edema or ulcerations. Pulses normal bilaterally. Sensation normal bilaterally      Labs/Studies/Imaging:  OUTSIDE LAB  03/15/18  TSH 0.33  Thyroglobulin Tg <2.0 ng/ml  Thyroglobulin antibody <1.0 IU/ml     08/03/17   TSH <0.006  Free T4 2.89  Thyroglobulin antibody <1.0  Thyroglobulin by IMA 0.1 ng/ml     Results for Cesar, [name] (MRN [id number]) as of 06/14/2018 11:33   Ref. Range 06/13/2018 12:01   SODIUM Latest Ref Range: 136 - 145 mmol/L 141   POTASSIUM Latest Ref Range: 3.5 - 5.1 mmol/L 4.0   CHLORIDE Latest Ref Range: 96 - 111 mmol/L 108   CARBON DIOXIDE Latest Ref Range: 22 - 32 mmol/L 23   BUN Latest Ref Range: 8 - 25 mg/dL 24   CREATININE Latest Ref Range: 0.62 - 1.27 mg/dL 1.65 (H)   GLUCOSE Latest Ref Range: 65 - 139 mg/dL 133   ANION GAP Latest Ref Range: 4 - 13 mmol/L 10   BUN/CREAT RATIO Latest Ref Range: 6 - 22  15   ESTIMATED GLOMERULAR FILTRATION RATE Latest Ref Range: >60 mL/min/1.23m2 44 (L)   CALCIUM Latest Ref Range: 8.5 - 10.2 mg/dL 8.3 (L)   CHOLESTEROL Latest Ref Range: <200 mg/dL 108   HDL-CHOLESTEROL Latest Ref Range: >=39 mg/dL 37 (L)   LDL (CALCULATED) Latest Ref Range: <=100 mg/dL 50   TRIGLYCERIDES Latest Ref Range: <=150 mg/dL 103  VLDL (CALCULATED) Latest Ref Range: <30 mg/dL 21   CHOL/HDL RATIO Unknown 2.9   NON - HDL (CALCULATED) Latest Ref Range: <=190 mg/dL 71   AST (SGOT) Latest Ref Range: 8 - 48 U/L 13   ALT (SGPT) Latest Ref Range: <55 U/L 20   HEMOGLOBIN A1C Latest  Ref Range: 4.0 - 5.6 % 6.1 (H)   ESTIMATED AVERAGE GLUCOSE Latest Units: mg/dL 128             Imaging:           Review of outside records:Patient brought in all labs and scans and BS there were no MD notes except a surgical reports from [facility].       Assessment and Plan:  Mr [name] is a 63 y.o. man with Diabetes, type 2 on metformin only with hx of MI/stents. He also here due to [facility], total thyroidectomy 10/2016 with no RAi.   PTC, s/p total thyroidectomy ; post surgical hypothyroidism; diabetes, type 2 fairly well controlled; dyslipidemia; HTN, hx of MI/Stents; possible DM Retinopathy or neuropathy.    Counseled on thyroid cancer regarding follow up and plan of treatment.  Thyroid cancer is treated by endocrinologist not hematologist/oncologist. With appropriate treatment and regular follow up there is excellent 5 year cure.  There is 5 year suppressive therapy of TSH. The goal TSH is to be close to 0.1-0.3 but for low risk <2.0 and be aware that other medical professional will be concerned that TSH is too low. Please make them aware that it is purposeful for thyroid cancer treatment. Will monitor TSH Q 3 months or PRN with dosage changes.  Will monitor thyroglobulin tumor markers and antibodies nonstimulated due to no RAI treatment. Goal is zero proteins found but very low is acceptable due to no RAI. Lastly, ultrasound will be monitored Q6 months or PRN with concerns to assess thyroid bed and lymph nodes of the neck.      Today, TSH and set up neck u/s (last in 11/2017) and also need for non stimulated Tg today.     Will advise on LT4 dose after labs   No change in DM meds or cholesterol meds.  A1C today.   Aware needs eye exam and daily monitoring of feet.    Depending on A1C will discuss meds GLP or SGLT2 with cardiovascular risk benefits due to hx of MI.    Follow up 2-3 motnhs with neck u/s   Orders Placed This Encounter   . US SOFT TISSUE NECK   . Tsh   . THYROID STIMULATING HORMONE (SENSITIVE TSH)   .  THYROGLOBULIN, TUMOR MARKER REFLEX TO LC-MS/MS OR IMMUNOASSAY   . Refer to [facility] Podiatry-[facility]       I have discussed the above with the patient and answered all of her questions to her satisfaction.  She is in agreement with the above plan.    I have indicated to the patient that she will be contacted with any lab/study results but, in the case that she doesn't hear from this department after about a week of doing any labs or studies, she should contact me.     Thank you for the opportunity to work with this pleasant patient.  Please feel free to contact me with any questions or concerns via my chart or phone.    The patient was seen independently.    Melissa K. Hardin Negus, APRN  Family Nurse Practitioner, C  Section of Endocrinology              "  I have not seen this patient nor reviewed the chart nor the note.  I am signing this note as a [place] requirement only and not because I have had anything to do with this patient's care.  Bridgett Larsson, MD 06/20/2018 "

## 2018-06-14 NOTE — H&P (Signed)
PATIENT NAME: [name] NUMBER:  [id number]  DATE OF SERVICE: 06/13/2018  DATE OF BIRTH:  [birth year]    HISTORY AND PHYSICAL    CHIEF COMPLAINT:  Left elbow rheumatoid nodules as well as bilateral shoulder soreness.    HISTORY OF PRESENT ILLNESS:  Ayodele Sangalang is a 63 year old male with a history of rheumatoid arthritis, diabetes, and thyroid issues, who presents today for evaluation of his left elbow, primarily, as well as secondarily some pain in his shoulders.  He says that he has had rheumatoid arthritis for the past 6-7 years; this is followed by Dr. Doyne Keel at [facility].  He says for the last year or so, he has noticed these nodules on his left elbow, specifically the left olecranon, and then over his proximal ulna 2 main clusters of nodules seem to bother him mostly when he lays his elbow down.  He wants to know if these can be removed.  In terms of his RA, the patient takes hydroxychloroquine as well as methotrexate.  He has not done anything for these nodules.  No injections and no previous treatments of them.  He still has full range of motion.  Otherwise painless unless he is bending them.  In terms of the shoulders, the patient reports he has had bilateral rotator cuff repairs by a physician in [place].  He says that shoulders have been doing fine in terms of range of motion, but he occasionally feels some soreness in the anterior aspect of both shoulders.  He notices when he is brushing his teeth that his shoulder is becoming more sore.  He wants to know if he can do any sort of exercises to help with this pain.  He is not looking for surgery on his shoulders at this point.    PAST MEDICAL HISTORY:  Positive for hypertension, rheumatoid arthritis, diabetes, thyroid disease, cardiovascular disease, kidney disease, and previous thyroid cancer.    PAST SURGICAL HISTORY:  Bilateral rotator cuff repairs, right hip replacement, bilateral knee arthroscopies  with debridements, heart catheterization with stents, and a thyroidectomy.    CURRENT MEDICATIONS:  1. Atorvastatin 40 mg.  2. Aspirin 81 mg.  3. Folic acid.  4. Hydroxychloroquine 200 mg.  5. Levothyroxine 237 mcg.  6. Losartan.  7. Metformin.  8. Methotrexate weekly.  9. Metoprolol.  10. Vitamin D.    ALLERGIES:  No known drug allergies.    SOCIAL HISTORY:  The patient is a retired Ecologist.  He previously worked in [place], but now lives full-time in [place].  The patient commutes to and from [place] about once every 3 months for physician appointments.  He spends 1 week in [place] getting all of his appointments done at the same time.  He does not smoke and does not drink.  He is married.  He enjoys golfing and fishing.    FAMILY HISTORY:  Positive for hypertension.  No issues with anesthesia.    REVIEW OF SYSTEMS:  Positive for decreased hearing, history of MRSA, shortness of breath and numbness and tingling in his feet and stiffness.    PHYSICAL EXAMINATION:  Vitals:  Blood pressure is 126/58, pulse 77, temperature 36.3 degrees Celsius, weight 136.6, height 184.6.    General:  The patient is sitting in the exam room in no acute distress.    Psych:  The patient's mood and affect are congruent with the clinical situation.    Respiratory:  The patient's breathing is nonlabored.  No respiratory distress.    Cardiovascular:  The patient has 2+ peripheral pulses.  Regular rate.    Abdomen is flat and nondistended.  No organomegaly.    HEENT:  Eyes are not icteric.  No blue sclerae.  Head is normocephalic, atraumatic.    Neurologic:  The patient has sensation intact to light touch in median, ulnar, axial, and radial nerve distributions of bilateral upper extremities.    Integument:  The patient has 2 masses over his left elbow, 1 directly over the olecranon and 1 over his proximal ulna.  The one over his olecranon is a little bit larger, approximately the size of a golf ball.    Musculoskeletal:  The  patient has full range of motion of the elbows bilaterally.  Full range of motion of the shoulders bilaterally.  No pain with range of motion of the elbows.  He can perform a thumbs-up, okay sign, finger cross, and finger spread.  He has 5/5 strength throughout the rotator cuff.  The patient does have some pain anteriorly.  Some pain with Speed test and with O'Brien's test on the left.    IMAGING:  No new imaging was taken today.    ASSESSMENT:  Marcin Holte is a 63 year old male with left elbow rheumatoid nodules, bilateral shoulder pain.    PLAN:  In terms of the patient's left elbow, we discussed with him that we can potentially take these out in the procedure room.  He is under a unique time constraint because he is only back here every few months.  He is planning on being back here on December 12, 2018, through December 16, 2018, and would like to have these removed in the procedure room at that time.  We said that we can potentially do this; however, it can be difficult scheduling 6 months out as his condition may change.  What we will do is schedule him for the procedure and see how he is doing that day with the understanding that if his condition has changed drastically, we might not be able to perform the procedure.  In terms of the shoulders, he can do gentle workouts of both shoulders.  We recommend therapy for him; however, he said that due to his insurance coverage, he does not think he will be able to do therapy down in [place], so we recommended some gentle range of motion exercises for the shoulders.  We also want him to talk to his rheumatologist to see what medications he needs to stop prior to the procedure.  He was in agreement with our plan.  We will see him back in approximately 6 months in September.        Reyne Dumas, MD      Edwina Grossberg Jamse Mead, MD  Assistant Professor   Department of [place] of Medicine            CC:   [name], MD   [place], [place] [address]     Jacqualine Code, [address]   [place], [place] [address]       DD:  06/13/2018 09:19:56  DT:  06/14/2018 09:23:18 TM  D#:  [id number]    Attending Physician Statement: I personally evaluated Orpah Cobb, participated in / performed the significant components of this encounter, and agree with the findings as documented above. I made corrections where necessary.    IMPRESSION and PLAN: I agree with  the impression and plan as documented above.     Mr/s Andal expressed understanding of the diagnosis and agreement with treatment as described above.  I asked the patient for any questions and concerns and addressed them accordingly.    Lyla Son, MD  Assistant Professor  Hand, Elbow, and Shoulder Surgery  Orthopedic Sports Medicine

## 2018-06-15 LAB — THYROGLOBULIN, TUMOR MARKER REFLEX TO LC-MS/MS OR IMMUNOASSAY: THYROGLOBULIN ANTIBODY, S: <1.8 IU/mL (ref <4.0)

## 2018-06-15 LAB — THYROGLOBULIN, TUMOR MARKER, IA, S REFLEX ONLY: THYROGLOBULIN, TUMOR MARKER, IA, S: 0.4 ng/mL — ABNORMAL HIGH

## 2018-06-16 ENCOUNTER — Other Ambulatory Visit (HOSPITAL_BASED_OUTPATIENT_CLINIC_OR_DEPARTMENT_OTHER): Payer: Self-pay | Admitting: Family Medicine

## 2018-06-16 DIAGNOSIS — E039 Hypothyroidism, unspecified: Secondary | ICD-10-CM

## 2018-06-17 ENCOUNTER — Encounter (HOSPITAL_BASED_OUTPATIENT_CLINIC_OR_DEPARTMENT_OTHER): Payer: Self-pay | Admitting: Family Medicine

## 2018-06-18 NOTE — Progress Notes (Signed)
Hi  Your TG is not zero but still pretty low. I found prior Tg assays of 0.1 so this is a a bit higher. I will continue to monitor and do u/s neck in June as discussed  Carleena Mires K. Hardin Negus, APRN  Family Nurse Practitioner-C  Section of Endocrinology

## 2018-06-19 NOTE — Telephone Encounter (Signed)
-----   Message from Orpah Cobb sent at 06/17/2018 12:04 PM EDT -----  Regarding: Prescription Question  Contact: [phone]  My Losartan needs to be refilled  if you can. 50 MG one tablet by mouth once daily.  The pharmacy is [facility] [place] [facility] [place].  Thanks

## 2018-06-20 ENCOUNTER — Encounter (HOSPITAL_BASED_OUTPATIENT_CLINIC_OR_DEPARTMENT_OTHER): Payer: Self-pay | Admitting: Orthopaedic Surgery

## 2018-06-20 MED ORDER — LOSARTAN 50 MG TABLET
50.0000 mg | ORAL_TABLET | Freq: Every day | ORAL | 3 refills | Status: DC
Start: 2018-06-20 — End: 2019-04-26

## 2018-06-20 NOTE — Telephone Encounter (Signed)
Received refill request to be sent to [place], [place]. Refilled Losartan and sent to proper pharmacy.    Dennison Mascot, MD  [facility]  Department of [facility] Medicine  PGY-2

## 2018-06-26 ENCOUNTER — Ambulatory Visit (HOSPITAL_BASED_OUTPATIENT_CLINIC_OR_DEPARTMENT_OTHER): Payer: Self-pay | Admitting: Family

## 2018-07-27 ENCOUNTER — Ambulatory Visit (HOSPITAL_BASED_OUTPATIENT_CLINIC_OR_DEPARTMENT_OTHER): Payer: Self-pay | Admitting: Family

## 2018-08-29 ENCOUNTER — Encounter (HOSPITAL_BASED_OUTPATIENT_CLINIC_OR_DEPARTMENT_OTHER): Payer: Self-pay | Admitting: Family Medicine

## 2018-08-30 ENCOUNTER — Encounter (HOSPITAL_BASED_OUTPATIENT_CLINIC_OR_DEPARTMENT_OTHER): Payer: Self-pay | Admitting: Family Medicine

## 2018-08-31 LAB — URINALYSIS, MACROSCOPIC AND MICROSCOPIC
BACTERIA: NONE SEEN
BILIRUBIN: NEGATIVE
BILIRUBIN: NEGATIVE
GLUCOSE: NEGATIVE
GLUCOSE: NEGATIVE
KETONES: NEGATIVE
KETONES: NEGATIVE
NITRITE: NEGATIVE
NITRITE: NEGATIVE
PH URINE: 5.5
PH URINE: 5.5
PH: 6 (ref <8.0)
PROTEIN: NEGATIVE
PROTEIN: NEGATIVE
RBC'S: NONE SEEN
SPECIFIC GRAVITY, URINE: 1.024
SPECIFIC GRAVITY, URINE: 1.024
UROBILINOGEN: 0.2
UROBILINOGEN: 0.2

## 2018-08-31 LAB — ENTER/EDIT NEPHROLOGY EXTERNAL LABS
25-HYDROXY VITAMIN D: 46.5
25-HYDROXY VITAMIN D: 46.5
ALBUMIN (SERUM): 4.2
ALBUMIN (SERUM): 4.2
BUN: 21
BUN: 21
BUN: 21
CALCIUM: 9.3
CALCIUM: 9.4
CARBON DIOXIDE: 23
CARBON DIOXIDE: 23
CHLORIDE: 106
CHLORIDE: 106
CREATININE, UR RAND: 171.5
CREATININE, UR RAND: 171.5
CREATININE: 1.74 — ABNORMAL HIGH
CREATININE: 1.74 — ABNORMAL HIGH
HCT: 40.4
HCT: 40.4
HGB: 13.8
HGB: 13.8
INTACT PTH: 44
INTACT PTH: 44
PHOSPHORUS: 3.2
PHOSPHORUS: 3.2
PHOSPHORUS: 3.2
POTASSIUM: 4.6
POTASSIUM: 4.6
PROTEIN, URINE, RANDOM: 16.6
SODIUM: 145 — ABNORMAL HIGH
SODIUM: 145 — ABNORMAL HIGH
SODIUM: 145 — ABNORMAL HIGH

## 2018-09-01 ENCOUNTER — Encounter (HOSPITAL_BASED_OUTPATIENT_CLINIC_OR_DEPARTMENT_OTHER): Payer: Self-pay | Admitting: Family Medicine

## 2018-09-02 ENCOUNTER — Other Ambulatory Visit (HOSPITAL_BASED_OUTPATIENT_CLINIC_OR_DEPARTMENT_OTHER): Payer: Self-pay | Admitting: Family Medicine

## 2018-09-02 DIAGNOSIS — M069 Rheumatoid arthritis, unspecified: Secondary | ICD-10-CM

## 2018-09-04 ENCOUNTER — Encounter (HOSPITAL_BASED_OUTPATIENT_CLINIC_OR_DEPARTMENT_OTHER): Payer: Self-pay | Admitting: Family

## 2018-09-05 ENCOUNTER — Encounter (HOSPITAL_BASED_OUTPATIENT_CLINIC_OR_DEPARTMENT_OTHER): Payer: Self-pay

## 2018-09-05 ENCOUNTER — Encounter (HOSPITAL_BASED_OUTPATIENT_CLINIC_OR_DEPARTMENT_OTHER): Payer: Self-pay | Admitting: Family

## 2018-09-06 ENCOUNTER — Ambulatory Visit (HOSPITAL_BASED_OUTPATIENT_CLINIC_OR_DEPARTMENT_OTHER): Payer: Self-pay | Admitting: Rheumatology

## 2018-09-06 LAB — ENTER/EDIT EXTERNAL COMMON LAB RESULTS: TSH: <0.015 — ABNORMAL LOW

## 2018-09-06 NOTE — Telephone Encounter (Signed)
Left detailed message for patient to return my call. Soonest avilable I have with Dr. Barbaraann Rondo is 7/27 at 140. Will schedule if that works for patient.

## 2018-09-06 NOTE — Telephone Encounter (Signed)
Regarding: Out of network with insurance?  ----- Message from Benjamine Mola sent at 09/06/2018  2:14 PM EDT -----  Maryln Manuel, MD    Pt called in stating that his insurance told him that you weren't in network with them. The only name that they gave that was in network was Dr. Barbaraann Rondo. He was wanting to see if it was possible to get an appointment with Dr. Barbaraann Rondo when he is in [place] for his every 3 months appointments. First available wasn't until 11-06-2018. Please call pt to advise. Thank you!

## 2018-09-11 ENCOUNTER — Ambulatory Visit (HOSPITAL_BASED_OUTPATIENT_CLINIC_OR_DEPARTMENT_OTHER): Payer: PRIVATE HEALTH INSURANCE | Admitting: Podiatrist

## 2018-09-11 ENCOUNTER — Other Ambulatory Visit: Payer: Self-pay

## 2018-09-11 ENCOUNTER — Ambulatory Visit: Payer: PRIVATE HEALTH INSURANCE | Attending: Family Medicine | Admitting: Family Medicine

## 2018-09-11 ENCOUNTER — Encounter (INDEPENDENT_AMBULATORY_CARE_PROVIDER_SITE_OTHER): Payer: Self-pay | Admitting: Podiatrist

## 2018-09-11 ENCOUNTER — Encounter (HOSPITAL_BASED_OUTPATIENT_CLINIC_OR_DEPARTMENT_OTHER): Payer: Self-pay | Admitting: Family Medicine

## 2018-09-11 VITALS — BP 151/93 | HR 77 | Temp 98.3°F | Ht 72.0 in | Wt 295.4 lb

## 2018-09-11 VITALS — BP 132/72 | HR 76 | Temp 97.9°F | Resp 16 | Ht 72.68 in | Wt 296.7 lb

## 2018-09-11 DIAGNOSIS — Z7984 Long term (current) use of oral hypoglycemic drugs: Secondary | ICD-10-CM | POA: Insufficient documentation

## 2018-09-11 DIAGNOSIS — B351 Tinea unguium: Secondary | ICD-10-CM

## 2018-09-11 DIAGNOSIS — E119 Type 2 diabetes mellitus without complications: Secondary | ICD-10-CM

## 2018-09-11 DIAGNOSIS — M1611 Unilateral primary osteoarthritis, right hip: Secondary | ICD-10-CM

## 2018-09-11 DIAGNOSIS — M069 Rheumatoid arthritis, unspecified: Secondary | ICD-10-CM | POA: Insufficient documentation

## 2018-09-11 DIAGNOSIS — L603 Nail dystrophy: Secondary | ICD-10-CM

## 2018-09-11 DIAGNOSIS — Z79899 Other long term (current) drug therapy: Secondary | ICD-10-CM | POA: Insufficient documentation

## 2018-09-11 DIAGNOSIS — M0630 Rheumatoid nodule, unspecified site: Secondary | ICD-10-CM

## 2018-09-11 DIAGNOSIS — E785 Hyperlipidemia, unspecified: Secondary | ICD-10-CM | POA: Insufficient documentation

## 2018-09-11 DIAGNOSIS — I10 Essential (primary) hypertension: Secondary | ICD-10-CM

## 2018-09-11 MED ORDER — METFORMIN 1,000 MG TABLET
500.00 mg | ORAL_TABLET | Freq: Two times a day (BID) | ORAL | 1 refills | Status: DC
Start: 2018-09-11 — End: 2019-04-26

## 2018-09-11 MED ORDER — CICLOPIROX 8 % TOPICAL SOLUTION
1.00 | Freq: Every evening | CUTANEOUS | 2 refills | Status: DC
Start: 2018-09-11 — End: 2019-04-26

## 2018-09-11 NOTE — Procedures (Signed)
[place] [place] [facility]  [place] [address]  [phone]  [phone]    Patient Name: [name]  MRN: [id number]  Date: 09/11/2018    Orthopaedic Faculty: Berniece Pap, DPM    Offloading Pads was applied to the bilateral  LE. Instructions were given on the application and care of the .  Instructions were given on proper skin care, signs and symptoms of skin problems and/or infection.  The patient was instructed to call the Orthopaedic Department with any questions or problems.  Emergency numbers were also given.  The patient verbalized understanding of the above.    Person applying the device: Lyndal Pulley, [facility] 09/11/2018, 10:46

## 2018-09-11 NOTE — Progress Notes (Signed)
Department of Family Medicine   Progress Note    [name]  MRN: [id number]  DOB: 09/11/1955  Date of Service: 09/11/2018    CHIEF COMPLAINT  Chief Complaint   Patient presents with   . Follow Up 3 Months       SUBJECTIVE  [name] is a 63 y.o. male who presents to clinic for follow up.     Patient has diabetes mellitus. Sugars are currently controlled. no hypoglycemic symptoms. Patient tolerating treatment regimen well and is compliant. Complications include: none.     Patient has hypertension. Patient is tolerating treatment regimen without side effects. Blood pressures at home are well controlled.     Patient with hyperlipidemia. This is well controlled on medication therapy. Denies side effects including myalgia, joint pain, headaches. Patient doing well without complaints related to lipids today.   Lab Results   Component Value Date    TRIG 103 06/13/2018    HDLCHOL 37 (L) 06/13/2018    LDLCHOL 50 06/13/2018    CHOLESTEROL 108 06/13/2018             Review of Systems:  Constitutional: negative for fevers, chills  Eyes: negative for vision changes  Ears, nose, mouth, throat, and face: negative for earaches, nasal congestion, sore throat  Respiratory: negative for cough, shortness of breath  Cardiovascular: negative for chest pain, palpitations  Gastrointestinal: negative for reflux symptoms, nausea, vomiting, diarrhea, constipation  Genitourinary: negative for dysuria  Integument: negative for rash  Musculoskeletal: negative for joint pain  Neurological: negative for headaches, numbness/tingling  Behavioral/Psych: negative for anxiety and depression  Endocrine: negative for excessive thirst, excessive urination    Medications:   ascorbic acid (VITAMIN C) 1,000 mg Oral Tablet, Take 1,000 mg by mouth Once a day  aspirin (ECOTRIN) 81 mg Oral Tablet, Delayed Release (E.C.), Take 81 mg by mouth Once a day  atorvastatin (LIPITOR) 40 mg Oral Tablet, Take 40 mg by mouth Every evening  calcium carbonate/vitamin  D3 (VITAMIN D-3 ORAL), Take by mouth 1000 IU daily  Ciclopirox 8 % Solution, Apply 1 Applicatorful topically Every night  FOLIC ACID ORAL, Take 1 mg by mouth Once a day   hydroxychloroquine (PLAQUENIL) 200 mg Oral Tablet, Take 200 mg by mouth Once a day   levothyroxine (SYNTHROID) 100 mcg Oral Tablet, Take 200 mcg by mouth Every morning Takes a total of 237 mcg daily   levothyroxine (SYNTHROID) 137 mcg Oral Tablet, Take 237 mcg by mouth Every morning  lidocaine (PF) 1% (10 mg/mL) Solution with methotrexate (PF) 25 mg/mL Solution 50 mg/m2, 25 mg/m2 by Intramuscular route Takes on saturdays  losartan (COZAAR) 50 mg Oral Tablet, Take 1 Tab (50 mg total) by mouth Once a day In am  MetFORMIN (GLUCOPHAGE) 1,000 mg Oral Tablet, Take 50 mg by mouth Twice daily with food   methotrexate, PF, 15 mg/0.3 mL Subcutaneous Auto-Injector, 15 mg by Subcutaneous route Every 7 days    No facility-administered medications prior to visit.       Allergies:   No Known Allergies    Social History     Tobacco Use   . Smoking status: Never Smoker   . Smokeless tobacco: Never Used   Substance Use Topics   . Alcohol use: Yes     Comment: 4 times a year   . Drug use: Never       OBJECTIVE  BP 132/72 (Site: Right, Patient Position: Sitting, Cuff Size: Adult Large)   Pulse 76   Temp  36.6 C (97.9 F) (Thermal Scan)   Resp 16   Ht 1.846 m (6' 0.68")   Wt 135 kg (296 lb 11.8 oz)   SpO2 98%   BMI 39.50 kg/m       General: no distress  HENT: TMs clear, mouth mucous membranes moist, pharynx without injection or exudate   Lungs: clear to auscultation bilaterally  Cardiovascular: RRR, no murmur  Abdomen: soft, non tender, bowel sounds present  Extremities: no cyanosis or edema  Skin: warm and dry, no rash  Neurologic: gait is normal, AOx3, CN 2-12 grossly intact  Psychiatric: normal affect and behavior    ASSESSMENT/PLAN  (I10) Essential hypertension  (primary encounter diagnosis)  Plan: well controlled  continue medication     (E78.5)  Hyperlipidemia, unspecified hyperlipidemia type  Plan: well controlled  Continue medication    (E11.9) Diabetes mellitus (CMS [place])  Plan: stable  Lab Results   Component Value Date    HA1C 6.1 (H) 06/13/2018     Continue medication, well controlled      will need ct chest in September 2020    Return in about 3 months (around 12/12/2018).      [name], MD 09/11/2018, 14:44

## 2018-09-12 ENCOUNTER — Ambulatory Visit: Payer: PRIVATE HEALTH INSURANCE | Attending: Family | Admitting: Family

## 2018-09-12 ENCOUNTER — Other Ambulatory Visit: Payer: Self-pay

## 2018-09-12 ENCOUNTER — Telehealth (HOSPITAL_BASED_OUTPATIENT_CLINIC_OR_DEPARTMENT_OTHER): Payer: PRIVATE HEALTH INSURANCE | Admitting: NURSE PRACTITIONER

## 2018-09-12 ENCOUNTER — Telehealth (HOSPITAL_BASED_OUTPATIENT_CLINIC_OR_DEPARTMENT_OTHER): Payer: Self-pay | Admitting: NURSE PRACTITIONER

## 2018-09-12 DIAGNOSIS — N183 Chronic kidney disease, stage 3 unspecified (CMS HCC): Secondary | ICD-10-CM

## 2018-09-12 DIAGNOSIS — I10 Essential (primary) hypertension: Secondary | ICD-10-CM

## 2018-09-12 DIAGNOSIS — C73 Malignant neoplasm of thyroid gland: Secondary | ICD-10-CM

## 2018-09-12 DIAGNOSIS — E119 Type 2 diabetes mellitus without complications: Secondary | ICD-10-CM

## 2018-09-12 DIAGNOSIS — E1122 Type 2 diabetes mellitus with diabetic chronic kidney disease: Secondary | ICD-10-CM | POA: Insufficient documentation

## 2018-09-12 DIAGNOSIS — I129 Hypertensive chronic kidney disease with stage 1 through stage 4 chronic kidney disease, or unspecified chronic kidney disease: Secondary | ICD-10-CM | POA: Insufficient documentation

## 2018-09-12 MED ORDER — LEVOTHYROXINE 200 MCG TABLET
ORAL_TABLET | ORAL | 3 refills | Status: DC
Start: 2018-09-12 — End: 2018-10-24

## 2018-09-12 NOTE — Progress Notes (Signed)
[name]  07/27/1955  [id number]  09/12/2018         This is a return patient telephone visit being conducted due to [place] [facility] precautions.  Patient agreeable to visit type and verbal consent for this service was obtained from the patient.     Last office visit in this department: 06/13/2018 with myself.      Reason for call: CKD stage 3 with risk factors of HTN, diabetes, h/o NSAID use with arthritis, and IV contrast dye exposures.  Creatinines ranging 1.4-1.7's since at least 2016.     Call notes:  Patient without issue or c/o.  BPs have been good.  He denies chest pain or shortness of breath.  Appetite is good.  No N/V.  Denies recent fever or chills.  No dysuria or hematuria.  Knows to avoid NSAID'S.  Diabetes is well controlled.  Continues on Metformin 500 bid.    Current Outpatient Medications   Medication Sig   . ascorbic acid (VITAMIN C) 1,000 mg Oral Tablet Take 1,000 mg by mouth Once a day   . aspirin (ECOTRIN) 81 mg Oral Tablet, Delayed Release (E.C.) Take 81 mg by mouth Once a day   . atorvastatin (LIPITOR) 40 mg Oral Tablet Take 40 mg by mouth Every evening   . calcium carbonate/vitamin D3 (VITAMIN D-3 ORAL) Take by mouth 1000 IU daily   . Ciclopirox 8 % Solution Apply 1 Applicatorful topically Every night   . FOLIC ACID ORAL Take 1 mg by mouth Once a day    . hydroxychloroquine (PLAQUENIL) 200 mg Oral Tablet Take 200 mg by mouth Once a day    . levothyroxine (SYNTHROID) 100 mcg Oral Tablet Take 200 mcg by mouth Every morning Takes a total of 237 mcg daily    . levothyroxine (SYNTHROID) 137 mcg Oral Tablet Take 237 mcg by mouth Every morning   . lidocaine (PF) 1% (10 mg/mL) Solution with methotrexate (PF) 25 mg/mL Solution 50 mg/m2 25 mg/m2 by Intramuscular route Takes on saturdays   . losartan (COZAAR) 50 mg Oral Tablet Take 1 Tab (50 mg total) by mouth Once a day In am   . MetFORMIN (GLUCOPHAGE) 1,000 mg Oral Tablet Take 0.5 Tabs (500 mg total) by mouth Twice daily with food   . methotrexate, PF, 15  mg/0.3 mL Subcutaneous Auto-Injector 15 mg by Subcutaneous route Every 7 days             Lab Results   Component Value Date    BUN 21 08/31/2018    BUN 21 08/31/2018    BUN 24 06/13/2018    BUN 38 05/25/2018    CREATININE 1.74 (H) 08/31/2018    CREATININE 1.74 (H) 08/31/2018    CREATININE 1.65 (H) 06/13/2018    CREATININE 1.77 05/25/2018    BUNCRRATIO 15 06/13/2018    BUNCRRATIO 21 05/25/2018    BUNCRRATIO 15 03/15/2018    BUNCRRATIO 18 12/01/2017    GFR 44 (L) 06/13/2018    GFR 40 05/25/2018    GFR 48 03/15/2018    GFR 56 12/01/2017    SODIUM 145 (H) 08/31/2018    SODIUM 145 (H) 08/31/2018    SODIUM 141 06/13/2018    SODIUM 141 05/25/2018    POTASSIUM 4.6 08/31/2018    POTASSIUM 4.6 08/31/2018    POTASSIUM 4.0 06/13/2018    POTASSIUM 5.0 05/25/2018    CHLORIDE 106 08/31/2018    CHLORIDE 106 08/31/2018    CHLORIDE 108 06/13/2018    CHLORIDE 106  05/25/2018    CO2 23 08/31/2018    CO2 23 08/31/2018    CO2 23 06/13/2018    CO2 21 05/25/2018    ANIONGAP 10 06/13/2018    ANIONGAP 10 06/16/2017    ANIONGAP 7 05/24/2017    CALCIUM 9.3 08/31/2018    CALCIUM 9.4 08/31/2018    CALCIUM 8.3 (L) 06/13/2018    CALCIUM 8.9 05/25/2018    PHOSPHORUS 3.2 08/31/2018    PHOSPHORUS 3.2 08/31/2018    PHOSPHORUS 3.1 05/25/2018    ALBUMIN 4.3 05/25/2018    HGB 13.8 08/31/2018    HGB 13.8 08/31/2018    HGB 13.3 05/25/2018    HGB 11.7 (L) 06/16/2017    HGB 12.6 06/15/2017    HGB 14.1 05/24/2017    HCT 40.4 08/31/2018    HCT 40.4 08/31/2018    HCT 39.6 05/25/2018    HCT 34.6 (L) 06/16/2017    HCT 37.2 06/15/2017    HCT 42.2 05/24/2017    INTACTPTH 44 08/31/2018    HA1C 6.1 (H) 06/13/2018       URINALYSIS  APPEARANCE (no units)   Date Value   08/31/2018 clear   08/31/2018 clear     COLOR (no units)   Date Value   08/31/2018 yellow   08/31/2018 yellow     SPECIFIC GRAVITY, URINE (no units)   Date Value   08/31/2018 1.024   08/31/2018 1.024     UROBILINOGEN (no units)   Date Value   08/31/2018 0.2   08/31/2018 0.2     KETONES (no units)   Date  Value   08/31/2018 neg   08/31/2018 neg     GLUCOSE (no units)   Date Value   08/31/2018 neg   08/31/2018 neg     BILIRUBIN (no units)   Date Value   08/31/2018 neg   08/31/2018 neg     BLOOD (no units)   Date Value   08/31/2018 trace (A)   08/31/2018 trace (A)     PROTEIN (no units)   Date Value   08/31/2018 neg   08/31/2018 neg     NITRITE (no units)   Date Value   08/31/2018 neg   08/31/2018 neg         Plan:  1.CKD stage 3 with risk factors of HTN, diabetes, h/o NSAID use with arthritis, and IV contrast dye exposures.  Creatinines ranging 1.4-1.7s since at least 2016.   Most recent creatinine level at 1.74.   Urine testing without overt proteinuria.   I reminded himon the importance of avoiding nephrotoxic agents such as NSAIDs and IV contrast dye exposure. I also reminded him on the importance of adequate hydration. Understanding was verbalized.    2. Anemia.  No clinical evidence.  H/H well within target.  3. Bone mineral disease.  PTH, vitamin D, calcium and phorphorus good.     4. HTN.  BP well controlled.  Continue current meds.  5.  Diabetes.  A1c's have been below 7. Continue to follow with endocrine.  No overt proteinuria.  He is on an ARB and should continue for renal protection.      RTC: 3 months.      Total provider time spent with the patient on the phone: 9 minutes.    Rene Kocher. Eulas Post, [facility]  Nephrology Nurse Practitioner  [facility]

## 2018-09-12 NOTE — Telephone Encounter (Signed)
Labs entered.  Placed in providers box for review. Tenna Lacko, MA  09/12/2018, 17:21

## 2018-09-12 NOTE — Telephone Encounter (Signed)
Reviewed in visit  Demonica Farrey K. Delshon Blanchfield, APRN  Family Nurse Practitioner-C  Section of Endocrinology

## 2018-09-12 NOTE — Progress Notes (Signed)
Department of Endocrinology  Virtual Visit     Name/MRN: [name], [id number] Date of service: 09/12/2018   Age/DOB: 63 y.o., September 30, 1955 Chief Complaint: PTC and Dm      VIRTUAL VISIT DOCUMENTATION:  Patient Location:  MyChart video visit from home address: [address] [place] [address]   Patient/family aware of provider location:  yes clinic   Patient/family consent for telemedicine:  yes  Examination observed and performed by:  Cesar Bowels, APRN,[name]    [name] is a 63 y.o. male with PMH of Diabetes, type 2 and PTC.  He has been followed by nephrology and podiatry in past month and here today for routine 3 month follow up. He lives in [place] and travels back to [place] due to insurance coverage.      PROBLEM LIST:  Patient Active Problem List    Diagnosis   . CKD (chronic kidney disease) stage 3, GFR 30-59 ml/min (CMS [facility])   . Essential hypertension   . Hypothyroidism   . Rheumatoid arthritis (CMS [place])   . Thyroid nodule   . Primary osteoarthritis of left hip   . Pain of left hip joint   . Osteoarthritis of right hip   . Primary osteoarthritis of right hip   . Type 2 diabetes mellitus with renal complication (CMS HCC)   . Rheumatoid arthritis involving right hip (CMS HCC)      ENDOCRINE PROGRESS NOTE     Cc: PTC and Diabetes, type 2       HPI:  Cesar. [name] is a 63 y.o. man has been seen in endocrine clinic for management of thyroid cancer and  Diabetes, type 2. He has been DM for past 10 years. He states is dealing with "nodules" on his skin/arm and also some in lungs.      He had thyroidectomy total in July  of 2018. He was seen by endocrinology of PTC.  He states was evaluated for RAI and told not to do it. He has pathology report [id number]. He doesn't believe he has had an neck u/s since since surgery. His last neck u/s was 11/28/17 and states he had one done in [place] "the other day". I have no report.  (I don't recommend outside u/s so not sure why in [place]).  He had u/s  thyroid/neck. He report there is something there that wasn't in past.     He was order for 237 mcg daily(100 mg and 137 mcg). He change his dose himself due to low TSH prior to last visit. He is taking 137 mcg and 50 mcg pil= 187 mcg. He now states his TSH is again low (I also don't have labs but states is less than 0.005) but he has been taking  200 mcg daily of LT4 for the past 2 months because of out of pills of other meds. I again counseled that he must not self medicate with levothyroxine. I counseled on hyper and hypothyroid complications.     He states can only do "things" on Sept 14-15 when he returns from [facility].  He is getting appts all on same day.  I advised I am full clinic and he will need to schedule out with me if he has specific dates of return. I will unlikely need to see him in Sept but Dec for 6 mon follow up.     He denies issues with low TSH-- no palpations, racing HR, or shaking hands. He has no loose stools  of an issues.  He denies weight gain or loss.  He dneis issues in neck size or shape. He has no swallowing or choking.   He has no sore anterior neck only posterior.     He has a stent placement a couple years ago. He had SOB and had stress test due to family hx. He had heart cath and came out with stent.   He has cluster of nodules under chest (sounds like in lungs) and some on arms which he is going to have removed. He states is not cancer.      He has no BS logs but states BS are 100-140, Fasting.  He is working on [employer]. He exercises when can.   He is only on metformin 1000 mg daily (500 mg in AM and PM).  He has been doing this for past 4-5 months. He states new PCP said to talk to me.  He thinks should stop metformin due to renal disease.   He is on Lipitor and tolerating well with no muscle aches or pains.  He has no hypoglycemia    He has gets eye exam but states he is over due. He still needs an eye exam.   He has no feet issues, no sores or ulcerations. He was seen by [facility]  09/11/18.Marland Kitchen     Past Medical History:  Past Medical History:   Diagnosis Date   . Arthritis    . Back problem    . Bruises easily    . Cancer (CMS [place])    . Diabetes (CMS [place])    . Heartburn    . HTN (hypertension)    . Hyperlipidemia    . Hypothyroid    . Kidney disease    . MI (myocardial infarction) (CMS [place])    . Neck problem    . Rheumatoid arthritis (CMS [place])    . Thyroid cancer (CMS [place])    . Type 2 diabetes mellitus (CMS [facility])    . Wears glasses      Social History:  No smoke  No ETOH  Married.  Lives in [place].     Family History:  No thyroid cancer   Some DM in father side of family     Meds:  Current Outpatient Medications   Medication Sig   . ascorbic acid (VITAMIN C) 1,000 mg Oral Tablet Take 1,000 mg by mouth Once a day   . aspirin (ECOTRIN) 81 mg Oral Tablet, Delayed Release (E.C.) Take 81 mg by mouth Once a day   . atorvastatin (LIPITOR) 40 mg Oral Tablet Take 40 mg by mouth Every evening   . calcium carbonate/vitamin D3 (VITAMIN D-3 ORAL) Take by mouth 1000 IU daily   . Ciclopirox 8 % Solution Apply 1 Applicatorful topically Every night   . FOLIC ACID ORAL Take 1 mg by mouth Once a day    . hydroxychloroquine (PLAQUENIL) 200 mg Oral Tablet Take 200 mg by mouth Once a day    . levothyroxine (SYNTHROID) 100 mcg Oral Tablet Take 200 mcg by mouth Every morning Takes a total of 237 mcg daily    . levothyroxine (SYNTHROID) 137 mcg Oral Tablet Take 237 mcg by mouth Every morning   . lidocaine (PF) 1% (10 mg/mL) Solution with methotrexate (PF) 25 mg/mL Solution 50 mg/m2 25 mg/m2 by Intramuscular route Takes on saturdays   . losartan (COZAAR) 50 mg Oral Tablet Take 1 Tab (50 mg total) by mouth Once a day In  am   . MetFORMIN (GLUCOPHAGE) 1,000 mg Oral Tablet Take 0.5 Tabs (500 mg total) by mouth Twice daily with food   . methotrexate, PF, 15 mg/0.3 mL Subcutaneous Auto-Injector 15 mg by Subcutaneous route Every 7 days     No Known Allergies      ROS:  He has no polyuria or polydipsia and all other systems were  reviewed and are negative except as mentioned above in HPI.        Physical Exam:  There were no vitals filed for this visit.  Full Exam Limited as Encounter is Virtual  General: Well appearing /male in no acute distress.  Psychiatric: Normal mood and affect  Neuro: Alert and oriented x 3. Speech fluent. Cranial nerves grossly II-XII intact.   HEENT: Head normocephalic, atraumatic. PERRLA, EOMI, conjunctiva clear. No proptosis.  Thyroid/Neck: Trachea midline, no visible nodules/thyroid enlargement  Lungs: Normal respiratory effort.   CV: no cyanosis noted.  Extremities: No edema or erythema noted.  Skin: No visible rashes.  Diabetic foot exam:  06/2018      Labs/Studies/Imaging:  OBTAINED TSH after visit:  TSH 09/06/18 <0.005      Results for SAMVEL, ZINN (MRN [id number]) as of 09/12/2018 11:32   Ref. Range 06/13/2018 12:01 06/14/2018 12:36 08/31/2018 00:00 08/31/2018 00:00   SODIUM Unknown 141  145 (H) 145 (H)   POTASSIUM Unknown 4.0  4.6 4.6   CHLORIDE Unknown 108  106 106   CARBON DIOXIDE Unknown _0 BUN Unknown _1 CREATININE Unknown 1.65 (H)  1.74 (H) 1.74 (H)   GLUCOSE Latest Ref Range: 65 - 139 mg/dL 133      ANION GAP Latest Ref Range: 4 - 13 mmol/L 10      BUN/CREAT RATIO Latest Ref Range: 6 - 22  15      ESTIMATED GLOMERULAR FILTRATION RATE Latest Ref Range: >60 mL/min/1.8m2 44 (L)      CALCIUM Unknown 8.3 (L)  9.3 9.4   PHOSPHORUS Unknown   3.2 3.2   CHOLESTEROL Latest Ref Range: <200 mg/dL 108      HDL-CHOLESTEROL Latest Ref Range: >=39 mg/dL 37 (L)      LDL (CALCULATED) Latest Ref Range: <=100 mg/dL 50      TRIGLYCERIDES Latest Ref Range: <=150 mg/dL 103      VLDL (CALCULATED) Latest Ref Range: <30 mg/dL 21      CHOL/HDL RATIO Unknown 2.9      NON - HDL (CALCULATED) Latest Ref Range: <=190 mg/dL 71      TSH Latest Ref Range: 0.350 - 5.000 uIU/mL 1.722      INTACT PTH Unknown   44    THYROGLOBULIN, TUMOR MARKER REFLEX TO LC-MS/MS OR IMMUNOASSAY Unknown  <1.8     THYROGLOBULIN, TUMOR MARKER, IA,  S REFLEX ONLY Unknown  0.4     AST (SGOT) Latest Ref Range: 8 - 48 U/L 13      ALT (SGPT) Latest Ref Range: <55 U/L 20      ALBUMIN (SERUM) Unknown   4.2 4.2   25-HYDROXY VITAMIN D Unknown   46.5 46.5   HEMOGLOBIN A1C Latest Ref Range: 4.0 - 5.6 % 6.1 (H)      ESTIMATED AVERAGE GLUCOSE Latest Units: mg/dL 128          OUTSIDE LAB  03/15/18  TSH 0.33  Thyroglobulin Tg <2.0 ng/ml  Thyroglobulin antibody <1.0 IU/ml     08/03/17   TSH <0.006  Free T4 2.89  Thyroglobulin antibody <1.0  Thyroglobulin by IMA 0.1 ng/ml                   Imaging:   OBTAINED THIS U/S report after patient left the visit              Assessment and Plan:  [name] is a 63 y.o. man with Diabetes, type 2 on metformin only with hx of MI/stents. He also here due to [facility], total thyroidectomy 10/2016 with no RAi. This is follow up s/p ultrasound. Report obtained after visit. He travels to [facility] for his health care.   PTC, s/p total thyroidectomy ; post surgical hypothyroidism; diabetes, type 2, controlled; dyslipidemia; HTN, hx of MI/Stents; possible DM Retinopathy and/or neuropathy.    Counseled on thyroid cancer regarding follow up and plan of treatment. There is 5 year suppressive therapy of TSH. The goal TSH is to be close to 0.1-0.3 but for low risk <2.0.  He needs to stop adjusting doses himself.  I advised to change to 200 mcg daily 6 days/week and then 7th only 100 mcg.  He will check TSH again in 4 weeks. Will send labs to patients home in [place]>    Will monitor thyroglobulin tumor markers and antibodies nonstimulated due to no RAI treatment. Goal is zero proteins found but very low is acceptable due to no RAI. In [place] lab, in March 2020 was 0.4 which is up from prior outside labs of 0.1. Will need to obtain U/S.     Lastly, ultrasound will be monitored Q6 months or PRN with concerns to assess thyroid bed and lymph nodes of the neck and compare in relation to Tg marker and how it is trending. I will return call to advise when obtain  reports.   DM management-- work harder with life style (exercise and lower carb diet) and will stop metformin due to falling GFR. His A1c was good at 6.0. He needs to obtain with next (4 weeks) TSH.    Aware needs eye exam and daily monitoring of feet.    Depending on A1C and  Renal function will consider new drug---GLP or SGLT2 with cardiovascular risk benefits due to hx of MI.    Follow up 6 motnhs with neck u/s or sooner if issues with pending results of neck u/s from [place].   Orders Placed This Encounter   . Tsh   . HGA1C (HEMOGLOBIN A1C WITH EST AVG GLUCOSE)   . levothyroxine (SYNTHROID) 200 mcg Oral Tablet       Addendum:  Due to obtaining outside u/s I would like him to repeat in 3 months at [facility] and repeat Tg marker. I have called and advised and placed orders.     I have discussed the above with the patient and answered all of her questions to her satisfaction.  She is in agreement with the above plan.    I have indicated to the patient that she will be contacted with any lab/study results but, in the case that she doesn't hear from this department after about a week of doing any labs or studies, she should contact me.     Thank you for the opportunity to work with this pleasant patient.  Please feel free to contact me with any questions or concerns via my chart or phone.    The patient was seen independently.    Melissa K. Hardin Negus, [name]  Family Nurse Practitioner, C  Section of Endocrinology              "  I have not seen this patient nor reviewed the chart nor the note.  I am signing this note as a [place] requirement only and not because I have had anything to do with this patient's care.  Bridgett Larsson, MD 09/14/2018 "

## 2018-09-13 ENCOUNTER — Other Ambulatory Visit (HOSPITAL_BASED_OUTPATIENT_CLINIC_OR_DEPARTMENT_OTHER): Payer: Self-pay | Admitting: Orthopaedic Surgery

## 2018-09-13 ENCOUNTER — Ambulatory Visit (HOSPITAL_BASED_OUTPATIENT_CLINIC_OR_DEPARTMENT_OTHER): Payer: Self-pay

## 2018-09-13 ENCOUNTER — Ambulatory Visit (HOSPITAL_BASED_OUTPATIENT_CLINIC_OR_DEPARTMENT_OTHER): Payer: PRIVATE HEALTH INSURANCE

## 2018-09-13 ENCOUNTER — Encounter (HOSPITAL_BASED_OUTPATIENT_CLINIC_OR_DEPARTMENT_OTHER): Payer: Self-pay | Admitting: Orthopaedic Surgery

## 2018-09-13 ENCOUNTER — Ambulatory Visit: Payer: PRIVATE HEALTH INSURANCE | Attending: Orthopaedic Surgery | Admitting: Orthopaedic Surgery

## 2018-09-13 DIAGNOSIS — M0639 Rheumatoid nodule, multiple sites: Secondary | ICD-10-CM

## 2018-09-13 DIAGNOSIS — M06322 Rheumatoid nodule, left elbow: Secondary | ICD-10-CM

## 2018-09-13 DIAGNOSIS — R2232 Localized swelling, mass and lump, left upper limb: Secondary | ICD-10-CM | POA: Insufficient documentation

## 2018-09-13 NOTE — OR Surgeon (Addendum)
[facility]  OPERATIVE NOTE    Attending Surgeon:  Lyla Son, MD  Assistant(s):  none     Name:   [name]  DOB:  [birth year]  MRN:    [id number]  Anesthesia: Local    Date of Surgery:  09/13/2018   Pre-Op. Diagnosis:  Left elbow and forearm masses   Post-Op. Diagnosis:  Same as preoperative diagnosis  Procedures:   Excision or biopsies    Estimated Blood Loss: minimal   Blood Given: none  Wound Class:   Clean  Complications:  None   Drains/Drains:  none  Specimens/ Cultures:  Pathology  Disposition:   Home  Condition: stable               DETAILS OF PROCEDURE  I identified the patient, [name] , in the procedure room. We reviewed the surgical plan and the written consent. The patient asked to proceed with the surgery as planned. I marked the operative site.  I administered local anesthesia using lidocaine 1% with epinephrine and bicarbonate in the planned surgical incisions under sterile conditions.  Time-out was performed. The upper extremity was prepped using alcohol and Chloraprep and draped in a sterile fashion.   I performed a medial incision over the olecranon.  I raised full-thickness skin flaps.  Identified the mass under the skin.  It was solid in nature around, but with no smooth borders.  I excised it and sent it to pathology.  The mass was 3 cm in maximum dimension I performed a bursectomy of the olecranon.  I also excised a piece of skin to allow better approximation of the flaps.  I closed the wound in layers.    I made another longitudinal incision separate from the original one over the proximal forearm mass over the dorsal aspect.  I raised full-thickness flaps.  Identified the subcutaneous mass.  It had two component, one fatty in nature compatible with lipoma and another one that is poorly defined very similar to the mass I removed from the olecranon area.  I excised him and send them to pathology.  The combined maximum length of the masses was 3 cm.  I encountered  a small arterial bleeder which I ligated with Vicryl suture.  I irrigated the wound and closed it in layers.    The patient is aware that the both masses may recur specially the rheumatoid nodules.  He is also aware that he may have to pathologies in the forearm including a lipoma and another solid mass, compatible with a rheumatoid nodule.  I applied sterile dressing.  The patient tolerated the procedure well and left the procedure room in a good condition.    The patient will follow up in 1 week for a wound check. S/he has the option to send a photo of the incision(s) and communicate using the Mychart App instead of an in-person visit.     Lyla Son, MD  Assistant Professor  Hand, Elbow, and Shoulder Surgery  Orthopedic Sports Medicine

## 2018-09-14 ENCOUNTER — Ambulatory Visit (HOSPITAL_BASED_OUTPATIENT_CLINIC_OR_DEPARTMENT_OTHER): Payer: Self-pay | Admitting: NURSE PRACTITIONER

## 2018-09-14 ENCOUNTER — Encounter (HOSPITAL_BASED_OUTPATIENT_CLINIC_OR_DEPARTMENT_OTHER): Payer: Self-pay | Admitting: Orthopaedic Surgery

## 2018-09-14 NOTE — Telephone Encounter (Signed)
I left you a voice message on cell phone, in detail  Here are highlights:  1. Continue with levothyroxine dose discussed in visit.  2. TSH in 4 week for further advisement. (TSH was much too low, getting too much med).  3. U/s from [place] does have a new (mix echogenicity about 1 cm and a calcification).  I would like you to repeat u/s at [facility] in Sept 14-15 when you are here.  4. Labs while here, Tg marker and TSH.    5. I will call you after all results are back. I don't need to see you.  6.Virtual follow up in December.   Hannan Tetzlaff K. Hardin Negus, APRN  Family Nurse Practitioner-C  Section of Endocrinology

## 2018-09-15 NOTE — Nursing Note (Signed)
Left   Patient to procedure room at   13:17   Procedure name  Left elbow and forearm nodule excisions   Local Anesthesia administered by Surgeon  of a mixture of 9cc Lidocaine with epi 1:100,000 and 1cc of sodium bicarbonate 4.3%  10cc    Local Anesthesia Administration time  13:12   Patient was prepped and draped in sterile fashion  No inconsistencies noted.   Timeout was performed at ____. Patient, procedure, and right site verified.  13:25. No inconsistencies noted.    Incision time  13:22   Procedure completed  13:35    dressing applied  13:49   Patient instructions .  keep wound clean and dry. Follow up with Surgeon    Patient released to family/friend  self

## 2018-09-18 ENCOUNTER — Encounter (HOSPITAL_BASED_OUTPATIENT_CLINIC_OR_DEPARTMENT_OTHER): Payer: Self-pay | Admitting: Orthopaedic Surgery

## 2018-09-18 ENCOUNTER — Telehealth (HOSPITAL_BASED_OUTPATIENT_CLINIC_OR_DEPARTMENT_OTHER): Payer: Self-pay | Admitting: NURSE PRACTITIONER

## 2018-09-18 LAB — HISTORICAL SURGICAL PATHOLOGY SPECIMEN

## 2018-09-18 NOTE — Telephone Encounter (Addendum)
Bone Density Scan & Thyroid Ultrasound received, placed in Provider's box to review then will scan to chart.  Merleen Milliner, [place]  09/18/2018, 11:44

## 2018-09-18 NOTE — H&P (Signed)
[place]  [place], [place] [address]    PATIENT NAME:  [name]  DATE OF BIRTH:  1955/05/26  MRN:  [id number]  DATE OF SERVICE:  09/11/2018    HISTORY OF PRESENT ILLNESS:   63 y.o. White male presents for diabetic foot exam as well as complaint of pain to both feet.  Patient is a type 2 diabetic with most current HgbA1c 6.1 06/13/2018.  Also with hx rheumatoid arthritis with rheumatoid nodules to bilateral 5th metatarsal heads, left arm chest and lung.  Denies any hx of wounds or surgeries to his feet.  Hx of right hip replacement and pending left hip replacement.  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  Denies tobacco use.    PAST MEDICAL HISTORY:   Past Medical History:   Diagnosis Date   . Arthritis    . Back problem    . Bruises easily    . Cancer (CMS [place])    . Diabetes (CMS [place])    . Heartburn    . HTN (hypertension)    . Hyperlipidemia    . Hypothyroid    . Kidney disease    . MI (myocardial infarction) (CMS [place])    . Neck problem    . Rheumatoid arthritis (CMS [place])    . Thyroid cancer (CMS [place])    . Type 2 diabetes mellitus (CMS HCC)    . Wears glasses      PAST SURGICAL HISTORY:   Past Surgical History:   Procedure Laterality Date   . Colonoscopy     . Hx heart catheterization     . Hx wrist fracture tx Right    . Knee surgery Bilateral    . Shoulder surgery Bilateral    . Total thyroidectomy       MEDICATIONS:   Current Outpatient Medications   Medication Sig   . ascorbic acid (VITAMIN C) 1,000 mg Oral Tablet Take 1,000 mg by mouth Once a day   . aspirin (ECOTRIN) 81 mg Oral Tablet, Delayed Release (E.C.) Take 81 mg by mouth Once a day   . atorvastatin (LIPITOR) 40 mg Oral Tablet Take 40 mg by mouth Every evening   . calcium carbonate/vitamin D3 (VITAMIN D-3 ORAL) Take by mouth 1000 IU daily   . Ciclopirox 8 % Solution Apply 1 Applicatorful topically Every night      . FOLIC ACID ORAL Take 1 mg by mouth Once a day    . hydroxychloroquine (PLAQUENIL) 200 mg Oral Tablet Take 200 mg by mouth Once a day    . levothyroxine (SYNTHROID) 200 mcg Oral Tablet 200 mcg daily 6 days/week and then 7 th day is 1/2 tabl (100 mcg) daily.   Marland Kitchen lidocaine (PF) 1% (10 mg/mL) Solution with methotrexate (PF) 25 mg/mL Solution 50 mg/m2 25 mg/m2 by Intramuscular route Takes on saturdays   . losartan (COZAAR) 50 mg Oral Tablet Take 1 Tab (50 mg total) by mouth Once a day In am   . MetFORMIN (GLUCOPHAGE) 1,000 mg Oral Tablet Take 0.5 Tabs (500 mg total) by mouth Twice daily with food (Patient not taking: Reported on 09/13/2018)   . methotrexate, PF, 15 mg/0.3 mL Subcutaneous Auto-Injector 15 mg by Subcutaneous route Every 7 days     ALLERGIES: No Known Allergies  FAMILY MEDICAL HISTORY:   Family Medical History:     Problem  Relation (Age of Onset)    No Known Problems Mother, Father        SOCIAL HISTORY:   Social History     Occupational History   . Not on file   Tobacco Use   . Smoking status: Never Smoker   . Smokeless tobacco: Never Used   Substance and Sexual Activity   . Alcohol use: Yes     Comment: 4 times a year   . Drug use: Never   . Sexual activity: Not on file       REVIEW OF SYSTEMS:   Denies fevers, chills, nausea, vomiting, and shortness of breath.  All other systems reviewed and are negative, except as reported in HPI.      PHYSICAL EXAM:   General:  Appears stated age.  Well-appearing.    Psychiatric:  Pleasant.  Cooperative.  Alert and oriented x 3.    Respiratory:  Lung expansion symmetric.  No active work of breathing.    Heme/Lymphatic:  No lymphadenopathy.    ENMT:  Sclera clear.  Trachea midline.    With face mask.  Vascular:  +2/4 dorsalis pedis and posterior tibial pulses, bilaterally.  Capillary fill time was less than 3 seconds, bilaterally.  Skin temperature was warm at the level of the toes, bilaterally.  There was no edema noted.   Positive hair growth noted to bilateral  tibia.  Skin was of good turgor and texture.    Neurologic:  Gross sensation reduced to touch, bilaterally  At level of toes.  No paresthesias noted, bilateral feet.    absent vibratory sensation, bilaterally to level of MPJ.   Decreased  sensation with Semmes-Weinstein 5.07 monofilament to level of the toes bilateral.    Dermatologic: Small focal areas of fluid filled blisters including left medial midfoot arch, left 2nd digit tuft, left hallux distal tuft.    Soft tissue masses noted around 5th metatarsal heads with right larger than left. No ecchymosis or erythema noted, bilateral feet.    Right hallux nail noted to be thickened, dystrophic and with subungual debris.  Remaining nails are normotrophic bilateral. No maceration noted to interspaces.    Musculoskeletal:  Prominent right lateral 5th metatarsal base. +5/5 dorsiflexion, plantarflexion, inversion, eversion, intrinsic muscle strength, bilaterally.  Full range of motion, all quadrants, bilaterally.    No pain with range of motion.   No  crepitus noted.        ASSESSMENT:   1. DM 2 (HgbA1c 6.1 06/13/2018)  2. Diabetic peripheral neuropathy with decrease in protective sensation noted at level of toes bilateral   3. Bilateral 5th metatarsal head area rheumatoid nodules; right larger than left  4. Multiple small areas of fluid filled blisters; underlying cause unknown.  Painless    PLAN:   - Discussed clinical  Exam findings and answered all questions.    - Discussed diabetic foot care and daily at home foot exams.  - Advised patient to wear soled shoes/slippers inside and outside of home and avoid bare or stocking feet  - Discussed conservative treatment for painless onychomycosis including use of nail file, OTC and prescription antifungals.  - Prescription for Ciclopirox 8% to be applied to affected nail nightly  - Discussed offload padding for rheumatoid nodules, footwear adjustment, activity adjustment  - Bilateral offload pads made  - RTC 1 year for  diabetic foot exam    This note may have been partially generated using MModal Fluency Direct system and there may be some incorrect words,  spellings, and punctuation that were not noted in checking the note before saving.        Curt Bears Aleene Davidson  Podiatry, Assistant Professor  Department of [place] of [facility]  Pager [address]    Berniece Pap, [place]  09/18/2018, 16:28

## 2018-09-29 ENCOUNTER — Encounter (HOSPITAL_BASED_OUTPATIENT_CLINIC_OR_DEPARTMENT_OTHER): Payer: Self-pay | Admitting: Orthopaedic Surgery

## 2018-10-03 ENCOUNTER — Encounter (HOSPITAL_BASED_OUTPATIENT_CLINIC_OR_DEPARTMENT_OTHER): Payer: Self-pay | Admitting: Family Medicine

## 2018-10-03 NOTE — Addendum Note (Signed)
Addended by: Harriett Sine on: 10/03/2018 12:29 PM     Modules accepted: Orders

## 2018-10-13 ENCOUNTER — Encounter (HOSPITAL_BASED_OUTPATIENT_CLINIC_OR_DEPARTMENT_OTHER): Payer: Self-pay | Admitting: Family Medicine

## 2018-10-21 ENCOUNTER — Encounter (HOSPITAL_BASED_OUTPATIENT_CLINIC_OR_DEPARTMENT_OTHER): Payer: Self-pay | Admitting: NURSE PRACTITIONER

## 2018-10-24 ENCOUNTER — Ambulatory Visit (HOSPITAL_COMMUNITY): Payer: PRIVATE HEALTH INSURANCE

## 2018-10-24 ENCOUNTER — Other Ambulatory Visit: Payer: Self-pay

## 2018-10-24 ENCOUNTER — Other Ambulatory Visit (HOSPITAL_COMMUNITY): Payer: Self-pay

## 2018-10-24 ENCOUNTER — Other Ambulatory Visit (HOSPITAL_BASED_OUTPATIENT_CLINIC_OR_DEPARTMENT_OTHER): Payer: Self-pay | Admitting: NURSE PRACTITIONER

## 2018-10-24 ENCOUNTER — Ambulatory Visit (HOSPITAL_BASED_OUTPATIENT_CLINIC_OR_DEPARTMENT_OTHER): Payer: PRIVATE HEALTH INSURANCE | Admitting: Rheumatology

## 2018-10-24 ENCOUNTER — Ambulatory Visit
Admission: RE | Admit: 2018-10-24 | Discharge: 2018-10-24 | Disposition: A | Discharge status: Home / Self Care | Payer: PRIVATE HEALTH INSURANCE | Source: Ambulatory Visit

## 2018-10-24 DIAGNOSIS — Z79899 Other long term (current) drug therapy: Secondary | ICD-10-CM

## 2018-10-24 DIAGNOSIS — M0579 Rheumatoid arthritis with rheumatoid factor of multiple sites without organ or systems involvement: Secondary | ICD-10-CM

## 2018-10-24 DIAGNOSIS — N182 Chronic kidney disease, stage 2 (mild): Secondary | ICD-10-CM

## 2018-10-24 DIAGNOSIS — M810 Age-related osteoporosis without current pathological fracture: Secondary | ICD-10-CM

## 2018-10-24 DIAGNOSIS — C73 Malignant neoplasm of thyroid gland: Secondary | ICD-10-CM

## 2018-10-24 DIAGNOSIS — M19071 Primary osteoarthritis, right ankle and foot: Secondary | ICD-10-CM | POA: Insufficient documentation

## 2018-10-24 DIAGNOSIS — M19072 Primary osteoarthritis, left ankle and foot: Secondary | ICD-10-CM | POA: Insufficient documentation

## 2018-10-24 DIAGNOSIS — M19041 Primary osteoarthritis, right hand: Secondary | ICD-10-CM | POA: Insufficient documentation

## 2018-10-24 DIAGNOSIS — E119 Type 2 diabetes mellitus without complications: Secondary | ICD-10-CM

## 2018-10-24 LAB — THYROID STIMULATING HORMONE (SENSITIVE TSH): TSH: 0.038 u[IU]/mL — ABNORMAL LOW (ref 0.350–5.000)

## 2018-10-24 MED ORDER — LEVOTHYROXINE 175 MCG TABLET
175.00 ug | ORAL_TABLET | Freq: Every morning | ORAL | 1 refills | Status: DC
Start: 2018-10-24 — End: 2018-10-26

## 2018-10-24 NOTE — Progress Notes (Signed)
HI   Your TSH is still too low.  Please change levothyroxine dose to 175 mcg daily.  (Your prior dosing was 185 mcg).   I have sent a new prescription to pharmacy. Please check TSH again in 5-6 weeks  Benjimen Kelley K. Hardin Negus, APRN  Family Nurse Practitioner-C  Section of Endocrinology

## 2018-10-26 ENCOUNTER — Encounter (HOSPITAL_BASED_OUTPATIENT_CLINIC_OR_DEPARTMENT_OTHER): Payer: Self-pay | Admitting: NURSE PRACTITIONER

## 2018-10-26 ENCOUNTER — Other Ambulatory Visit (HOSPITAL_BASED_OUTPATIENT_CLINIC_OR_DEPARTMENT_OTHER): Payer: Self-pay | Admitting: NURSE PRACTITIONER

## 2018-10-26 MED ORDER — LEVOTHYROXINE 175 MCG TABLET
ORAL_TABLET | ORAL | 1 refills | Status: DC
Start: 2018-10-26 — End: 2018-12-20

## 2018-10-26 NOTE — Telephone Encounter (Signed)
-----   Message from Frederico Hamman sent at 10/26/2018  2:38 PM EDT -----  Julius Bowels, APRN,NP-C     Pharmacy calling in regards to the following rx. States they need clarification on instructions and dosage. Please call to advise.    levothyroxine (SYNTHROID) 175 mcg Oral Tablet

## 2018-10-26 NOTE — Telephone Encounter (Signed)
Returned call to pharmacist. [facility] script per providers result note. Script was to be Levo 152mcg, take one tablet (177mcg) by mouth daily. Dispense 90 with 1 refill. Pharmacist verbalized understanding and denied any questions. Script fixed in snapshot for future reference. Loyce Dys  10/26/2018, 14:55

## 2018-10-28 ENCOUNTER — Encounter (HOSPITAL_BASED_OUTPATIENT_CLINIC_OR_DEPARTMENT_OTHER): Payer: Self-pay | Admitting: Rheumatology

## 2018-10-30 NOTE — Telephone Encounter (Signed)
-----   Message from Orpah Cobb sent at 10/28/2018 12:06 PM EDT -----  Regarding: Non-Urgent Medical Question  Contact: [phone]  I had xrays of my wrists and feet at [facility]  a week ago. I'm a new patient you will be seeing the week of September 14th. You might want to look at them before I come. Thanks. See you in September

## 2018-12-12 ENCOUNTER — Encounter (HOSPITAL_BASED_OUTPATIENT_CLINIC_OR_DEPARTMENT_OTHER): Payer: Self-pay | Admitting: Family Medicine

## 2018-12-18 ENCOUNTER — Other Ambulatory Visit: Payer: Self-pay

## 2018-12-18 ENCOUNTER — Ambulatory Visit
Admission: RE | Admit: 2018-12-18 | Discharge: 2018-12-18 | Disposition: A | Discharge status: Home / Self Care | Payer: PRIVATE HEALTH INSURANCE | Source: Ambulatory Visit | Attending: NURSE PRACTITIONER | Admitting: NURSE PRACTITIONER

## 2018-12-18 ENCOUNTER — Ambulatory Visit (HOSPITAL_BASED_OUTPATIENT_CLINIC_OR_DEPARTMENT_OTHER): Payer: PRIVATE HEALTH INSURANCE | Admitting: Family Medicine

## 2018-12-18 ENCOUNTER — Encounter (HOSPITAL_BASED_OUTPATIENT_CLINIC_OR_DEPARTMENT_OTHER): Payer: Self-pay | Admitting: NURSE PRACTITIONER

## 2018-12-18 ENCOUNTER — Ambulatory Visit (HOSPITAL_BASED_OUTPATIENT_CLINIC_OR_DEPARTMENT_OTHER): Payer: PRIVATE HEALTH INSURANCE | Admitting: Rheumatology

## 2018-12-18 ENCOUNTER — Encounter (HOSPITAL_BASED_OUTPATIENT_CLINIC_OR_DEPARTMENT_OTHER): Payer: Self-pay | Admitting: Rheumatology

## 2018-12-18 ENCOUNTER — Encounter (HOSPITAL_BASED_OUTPATIENT_CLINIC_OR_DEPARTMENT_OTHER): Payer: Self-pay | Admitting: Family Medicine

## 2018-12-18 VITALS — BP 122/80 | Temp 96.6°F | Ht 72.0 in | Wt 280.4 lb

## 2018-12-18 DIAGNOSIS — C73 Malignant neoplasm of thyroid gland: Secondary | ICD-10-CM | POA: Insufficient documentation

## 2018-12-18 DIAGNOSIS — M05751 Rheumatoid arthritis with rheumatoid factor of right hip without organ or systems involvement: Secondary | ICD-10-CM

## 2018-12-18 DIAGNOSIS — M069 Rheumatoid arthritis, unspecified: Secondary | ICD-10-CM | POA: Insufficient documentation

## 2018-12-18 DIAGNOSIS — E785 Hyperlipidemia, unspecified: Secondary | ICD-10-CM | POA: Insufficient documentation

## 2018-12-18 DIAGNOSIS — E119 Type 2 diabetes mellitus without complications: Secondary | ICD-10-CM | POA: Insufficient documentation

## 2018-12-18 DIAGNOSIS — R590 Localized enlarged lymph nodes: Secondary | ICD-10-CM | POA: Insufficient documentation

## 2018-12-18 DIAGNOSIS — I10 Essential (primary) hypertension: Secondary | ICD-10-CM | POA: Insufficient documentation

## 2018-12-18 DIAGNOSIS — R911 Solitary pulmonary nodule: Secondary | ICD-10-CM | POA: Insufficient documentation

## 2018-12-18 LAB — COMPREHENSIVE METABOLIC PANEL, NON-FASTING
ALBUMIN: 3.5 g/dL (ref 3.4–4.8)
ALKALINE PHOSPHATASE: 81 U/L (ref 45–115)
ALT (SGPT): 16 U/L (ref <55)
ANION GAP: 10 mmol/L (ref 4–13)
AST (SGOT): 13 U/L (ref 8–48)
BILIRUBIN TOTAL: 0.3 mg/dL (ref 0.3–1.3)
BUN/CREA RATIO: 15 (ref 6–22)
BUN: 25 mg/dL (ref 8–25)
CALCIUM: 8.6 mg/dL (ref 8.5–10.2)
CHLORIDE: 107 mmol/L (ref 96–111)
CO2 TOTAL: 23 mmol/L (ref 22–32)
CREATININE: 1.66 mg/dL — ABNORMAL HIGH (ref 0.62–1.27)
ESTIMATED GFR: 43 mL/min/{1.73_m2} — ABNORMAL LOW (ref >60)
GLUCOSE: 104 mg/dL (ref 65–139)
POTASSIUM: 4.3 mmol/L (ref 3.5–5.1)
PROTEIN TOTAL: 6.5 g/dL (ref 6.0–8.0)
SODIUM: 140 mmol/L (ref 136–145)

## 2018-12-18 LAB — CBC WITH DIFF
BASOPHIL #: <0.1 10*3/uL (ref <=0.20)
BASOPHIL %: 1 %
EOSINOPHIL #: 0.25 10*3/uL (ref <=0.50)
EOSINOPHIL %: 5 %
HCT: 37.6 % — ABNORMAL LOW (ref 38.9–52.0)
HGB: 12.7 g/dL — ABNORMAL LOW (ref 13.4–17.5)
IMMATURE GRANULOCYTE #: <0.1 10*3/uL (ref <0.10)
IMMATURE GRANULOCYTE %: 0 % (ref 0–1)
LYMPHOCYTE #: 0.67 10*3/uL — ABNORMAL LOW (ref 1.00–4.80)
LYMPHOCYTE %: 13 %
MCH: 32.2 pg — ABNORMAL HIGH (ref 26.0–32.0)
MCHC: 33.8 g/dL (ref 31.0–35.5)
MCV: 95.2 fL (ref 78.0–100.0)
MONOCYTE #: 0.39 10*3/uL (ref 0.20–1.10)
MONOCYTE %: 8 %
MPV: 11.2 fL (ref 8.7–12.5)
NEUTROPHIL #: 3.62 10*3/uL (ref 1.50–7.70)
NEUTROPHIL %: 73 %
PLATELETS: 167 10*3/uL (ref 150–400)
RBC: 3.95 10*6/uL — ABNORMAL LOW (ref 4.50–6.10)
RDW-CV: 12.7 % (ref 11.5–15.5)
WBC: 5 10*3/uL (ref 3.7–11.0)

## 2018-12-18 LAB — HGA1C (HEMOGLOBIN A1C WITH EST AVG GLUCOSE)
ESTIMATED AVERAGE GLUCOSE: 134 mg/dL
HEMOGLOBIN A1C: 6.3 % — ABNORMAL HIGH (ref 4.0–5.6)

## 2018-12-18 LAB — THYROID STIMULATING HORMONE (SENSITIVE TSH): TSH: 0.06 u[IU]/mL — ABNORMAL LOW (ref 0.350–5.000)

## 2018-12-18 LAB — C-REACTIVE PROTEIN(CRP),INFLAMMATION: CRP INFLAMMATION: 2.6 mg/L (ref <=8.0)

## 2018-12-18 LAB — SEDIMENTATION RATE: ERYTHROCYTE SEDIMENTATION RATE (ESR): 12 mm/h (ref 0–15)

## 2018-12-18 MED ORDER — ATORVASTATIN 40 MG TABLET
40.00 mg | ORAL_TABLET | Freq: Every evening | ORAL | 2 refills | Status: DC
Start: 2018-12-18 — End: 2019-04-26

## 2018-12-18 MED ORDER — FOLIC ACID 1 MG TABLET
1.0000 mg | ORAL_TABLET | Freq: Every day | ORAL | 3 refills | Status: AC
Start: 2018-12-18 — End: ?

## 2018-12-18 MED ORDER — SYRINGE WITH NEEDLE
INJECTION | 0 refills | Status: DC
Start: 2018-12-18 — End: 2019-05-02

## 2018-12-18 MED ORDER — HYDROXYCHLOROQUINE 200 MG TABLET
200.0000 mg | ORAL_TABLET | Freq: Every day | ORAL | 1 refills | Status: DC
Start: 2018-12-18 — End: 2018-12-20

## 2018-12-18 MED ORDER — METHOTREXATE SODIUM (PF) 25 MG/ML INJECTION SOLUTION
15.0000 mg | INTRAMUSCULAR | 2 refills | Status: DC
Start: 2018-12-23 — End: 2019-01-29

## 2018-12-18 NOTE — Progress Notes (Signed)
FAMILY MEDICINE, [place] [address]  Operated by [facility]  Video Visit     Name: [name]  MRN: [id number]    Date: 12/18/2018  Age: 63 y.o.                             Patient's location: [place] [address]   Patient/family aware of provider location: Yes  Patient/family consent for video visit: Yes  Interview and observation performed by: [name], MD    Chief Complaint: Multiple medical problems follow up    History of Present Illness:  [name] is a 63 y.o. male presents for follow up    Patient has hypertension. Patient is tolerating treatment regimen without side effects. Blood pressures at home are well controlled.     Patient with hyperlipidemia. This is well controlled on medication therapy. Denies side effects including myalgia, joint pain, headaches. Patient doing well without complaints related to lipids today.     Patient has diabetes mellitus. Sugars are currently controlled. no hypoglycemic symptoms. Patient tolerating treatment regimen well and is compliant. Complications include: ckd.       Past Medical History:  He has a past medical history of Arthritis, Back problem, Bruises easily, Cancer (CMS [place]), Diabetes (CMS [place]), Heartburn, HTN (hypertension), Hyperlipidemia, Hypothyroid, Kidney disease, MI (myocardial infarction) (CMS [place]), Neck problem, Rheumatoid arthritis (CMS [place]), Thyroid cancer (CMS [place]), Type 2 diabetes mellitus (CMS [place]), and Wears glasses. He also has no past medical history of Angina pectoris (CMS [place]), Asthma, Atrial fibrillation (CMS [place]), Chronic obstructive airway disease (CMS HCC), Cigarette smoker, Convulsions (CMS [facility]), CVA (cerebrovascular accident) (CMS [place]), Deep vein thrombosis (DVT) (CMS HCC), Disorder of liver, Dysrhythmias, Esophageal reflux, H/O urinary tract infection, Heart murmur, Hepatitis A virus infection, Kidney stones, Malignant hyperthermia, Nausea with vomiting,  Non-healing non-surgical wound, Oxygen dependent, Palpitations, Peripheral edema, Pneumonia, Problems with swallowing, Pseudocholinesterase deficiency, Pulmonary embolism (CMS HCC), Rheumatic fever, Shortness of breath, Sleep apnea, TIA (transient ischemic attack), Tuberculosis colliquativa, Upper respiratory infection, Viral hepatitis B, or Viral hepatitis C.    Past Surgical History:  He has a past surgical history that includes Shoulder surgery (Bilateral); Knee surgery (Bilateral); hx wrist fracture tx (Right); Total thyroidectomy; colonoscopy; and hx heart catheterization.    Problem List:  He has Primary osteoarthritis of right hip; Type 2 diabetes mellitus with renal complication (CMS [place]); Rheumatoid arthritis involving right hip (CMS HCC); Osteoarthritis of right hip; Pain of left hip joint; Primary osteoarthritis of left hip; CKD (chronic kidney disease) stage 3, GFR 30-59 ml/min (CMS HCC); Essential hypertension; Hypothyroidism; Rheumatoid arthritis (CMS HCC); and Thyroid nodule on their problem list.    Medications:  .  alpha lipoic acid  .  ascorbic acid  .  aspirin  .  atorvastatin  .  calcium carbonate/vitamin D3 (VITAMIN D-3 ORAL)  .  Ciclopirox  .  folic acid  .  hydrOXYchloroQUINE  .  levothyroxine  .  losartan  .  MetFORMIN  .  [START ON 12/23/2018] methotrexate (PF)  .  Syringe with Needle (Disp)  .  unknown medication     Review of Systems:  Constitutional: negative for fevers, chills and sweats  Respiratory: negative for cough, sputum or hemoptysis  Cardiovascular: negative for chest pain, chest pressure/discomfort and dyspnea  Gastrointestinal: negative for dysphagia, odynophagia and dyspepsia      Observational Exam:  Breathing is unlabored  Alert and oriented x 3  Skin without visible rashes  Mood and affect normal      Data Reviewed:   Labs from today    Assessment/Plan:  (I10) Essential hypertension  (primary encounter diagnosis)  Plan: controlled  Continue medication     (E78.5)  Hyperlipidemia, unspecified hyperlipidemia type  Plan: stable   Continue medication  Lab Results   Component Value Date    TRIG 103 06/13/2018    HDLCHOL 37 (L) 06/13/2018    LDLCHOL 50 06/13/2018    CHOLESTEROL 108 06/13/2018         (E11.9) Diabetes mellitus (CMS HCC)  Plan: well controlled  Continue medication  Pending a1c, last was 6.1%    Flu shot pending  Pneumonia shot pending    Has one stent; will continue losartan, Lipitor. Apparently there is controversy whether or not it was needed.       ICD-10-CM    1. Essential hypertension  I10    2. Hyperlipidemia, unspecified hyperlipidemia type  E78.5    3. Diabetes mellitus (CMS HCC)  E11.9      No orders of the defined types were placed in this encounter.    Follow Up:  6 months or sooner prn    [name], MD

## 2018-12-19 ENCOUNTER — Ambulatory Visit (HOSPITAL_BASED_OUTPATIENT_CLINIC_OR_DEPARTMENT_OTHER): Payer: PRIVATE HEALTH INSURANCE | Admitting: Family

## 2018-12-19 ENCOUNTER — Encounter (HOSPITAL_BASED_OUTPATIENT_CLINIC_OR_DEPARTMENT_OTHER): Payer: Self-pay | Admitting: Family

## 2018-12-19 ENCOUNTER — Ambulatory Visit
Admission: RE | Admit: 2018-12-19 | Discharge: 2018-12-19 | Disposition: A | Discharge status: Home / Self Care | Payer: PRIVATE HEALTH INSURANCE | Source: Ambulatory Visit | Attending: Family | Admitting: Family

## 2018-12-19 VITALS — BP 130/72 | Temp 96.6°F | Ht 72.0 in | Wt 282.0 lb

## 2018-12-19 DIAGNOSIS — I129 Hypertensive chronic kidney disease with stage 1 through stage 4 chronic kidney disease, or unspecified chronic kidney disease: Secondary | ICD-10-CM | POA: Insufficient documentation

## 2018-12-19 DIAGNOSIS — Z79899 Other long term (current) drug therapy: Secondary | ICD-10-CM | POA: Insufficient documentation

## 2018-12-19 DIAGNOSIS — R918 Other nonspecific abnormal finding of lung field: Secondary | ICD-10-CM

## 2018-12-19 DIAGNOSIS — E1122 Type 2 diabetes mellitus with diabetic chronic kidney disease: Secondary | ICD-10-CM

## 2018-12-19 DIAGNOSIS — M1990 Unspecified osteoarthritis, unspecified site: Secondary | ICD-10-CM | POA: Insufficient documentation

## 2018-12-19 DIAGNOSIS — N183 Chronic kidney disease, stage 3 unspecified (CMS HCC): Secondary | ICD-10-CM

## 2018-12-19 DIAGNOSIS — I10 Essential (primary) hypertension: Secondary | ICD-10-CM

## 2018-12-19 DIAGNOSIS — Z794 Long term (current) use of insulin: Secondary | ICD-10-CM | POA: Insufficient documentation

## 2018-12-19 DIAGNOSIS — E320 Persistent hyperplasia of thymus: Secondary | ICD-10-CM

## 2018-12-19 DIAGNOSIS — I2510 Atherosclerotic heart disease of native coronary artery without angina pectoris: Secondary | ICD-10-CM

## 2018-12-19 LAB — THYROGLOBULIN, TUMOR MARKER REFLEX TO LC-MS/MS OR IMMUNOASSAY: THYROGLOBULIN ANTIBODY, S: <1.8 IU/mL (ref <1.8)

## 2018-12-19 LAB — THYROGLOBULIN, TUMOR MARKER, IA, S REFLEX ONLY: THYROGLOBULIN, TUMOR MARKER, IA, S: 0.1 ng/mL — ABNORMAL HIGH

## 2018-12-19 NOTE — Progress Notes (Signed)
Identification: 63 y.o. Caucasian male being seen in f/u for CKD stage 3 with risk factors of HTN, diabetes, h/o NSAID use with arthritis, and IV contrast dye exposures.    Creatinines ranging 1.4-1.7 since at least 2016.      SUBJECTIVE DATA:  Patient states that he is doing well.  Continues to reside in [place].  BPs have been good and normally run 120/70s.  GFS readings are good. He is aware that his thyroid level from yesterday is still low and he is awaiting a call from his endocrinologist for instruction on what to do with his medication.  He does have some issue with arthritis in his feet and ankles, but avoids NSAIDs.  He is no longer on methotrexate.  REVIEW OF SYSTEMS:   Denies chest pain or dyspnea. No lower extremity edema.   Appetite "too good."  Denies N/V.  No fever or chills.  No dysuria or frank hematuria.  All other pertinent systems negative.     Current Outpatient Medications   Medication Sig   . alpha lipoic acid 600 mg Oral Tablet Take by mouth   . ascorbic acid (VITAMIN C) 1,000 mg Oral Tablet Take 1,000 mg by mouth Once a day   . aspirin (ECOTRIN) 81 mg Oral Tablet, Delayed Release (E.C.) Take 81 mg by mouth Once a day   . atorvastatin (LIPITOR) 40 mg Oral Tablet Take 1 Tab (40 mg total) by mouth Every evening   . calcium carbonate/vitamin D3 (VITAMIN D-3 ORAL) Take by mouth 1000 IU daily   . Ciclopirox 8 % Solution Apply 1 Applicatorful topically Every night (Patient not taking: Reported on 12/18/2018)   . folic acid (FOLVITE) 1 mg Oral Tablet Take 1 Tab (1 mg total) by mouth Once a day   . hydrOXYchloroQUINE (PLAQUENIL) 200 mg Oral Tablet Take 1 Tab (200 mg total) by mouth Once a day Two tablets once daily   . levothyroxine (SYNTHROID) 175 mcg Oral Tablet Take one tablet (174mcg total) by mouth every morning   . losartan (COZAAR) 50 mg Oral Tablet Take 1 Tab (50 mg total) by mouth Once a day In am   . MetFORMIN (GLUCOPHAGE) 1,000 mg Oral Tablet Take 0.5 Tabs (500 mg total) by mouth Twice  daily with food (Patient not taking: Reported on 09/13/2018)   . Syringe with Needle, Disp, Syringe Insulin syringe and needles of patients choice. Use as directed with insulin.1cc  27G X 1/2"   . UNKNOWN MEDICATION (UNKNOWN MEDICATION) 300 mg benfotiamine     No Known Allergies     OBJECTIVE DATA:  [name]  is a  63 y.o.  Caucasian male  who presents to clinic alone today.  He is ambulatory without difficulty and without the use of assistive devices.  He is in no acute distress and is pleasant and cooperative.      Vitals:    12/19/18 0917   BP: 130/72   Temp: 35.9 C (96.6 F)   Weight: 128 kg (281 lb 15.5 oz)   Height: 1.829 m (6')   BMI: 38.32        Examination  General: Appears stated age  Eyes: anicteric  HENT: Head normocephalic and atraumatic.  He is wearing a facial mask.   Neck: subtle, midline trachea  Lungs:  Clear to auscultation. No rales, crackles, or wheezes.  Displays good respiratory effort.  Cardiovascular: normal S1, S2.  RRR.  Abdomen: Soft, non-distended.  Extremities: No lower extremity edema  Skin: Warm, dry,  intact  Neurologic:No tremors  Psychiatric: Normal mood and affect.             Laboratory data:  Lab Results   Component Value Date    BUN 25 12/18/2018    BUN 21 08/31/2018    BUN 21 08/31/2018    CREATININE 1.66 (H) 12/18/2018    CREATININE 1.74 (H) 08/31/2018    CREATININE 1.74 (H) 08/31/2018    BUNCRRATIO 15 12/18/2018    BUNCRRATIO 15 06/13/2018    BUNCRRATIO 21 05/25/2018    GFR 43 (L) 12/18/2018    GFR 44 (L) 06/13/2018    GFR 40 05/25/2018    SODIUM 140 12/18/2018    SODIUM 145 (H) 08/31/2018    SODIUM 145 (H) 08/31/2018    POTASSIUM 4.3 12/18/2018    POTASSIUM 4.6 08/31/2018    POTASSIUM 4.6 08/31/2018    CHLORIDE 107 12/18/2018    CHLORIDE 106 08/31/2018    CHLORIDE 106 08/31/2018    CO2 23 12/18/2018    CO2 23 08/31/2018    CO2 23 08/31/2018    ANIONGAP 10 12/18/2018    ANIONGAP 10 06/13/2018    ANIONGAP 10 06/16/2017    CALCIUM 8.6 12/18/2018    CALCIUM 9.3 08/31/2018      CALCIUM 9.4 08/31/2018    PHOSPHORUS 3.2 08/31/2018    PHOSPHORUS 3.2 08/31/2018    PHOSPHORUS 3.1 05/25/2018    ALBUMIN 3.5 12/18/2018    ALBUMIN 4.3 05/25/2018    HGB 12.7 (L) 12/18/2018    HGB 13.8 08/31/2018    HGB 13.8 08/31/2018    HCT 37.6 (L) 12/18/2018    HCT 40.4 08/31/2018    HCT 40.4 08/31/2018    INTACTPTH 44 08/31/2018    HA1C 6.3 (H) 12/18/2018    HA1C 6.1 (H) 06/13/2018     Diagnosis and Plan  1. CKD stage 3 with risk factors of HTN, diabetes, h/o NSAID use with arthritis, and IV contrast dye exposures.    Creatinines ranging 1.4-1.7 since at least 2016.   Most recent creatinine level indicates stability at 1.66.    Urine testing without overt proteinuria.   I reminded him on the importance of avoiding nephrotoxic agents such as NSAIDs and IV contrast dye exposure.  I also discussed with him the importance of adequate hydration.  Understanding was verbalized.    2. Anemia.  No clinical evidence.  H/H well within target.  3. Bone mineral disease.  Calcium, phorphorus, vitamin d and iPTH good.   4. HTN.  BP well controlled.  Continue current meds.  5.  Diabetes.  A1c's have been below 7. Continue to follow with endocrine.  No overt proteinuria.  He is on an ARB and should continue for renal protection.    I will conduct a video visit with him in 3 months, per his request.         Rene Kocher. Eulas Post, [facility]   Nephrology Nurse Practitioner   [place] [facility]

## 2018-12-20 ENCOUNTER — Other Ambulatory Visit (HOSPITAL_BASED_OUTPATIENT_CLINIC_OR_DEPARTMENT_OTHER): Payer: Self-pay | Admitting: Rheumatology

## 2018-12-20 ENCOUNTER — Other Ambulatory Visit (HOSPITAL_BASED_OUTPATIENT_CLINIC_OR_DEPARTMENT_OTHER): Payer: Self-pay | Admitting: NURSE PRACTITIONER

## 2018-12-20 ENCOUNTER — Encounter (HOSPITAL_BASED_OUTPATIENT_CLINIC_OR_DEPARTMENT_OTHER): Payer: Self-pay | Admitting: NURSE PRACTITIONER

## 2018-12-20 DIAGNOSIS — C73 Malignant neoplasm of thyroid gland: Secondary | ICD-10-CM

## 2018-12-20 MED ORDER — HYDROXYCHLOROQUINE 200 MG TABLET
400.0000 mg | ORAL_TABLET | Freq: Every day | ORAL | 4 refills | Status: DC
Start: 2018-12-20 — End: 2019-07-27

## 2018-12-20 MED ORDER — LEVOTHYROXINE 150 MCG TABLET
150.00 ug | ORAL_TABLET | Freq: Every morning | ORAL | 3 refills | Status: DC
Start: 2018-12-20 — End: 2018-12-22

## 2018-12-20 NOTE — Telephone Encounter (Signed)
Regarding: verify rx  ----- Message from Benjamine Mola sent at 12/20/2018  3:15 PM EDT -----    Pharmacy called back in for clarification. Please call to advise.        ----- Message from Angelique Holm sent at 12/18/2018  3:31 PM EDT -----   Gerald Leitz, MD     Pharmacy needs to clarify rx, they have two set of directions.          hydrOXYchloroQUINE (PLAQUENIL) 200 mg Oral Tablet, Take 1 Tab (200 mg total) by mouth Once a day Two tablets once daily, Disp: 60 Tab, Rfl: 1

## 2018-12-20 NOTE — Progress Notes (Signed)
Hello,  Your neck ultrasound also looks good.  There is mention of lots of small lymph nodes but they are benign appearing and with a low TG result/marker, this is very reassuring.  Please repeat neck ultrasound in 6 months.  [name] K. Hardin Negus, APRN  Family Nurse Practitioner-C  Section of Endocrinology

## 2018-12-20 NOTE — Telephone Encounter (Signed)
corrected script

## 2018-12-20 NOTE — Progress Notes (Signed)
Hello,  Your TSH is still too low.  I would like for you to change to 150 mcg daily.  If you have a lot of 175 mcg tabs, you can take 175 6 days of the week (none on 7th day).  This is the same dosing as 150 micro daily. Please check TSH again in 5-6 weeks. Your TG marker is lower than 6 months ago this is excellent.  I will continue to monitor annually.  Your A1c remains stable at 6.3.  Goal is 6.5 so no change in medication and continue good low carb diet and exercise.  Murvin Gift K. Hardin Negus, APRN  Family Nurse Practitioner-C  Section of Endocrinology

## 2018-12-22 ENCOUNTER — Other Ambulatory Visit (HOSPITAL_BASED_OUTPATIENT_CLINIC_OR_DEPARTMENT_OTHER): Payer: Self-pay | Admitting: NURSE PRACTITIONER

## 2018-12-22 MED ORDER — LEVOTHYROXINE 150 MCG TABLET
150.00 ug | ORAL_TABLET | Freq: Every morning | ORAL | 0 refills | Status: AC
Start: 2018-12-22 — End: ?

## 2018-12-22 NOTE — Telephone Encounter (Signed)
Current Outpatient Medications   Medication Sig   . alpha lipoic acid 600 mg Oral Tablet Take by mouth   . ascorbic acid (VITAMIN C) 1,000 mg Oral Tablet Take 1,000 mg by mouth Once a day   . aspirin (ECOTRIN) 81 mg Oral Tablet, Delayed Release (E.C.) Take 81 mg by mouth Once a day   . atorvastatin (LIPITOR) 40 mg Oral Tablet Take 1 Tab (40 mg total) by mouth Every evening   . calcium carbonate/vitamin D3 (VITAMIN D-3 ORAL) Take by mouth 1000 IU daily   . Ciclopirox 8 % Solution Apply 1 Applicatorful topically Every night (Patient not taking: Reported on 12/18/2018)   . folic acid (FOLVITE) 1 mg Oral Tablet Take 1 Tab (1 mg total) by mouth Once a day   . hydrOXYchloroQUINE (PLAQUENIL) 200 mg Oral Tablet Take 2 Tabs (400 mg total) by mouth Once a day   . levothyroxine (SYNTHROID) 150 mcg Oral Tablet Take 1 Tab (150 mcg total) by mouth Every morning Take one tablet (134mcg total) by mouth every morning   . losartan (COZAAR) 50 mg Oral Tablet Take 1 Tab (50 mg total) by mouth Once a day In am   . MetFORMIN (GLUCOPHAGE) 1,000 mg Oral Tablet Take 0.5 Tabs (500 mg total) by mouth Twice daily with food (Patient not taking: Reported on 09/13/2018)   . [START ON 12/23/2018] methotrexate, PF, 25 mg/mL Injection 0.6 mL (15 mg total) by Subcutaneous route Every 7 days   . UNKNOWN MEDICATION (UNKNOWN MEDICATION) 300 mg benfotiamine     Last dept visit:  06/14/2018  Next pending dept visit: 03/06/2019  Routing corrected levothyroxine rx to provider.  Perris Conwell, RN 12/22/2018, 08:51

## 2018-12-25 ENCOUNTER — Ambulatory Visit (HOSPITAL_BASED_OUTPATIENT_CLINIC_OR_DEPARTMENT_OTHER): Payer: Self-pay | Admitting: Family Medicine

## 2018-12-25 DIAGNOSIS — D4989 Neoplasm of unspecified behavior of other specified sites: Secondary | ICD-10-CM

## 2018-12-25 DIAGNOSIS — R599 Enlarged lymph nodes, unspecified: Secondary | ICD-10-CM

## 2018-12-25 NOTE — Telephone Encounter (Signed)
Called and spoke with patient. Notified of CT chest results per Dr. Tama Gander result note dated 09/15. Patient states that he is agreeable to MRI or PET which ever Dr. Tama Gander wants to order. Please order testing for authorization.   Patient  And wife verbalized understanding to results and denies questions at this time.   Wayland Salinas, RN

## 2018-12-25 NOTE — Telephone Encounter (Signed)
Regarding: [place] pt/ request  ----- Message from De Hollingshead sent at 12/22/2018  4:41 PM EDT -----  [name], MD    Pt requesting a call back . Stated Raquel Sarna left him a message to call back about CT results.    Thank you    Andee Poles

## 2018-12-26 ENCOUNTER — Encounter (HOSPITAL_BASED_OUTPATIENT_CLINIC_OR_DEPARTMENT_OTHER): Payer: Self-pay | Admitting: Family Medicine

## 2018-12-30 ENCOUNTER — Encounter (HOSPITAL_BASED_OUTPATIENT_CLINIC_OR_DEPARTMENT_OTHER): Payer: Self-pay | Admitting: Family Medicine

## 2018-12-30 ENCOUNTER — Encounter (HOSPITAL_BASED_OUTPATIENT_CLINIC_OR_DEPARTMENT_OTHER): Payer: Self-pay | Admitting: NURSE PRACTITIONER

## 2019-01-01 ENCOUNTER — Telehealth (HOSPITAL_BASED_OUTPATIENT_CLINIC_OR_DEPARTMENT_OTHER): Payer: Self-pay | Admitting: Family Medicine

## 2019-01-01 NOTE — Telephone Encounter (Signed)
-----   Message from [facility] sent at 01/01/2019 10:39 AM EDT -----  Regarding: FW: MRI    ----- Message -----  From: Sullivan Lone  Sent: 01/01/2019  10:27 AM EDT  To: [name], MD  Subject: MRI                                              Hello    This patient is scheduled for an MRI on 01/16/19. The patient is asking to have medication called in to help him get through the scan.    Thanks

## 2019-01-02 ENCOUNTER — Other Ambulatory Visit (HOSPITAL_BASED_OUTPATIENT_CLINIC_OR_DEPARTMENT_OTHER): Payer: Self-pay | Admitting: Family Medicine

## 2019-01-02 ENCOUNTER — Telehealth (HOSPITAL_BASED_OUTPATIENT_CLINIC_OR_DEPARTMENT_OTHER): Payer: Self-pay | Admitting: NURSE PRACTITIONER

## 2019-01-02 NOTE — Telephone Encounter (Signed)
Received Eye Exam. Placed in Provider's box to review then will scan into chart.  Merleen Milliner, [place]  01/02/2019, 13:44

## 2019-01-04 NOTE — Telephone Encounter (Signed)
[name], MD  You Yesterday (9:32 AM)      I already got the message, asked the nurse to please see what pharmacy to use because it is noted to be floor to pharmacy and thought he would be in town. Please check to see what in town pharmacy he needs before since coming in.   [name], MD 01/03/2019, 09:32      Attempted to reach patient. No answer. VM left asking for return call to clinic/mychart message. Wayland Salinas, RN

## 2019-01-05 ENCOUNTER — Encounter (HOSPITAL_BASED_OUTPATIENT_CLINIC_OR_DEPARTMENT_OTHER): Payer: Self-pay | Admitting: NURSE PRACTITIONER

## 2019-01-05 NOTE — Progress Notes (Signed)
Requesting Physician: [name], MD  History of Present Illness  [name] is a 63 y.o. male who presents with a chief complaint of Rheumatoid Arthritis   to clinic.  Patient has been followed at [facility].  Has [employer].  He lives in [place] but he comes up here for his healthcare because of that.  He has been living in [place] for about a year.  He states he was diagnosed with RA about 10 years ago.  He states they have discussed adding another medicine but he has declined.  He says a PET scan has shown nodules in his lung.  [facility] a [facility].  Has a couple of hours of stiffness in his feet.  Labs about 3 months ago.  Gets eye exams and the next 1 is scheduled.  Notes some burning discomfort on the right great toe.  Had been seeing Dr. Doyne Keel at [facility].  Humira did not help.  Then on prednisone for about 2 years.  We do not have any records to review in detail today.  He does have some dyspnea on exertion.  A refill of his methotrexate and his syringes.  Apparently Dr. Doyne Keel was giving him atorvastatin.  Past History  Current Outpatient Medications   Medication Sig   . alpha lipoic acid 600 mg Oral Tablet Take by mouth   . ascorbic acid (VITAMIN C) 1,000 mg Oral Tablet Take 1,000 mg by mouth Once a day   . aspirin (ECOTRIN) 81 mg Oral Tablet, Delayed Release (E.C.) Take 81 mg by mouth Once a day   . atorvastatin (LIPITOR) 40 mg Oral Tablet Take 1 Tab (40 mg total) by mouth Every evening   . calcium carbonate/vitamin D3 (VITAMIN D-3 ORAL) Take by mouth 1000 IU daily   . Ciclopirox 8 % Solution Apply 1 Applicatorful topically Every night (Patient not taking: Reported on 12/18/2018)   . folic acid (FOLVITE) 1 mg Oral Tablet Take 1 Tab (1 mg total) by mouth Once a day   . hydrOXYchloroQUINE (PLAQUENIL) 200 mg Oral Tablet Take 2 Tabs (400 mg total) by mouth Once a day   . levothyroxine (SYNTHROID) 150 mcg Oral Tablet  Take 1 Tab (150 mcg total) by mouth Every morning   . losartan (COZAAR) 50 mg Oral Tablet Take 1 Tab (50 mg total) by mouth Once a day In am   . MetFORMIN (GLUCOPHAGE) 1,000 mg Oral Tablet Take 0.5 Tabs (500 mg total) by mouth Twice daily with food (Patient not taking: Reported on 09/13/2018)   . methotrexate, PF, 25 mg/mL Injection 0.6 mL (15 mg total) by Subcutaneous route Every 7 days   . UNKNOWN MEDICATION (UNKNOWN MEDICATION) 300 mg benfotiamine     No Known Allergies  Past Medical History:   Diagnosis Date   . Arthritis    . Back problem    . Bruises easily    . Cancer (CMS [place])    . Diabetes (CMS [place])    . Heartburn    . HTN (hypertension)    . Hyperlipidemia    . Hypothyroid    . Kidney disease    . MI (myocardial infarction) (CMS [place])    . Neck problem    . Rheumatoid arthritis (CMS [place])    . Thyroid cancer (CMS [place])    . Type 2 diabetes mellitus (CMS HCC)    . Wears glasses  Past Surgical History:   Procedure Laterality Date   . COLONOSCOPY     . HX HEART CATHETERIZATION      1 cardiac stent   . HX WRIST FRACTURE TX Right     plate and removal   . KNEE SURGERY Bilateral     bil knees   . SHOULDER SURGERY Bilateral     scopes with rotator cuff both    . TOTAL THYROIDECTOMY           Family History  Family Medical History:     Problem Relation (Age of Onset)    No Known Problems Mother, Father        His mother, maternal great uncle have arthritis.    Social History  Social History     Socioeconomic History   . Marital status: Married     Spouse name: Not on file   . Number of children: Not on file   . Years of education: Not on file   . Highest education level: Not on file   Tobacco Use   . Smoking status: Never Smoker   . Smokeless tobacco: Never Used   Substance and Sexual Activity   . Alcohol use: Yes     Comment: 4 times a year   . Drug use: Never   Other Topics Concern   . Ability to Walk 1 Flight of Steps without SOB/CP Yes   . Ability To Do Own ADL's Yes     Review of Systems  Other than ROS  in the HPI, all other systems were negative.  Examination  Vitals: BP 122/80   Temp 35.9 C (96.6 F)   Ht 1.829 m (6')   Wt 127 kg (280 lb 6.8 oz)   BMI 38.03 kg/m     General: appears in good health  Eyes: Conjunctiva clear.  HENT:Mouth without ulceration.  Neck: no adenopathy  Cardiovascular: RRR   Lungs: clear to auscultation bilaterally.   Abdomen: non-tender  Extremities: no cyanosis   Joints:  No active synovitis.  Skin:  No visible rash on exposed areas of skin.  Neurologic: Grossly intact.  Psychiatric: AOx3  Diagnosis and Plan  1. 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 Rheumatoid arthritis, involving unspecified site, unspecified rheumatoid factor presence (CMS [place])    Patient with a prior diagnosis of RA previously followed at [facility].  I reviewed his medications that he brought with him a list today.  I reviewed records from [facility].  As summarized those chest CT with contrast showed multiple solid tumors less than 6 mm in size.  This was done on 11/08/2016.  CT chest on 05/27/2017 borderline enlarged anterior mediastinal lymph nodes stable since 11/08/2016, multiple small right lung nodule stable since 11/08/2016 no new pulmonary findings.  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  Recommend serial follow-up.  Subcentimeter noncalcified pulmonary nodules below the resolution of PET scan.  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  MRI of the hip dated 02/27/2017 degenerative change, labral tear, partial intramuscular tear at rectus femoris.      Patient with prior diagnosis of RA.  Needs labs.  Will refill his medication.  I will refill his Lipitor for now but he needs to discuss this further with his family doctor.  Will try to get records from [facility] to review by [facility]  if possible.   Return 6 months sooner if needed.  Otherwise no change in therapy at this time.  Orders Placed This Encounter   . Cbc/Diff   . Comprehensive Metabolic Panel, Nf   . Sedimentation Rate   . C-REACTIVE PROTEIN(CRP),INFLAMMATION   . folic acid (FOLVITE) 1 mg Oral Tablet   . atorvastatin (LIPITOR) 40 mg Oral Tablet   . methotrexate, PF, 25 mg/mL Injection   . Syringe with Needle, Disp, Syringe     Gerald Leitz, MD

## 2019-01-09 MED ORDER — DIAZEPAM 5 MG TABLET
5.00 mg | ORAL_TABLET | Freq: Once | ORAL | 0 refills | Status: DC | PRN
Start: 2019-01-09 — End: 2019-04-26

## 2019-01-16 ENCOUNTER — Ambulatory Visit (HOSPITAL_BASED_OUTPATIENT_CLINIC_OR_DEPARTMENT_OTHER): Payer: PRIVATE HEALTH INSURANCE | Admitting: Family

## 2019-01-16 ENCOUNTER — Ambulatory Visit
Admission: RE | Admit: 2019-01-16 | Discharge: 2019-01-16 | Disposition: A | Discharge status: Home / Self Care | Payer: PRIVATE HEALTH INSURANCE | Source: Ambulatory Visit | Attending: Dermatology | Admitting: Dermatology

## 2019-01-16 ENCOUNTER — Ambulatory Visit (HOSPITAL_BASED_OUTPATIENT_CLINIC_OR_DEPARTMENT_OTHER): Payer: Self-pay | Admitting: GENERAL PRACTICE

## 2019-01-16 ENCOUNTER — Encounter (HOSPITAL_BASED_OUTPATIENT_CLINIC_OR_DEPARTMENT_OTHER): Payer: Self-pay | Admitting: Family

## 2019-01-16 ENCOUNTER — Other Ambulatory Visit: Payer: Self-pay

## 2019-01-16 VITALS — Temp 97.0°F | Ht 72.36 in | Wt 261.9 lb

## 2019-01-16 DIAGNOSIS — L989 Disorder of the skin and subcutaneous tissue, unspecified: Secondary | ICD-10-CM | POA: Insufficient documentation

## 2019-01-16 DIAGNOSIS — R590 Localized enlarged lymph nodes: Secondary | ICD-10-CM

## 2019-01-16 DIAGNOSIS — R599 Enlarged lymph nodes, unspecified: Secondary | ICD-10-CM

## 2019-01-16 DIAGNOSIS — R918 Other nonspecific abnormal finding of lung field: Secondary | ICD-10-CM

## 2019-01-16 DIAGNOSIS — D4989 Neoplasm of unspecified behavior of other specified sites: Secondary | ICD-10-CM

## 2019-01-16 MED ORDER — MUPIROCIN CALCIUM 2 % NASAL OINTMENT
TOPICAL_OINTMENT | Freq: Two times a day (BID) | NASAL | 0 refills | Status: AC
Start: 2019-01-16 — End: ?

## 2019-01-16 NOTE — Patient Instructions (Addendum)
The patient was educated on the importance of avoiding excessive sun exposure and wearing sunscreen daily SPF 30 or higher.  Advised patient to re-apply sunscreen every 2-3 hours.  Advised the patient to avoid going to and using tanning beds.      AVOID PICKING LESIONS     Dilute Bleach Baths       How do you use dilute bleach baths?   - Add  cup of 6% household bleach (like 6% regular Clorox bleach) to a full bathtub of lukewarm water (which is about 40 gallons), and mix.  If your child doesn't take full baths, or you have a small bathtub, you would put in less bleach.  For example,  cup of bleach is used for a half full bathtub.  Another way to measure is to use 1 to 2 teaspoons of bleach per gallon of water in the bathtub.    - Never put bleach straight from the bottle (undiluted) on the skin.  - You should use lukewarm water because hot bath water may make eczema worse.    - Soak for 10 to 20 minutes.  - After bathing, you can rinse off with regular water   - Pat the skin dry with a soft towel after the bath.  Apply your skin medications and moisturizer within 3 minutes of drying off.      Lather chlorhexidine on a loofah

## 2019-01-16 NOTE — Progress Notes (Addendum)
Dermatology Clinic, [facility]  [place] [address]  [id number]    Date:   01/16/2019  Name: [name]  Age: 63 y.o.      Chief complaint: Skin Check    HPI  [name] is a 63 y.o. male presenting as a new patient for skin check. Patient states he has gotten "sores" on his body for several years. States they are mostly on his legs, arms and within the last 5 months he has noticed then on his posterior neck. Reports excessively picking at lesions. State several years ago he was given Mupirocin from an outside provider and lesions improved. He otherwise denies any new, changing, bleeding, or rapidly growing lesions.  No other skin-related complaints. No personal or family history of skin cancer.       Review of Systems   Constitutional: Negative for chills and fever.   Skin: Negative for itching and rash.      Current Medications  Outpatient Encounter Medications as of 01/16/2019   Medication Sig Dispense Refill   . alpha lipoic acid 600 mg Oral Tablet Take by mouth     . ascorbic acid (VITAMIN C) 1,000 mg Oral Tablet Take 1,000 mg by mouth Once a day     . aspirin (ECOTRIN) 81 mg Oral Tablet, Delayed Release (E.C.) Take 81 mg by mouth Once a day     . atorvastatin (LIPITOR) 40 mg Oral Tablet Take 1 Tab (40 mg total) by mouth Every evening 30 Tab 2   . calcium carbonate/vitamin D3 (VITAMIN D-3 ORAL) Take by mouth 1000 IU daily     . Ciclopirox 8 % Solution Apply 1 Applicatorful topically Every night (Patient not taking: Reported on 12/18/2018) 1 Bottle 2   . diazePAM (VALIUM) 5 mg Oral Tablet Take 1 Tab (5 mg total) by mouth Once, as needed for Anxiety (take 1-2 prior to mri prn) for up to 1 dose (Patient not taking: Reported on 0000000) 2 Tab 0   . folic acid (FOLVITE) 1 mg Oral Tablet Take 1 Tab (1 mg total) by mouth Once a day 90 Tab 3   . hydrOXYchloroQUINE (PLAQUENIL) 200 mg Oral Tablet Take 2 Tabs (400 mg total) by mouth Once a day 60 Tab 4   .  levothyroxine (SYNTHROID) 150 mcg Oral Tablet Take 1 Tab (150 mcg total) by mouth Every morning 90 Tab 0   . losartan (COZAAR) 50 mg Oral Tablet Take 1 Tab (50 mg total) by mouth Once a day In am (Patient not taking: Reported on 01/16/2019) 90 Tab 3   . MetFORMIN (GLUCOPHAGE) 1,000 mg Oral Tablet Take 0.5 Tabs (500 mg total) by mouth Twice daily with food (Patient not taking: Reported on 09/13/2018) 180 Tab 1   . methotrexate, PF, 25 mg/mL Injection 0.6 mL (15 mg total) by Subcutaneous route Every 7 days 2 mL 2   . UNKNOWN MEDICATION (UNKNOWN MEDICATION) 300 mg benfotiamine       No facility-administered encounter medications on file as of 01/16/2019.       No Known Allergies     Past Medical History:   Diagnosis Date   . Arthritis    . Back problem    . Bruises easily    . Cancer (CMS [place])    . Diabetes (CMS [place])    . Heartburn    . HTN (hypertension)    . Hyperlipidemia    . Hypothyroid    . Kidney disease    .  MI (myocardial infarction) (CMS [place])    . Neck problem    . Rheumatoid arthritis (CMS [place])    . Thyroid cancer (CMS [place])    . Type 2 diabetes mellitus (CMS HCC)    . Wears glasses      Physical Exam  Vitals:    Temp:  [36.1 C (97 F)] 36.1 C (97 F) (10/13 [id number])   Vitals:    01/16/19 0823   Temp: 36.1 C (97 F)   TempSrc: Temporal   Weight: 119 kg (261 lb 14.5 oz)   Height: 1.838 m (6' 0.36")        Physical Exam   Constitutional: He is oriented to person, place, and time and well-developed, well-nourished, and in no distress. No distress.   HENT:   Head:       Neurological: He is alert and oriented to person, place, and time. Coordination normal.   Skin: Skin is warm and dry. No pallor.        Psychiatric: Affect normal.      General skin exam was performed including head, neck, anterior/posterior trunk, bilateral upper and lower extremities and revealed no areas of concern other than those documented.     Assessment and Plan  Problem List Items Addressed This Visit     None        1. 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  -Discussed with patient that Staph is a normal flora on skin, but picking lesion can worsen overgrowth  -Instructed patient to take bleach baths twice weekly  -Start Mupirocin 2% twice daily. Prescription given      The patient was educated on the importance of avoiding excessive sun exposure and wearing sunscreen daily.  Advised patient to re-apply sunscreen every 2-3 hours.  Advised the patient to avoid going to the tanning beds.  Advised to check skin routinely for any changes, especially any new moles or changes in existing moles.    RTC in 12 months, or sooner if needed     This is a shared visit with Dr. Odella Aquas.      I am scribing for, and in the presence of, Monique Bandy, APRN,FNP-BC for services provided on 01/16/2019.  Marlyce Huge, SCRIBE       I personally performed the services described in this documentation, as scribed  in my presence, and it is both accurate  and complete.    Monique Bandy, APRN,FNP-BC    I personally saw and evaluated the patient. See mid-level's note for additional details. My findings/participation are in favor of auto-excoriations, with possible component of folliculitis.  Agree with topical mupirocin to affected areas.     Ricki Miller, MD

## 2019-01-23 ENCOUNTER — Encounter (HOSPITAL_BASED_OUTPATIENT_CLINIC_OR_DEPARTMENT_OTHER): Payer: Self-pay | Admitting: Family Medicine

## 2019-01-27 ENCOUNTER — Encounter (HOSPITAL_BASED_OUTPATIENT_CLINIC_OR_DEPARTMENT_OTHER): Payer: Self-pay | Admitting: Rheumatology

## 2019-01-29 MED ORDER — METHOTREXATE SODIUM (PF) 25 MG/ML INJECTION SOLUTION
15.0000 mg | INTRAMUSCULAR | 5 refills | Status: DC
Start: 2019-01-29 — End: 2019-05-02

## 2019-01-29 NOTE — Telephone Encounter (Signed)
From: Orpah Cobb  To: Gerald Leitz, MD  Sent: 01/27/2019 8:20 AM EDT  Subject: Prescription Question     I need a refill on my Methotrexat. I'm taking my last shot today. Thanks  [employer] in [place], [place]   Thanks

## 2019-02-01 ENCOUNTER — Encounter (HOSPITAL_BASED_OUTPATIENT_CLINIC_OR_DEPARTMENT_OTHER): Payer: Self-pay | Admitting: Rheumatology

## 2019-02-16 ENCOUNTER — Encounter (HOSPITAL_BASED_OUTPATIENT_CLINIC_OR_DEPARTMENT_OTHER): Payer: Self-pay | Admitting: Family

## 2019-02-18 ENCOUNTER — Encounter (INDEPENDENT_AMBULATORY_CARE_PROVIDER_SITE_OTHER): Payer: Self-pay | Admitting: Podiatrist

## 2019-02-18 ENCOUNTER — Encounter (HOSPITAL_BASED_OUTPATIENT_CLINIC_OR_DEPARTMENT_OTHER): Payer: Self-pay | Admitting: Family

## 2019-02-24 ENCOUNTER — Encounter (HOSPITAL_BASED_OUTPATIENT_CLINIC_OR_DEPARTMENT_OTHER): Payer: Self-pay | Admitting: NURSE PRACTITIONER

## 2019-03-05 ENCOUNTER — Encounter (HOSPITAL_BASED_OUTPATIENT_CLINIC_OR_DEPARTMENT_OTHER): Payer: Self-pay | Admitting: Family

## 2019-03-06 ENCOUNTER — Encounter (HOSPITAL_BASED_OUTPATIENT_CLINIC_OR_DEPARTMENT_OTHER): Payer: Self-pay | Admitting: NURSE PRACTITIONER

## 2019-03-07 ENCOUNTER — Encounter (HOSPITAL_BASED_OUTPATIENT_CLINIC_OR_DEPARTMENT_OTHER): Payer: Self-pay | Admitting: NURSE PRACTITIONER

## 2019-03-13 ENCOUNTER — Telehealth (HOSPITAL_BASED_OUTPATIENT_CLINIC_OR_DEPARTMENT_OTHER): Payer: Self-pay | Admitting: Rheumatology

## 2019-03-13 LAB — ENTER/EDIT EXTERNAL COMMON LAB RESULTS: PROSTATE SPECIFIC AG: 1.7

## 2019-03-13 NOTE — Telephone Encounter (Signed)
Lab results from [facility] collected 03/12/2019 entered and placed in provider folder for review. Dawson Bills, MA  03/13/2019, 14:54

## 2019-03-15 ENCOUNTER — Ambulatory Visit: Payer: PRIVATE HEALTH INSURANCE | Attending: Rheumatology | Admitting: Rheumatology

## 2019-03-15 ENCOUNTER — Encounter (HOSPITAL_BASED_OUTPATIENT_CLINIC_OR_DEPARTMENT_OTHER): Payer: Self-pay | Admitting: NURSE PRACTITIONER

## 2019-03-15 ENCOUNTER — Other Ambulatory Visit: Payer: Self-pay

## 2019-03-15 DIAGNOSIS — Z79899 Other long term (current) drug therapy: Secondary | ICD-10-CM | POA: Insufficient documentation

## 2019-03-15 DIAGNOSIS — M05751 Rheumatoid arthritis with rheumatoid factor of right hip without organ or systems involvement: Secondary | ICD-10-CM | POA: Insufficient documentation

## 2019-03-15 NOTE — Progress Notes (Signed)
[place], [place]  [place] [address]  Operated by [place]  Video Visit     Name: [name]  MRN: [id number]    Date: 03/15/2019  Age: 63 y.o.                            Patient's location: [place] [address]   Patient/family aware of provider location: Yes  Patient/family consent for video visit: Yes  Interview and observation performed by: Gerald Leitz, MD    Chief Complaint: Follow-up    History of Present Illness:  [name] is a 63 y.o. male patient with rheumatoid arthritis seen as a new patient in September.  Has been treated with methotrexate.  Denies oral ulcers or GI distress.  Morning stiffness lasts 30-60 minutes.  Did have a flare-up a couple of weeks ago in his hips and knees.  He does tell me is to get rid joint replacements.  Flu shot was already done.  He does have pulmonary follow-up and a history of thyroid cancer for which he also has follow-up.  He denies any joint pain or swelling right now.  He continues on methotrexate and Plaquenil.  Past Medical History:  He has a past medical history of Arthritis, Back problem, Bruises easily, Cancer (CMS [place]), Diabetes (CMS [place]), Heartburn, HTN (hypertension), Hyperlipidemia, Hypothyroid, Kidney disease, MI (myocardial infarction) (CMS [place]), Neck problem, Rheumatoid arthritis (CMS [place]), Thyroid cancer (CMS [place]), Type 2 diabetes mellitus (CMS [place]), and Wears glasses. He also has no past medical history of Angina pectoris (CMS HCC), Asthma, Atrial fibrillation (CMS HCC), Chronic obstructive airway disease (CMS HCC), Cigarette smoker, Convulsions (CMS HCC), CVA (cerebrovascular accident) (CMS [place]), Deep vein thrombosis (DVT) (CMS HCC), Disorder of liver, Dysrhythmias, Esophageal reflux, H/O urinary tract infection, Heart murmur, Hepatitis A virus infection, Kidney stones, Malignant hyperthermia, Nausea with vomiting, Non-healing non-surgical wound, Oxygen dependent,  Palpitations, Peripheral edema, Pneumonia, Problems with swallowing, Pseudocholinesterase deficiency, Pulmonary embolism (CMS HCC), Rheumatic fever, Shortness of breath, Sleep apnea, TIA (transient ischemic attack), Tuberculosis colliquativa, Upper respiratory infection, Viral hepatitis B, or Viral hepatitis C.    Past Surgical History:  He has a past medical history of Arthritis, Back problem, Bruises easily, Cancer (CMS [place]), Diabetes (CMS [place]), Heartburn, HTN (hypertension), Hyperlipidemia, Hypothyroid, Kidney disease, MI (myocardial infarction) (CMS [place]), Neck problem, Rheumatoid arthritis (CMS [place]), Thyroid cancer (CMS [place]), Type 2 diabetes mellitus (CMS [place]), and Wears glasses.    Problem List:  He has Primary osteoarthritis of right hip; Type 2 diabetes mellitus with renal complication (CMS HCC); Rheumatoid arthritis involving right hip (CMS HCC); Osteoarthritis of right hip; Pain of left hip joint; Primary osteoarthritis of left hip; CKD (chronic kidney disease) stage 3, GFR 30-59 ml/min; Essential hypertension; Hypothyroidism; Rheumatoid arthritis (CMS HCC); and Thyroid nodule on their problem list.    Medications:  .  alpha lipoic acid  .  ascorbic acid  .  aspirin  .  atorvastatin  .  calcium carbonate/vitamin D3 (VITAMIN D-3 ORAL)  .  Cholecalciferol (Vitamin D3)  .  Ciclopirox  .  coenzyme Q10  .  diazePAM  .  folic acid  .  hydrOXYchloroQUINE  .  levothyroxine  .  losartan  .  MetFORMIN  .  methotrexate (PF)  .  mupirocin  .  unknown medication  .  K2 Plus D3     Observational Exam:  No frank synovitis of the upper extremities.  He appears in no acute distress.  No facial rash.    Data Reviewed:   Telephone on 03/13/2019   Component Date Value Ref Range Status   . PROSTATE SPECIFIC AG 03/12/2019 1.7   Final   Appointment on 12/18/2018   Component Date Value Ref Range Status   . HEMOGLOBIN A1C 12/18/2018 6.3* 4.0 - 5.6 % Final   . ESTIMATED AVERAGE GLUCOSE 12/18/2018 134  mg/dL Final   . THYROGLOBULIN  ANTIBODY, S 12/18/2018 <1.8  <1.8 IU/mL Final    Thyroglobulin Antibody < 1.8 IU/mL. Thyroglobulin  performed by Immunoassay to follow.     Test Performed by:  [facility]  [id number], [place] [address]  Lab Director: Curt Jews M.D. Ph.D.; CLIA# [id number]   . TSH 12/18/2018 0.060* 0.350 - 5.000 uIU/mL Final   . CRP INFLAMMATION 12/18/2018 2.6  <=8.0 mg/L Final   . SODIUM 12/18/2018 140  136 - 145 mmol/L Final   . POTASSIUM 12/18/2018 4.3  3.5 - 5.1 mmol/L Final   . CHLORIDE 12/18/2018 107  96 - 111 mmol/L Final   . CO2 TOTAL 12/18/2018 23  22 - 32 mmol/L Final   . ANION GAP 12/18/2018 10  4 - 13 mmol/L Final   . BUN 12/18/2018 25  8 - 25 mg/dL Final   . CREATININE 12/18/2018 1.66* 0.62 - 1.27 mg/dL Final   . BUN/CREA RATIO 12/18/2018 15  6 - 22 Final   . ESTIMATED GFR 12/18/2018 43* >60 mL/min/1.95m^2 Final   . ALBUMIN 12/18/2018 3.5  3.4 - 4.8 g/dL Final   . CALCIUM 12/18/2018 8.6  8.5 - 10.2 mg/dL Final   . GLUCOSE 12/18/2018 104  65 - 139 mg/dL Final   . ALKALINE PHOSPHATASE 12/18/2018 81  45 - 115 U/L Final   . ALT (SGPT) 12/18/2018 16  <55 U/L Final   . AST (SGOT) 12/18/2018 13  8 - 48 U/L Final   . BILIRUBIN TOTAL 12/18/2018 0.3  0.3 - 1.3 mg/dL Final    Naproxen treatment can falsely elevate total bilirubin levels.   Marland Kitchen PROTEIN TOTAL 12/18/2018 6.5  6.0 - 8.0 g/dL Final   . ERYTHROCYTE SEDIMENTATION RATE (ES* 12/18/2018 12  0 - 15 mm/hr Final   . WBC 12/18/2018 5.0  3.7 - 11.0 x10^3/uL Final   . RBC 12/18/2018 3.95* 4.50 - 6.10 x10^6/uL Final   . HGB 12/18/2018 12.7* 13.4 - 17.5 g/dL Final   . HCT 12/18/2018 37.6* 38.9 - 52.0 % Final   . MCV 12/18/2018 95.2  78.0 - 100.0 fL Final   . MCH 12/18/2018 32.2* 26.0 - 32.0 pg Final   . MCHC 12/18/2018 33.8  31.0 - 35.5 g/dL Final   . RDW-CV 12/18/2018 12.7  11.5 - 15.5 % Final   . PLATELETS 12/18/2018 167  150 - 400 x10^3/uL Final   . MPV 12/18/2018 11.2  8.7 - 12.5 fL Final   . NEUTROPHIL % 12/18/2018 73   % Final   . LYMPHOCYTE % 12/18/2018 13  % Final   . MONOCYTE % 12/18/2018 8  % Final   . EOSINOPHIL % 12/18/2018 5  % Final   . BASOPHIL % 12/18/2018 1  % Final   . NEUTROPHIL # 12/18/2018 3.62  1.50 - 7.70 x10^3/uL Final   . LYMPHOCYTE # 12/18/2018 0.67* 1.00 - 4.80 x10^3/uL Final   . MONOCYTE # 12/18/2018 0.39  0.20 -  1.10 x10^3/uL Final   . EOSINOPHIL # 12/18/2018 0.25  <=0.50 x10^3/uL Final   . BASOPHIL # 12/18/2018 <0.10  <=0.20 x10^3/uL Final   . IMMATURE GRANULOCYTE % 12/18/2018 0  0 - 1 % Final    The immature granulocyte fraction (IGF) quantifies total circulating myelocytes, metamyelocytes, and promyelocytes. It is used to evaluate immune responses to infection, inflammation, or other stimuli of the bone marrow. Caution is advised in interpreting test results in neonates who normally have greater numbers of circulating immature blood cells.     . IMMATURE GRANULOCYTE # 12/18/2018 <0.10  <0.10 x10^3/uL Final   . THYROGLOBULIN, TUMOR MARKER, IA, S 12/18/2018 0.1* ng/mL Final       -------------------REFERENCE VALUE--------------------------  Athyrotic <0.1   Intact Thyroid <=33      . THYROGLOBULIN INTERPRETATION 12/18/2018 SEE COMMENTS   Final    Comment: Thyroglobulin (Tg) levels must be interpreted in the context  of TSH levels, serial Tg measurements and radioiodine   ablation status.  Tg levels of 0.1-2.0 ng/mL in athyrotic  individuals on suppressive therapy indicate a low risk of   clinically detectable recurrent papillary/follicular thyroid  cancer.         -------------------ADDITIONAL INFORMATION-------------------  PLEASE NOTE: Thyroglobulin flagging is based on athyrotic  reference values.     The thyroglobulin and thyroglobulin antibody testing   methods are immunoenzymatic assays manufactured by [employer]. and performed on the [place] 800.     Values obtained from different assay methods or kits  may be different and cannot be used interchangeably.     The results cannot be  interpreted as absolute evidence  for the presence or absence of malignant disease.     Test Performed by:  [facility]  [id number], [place] [address]  Lab Director: Curt Jews                            M.D. Ph.D.; CLIA# [id number]   Appointment on 10/24/2018   Component Date Value Ref Range Status   . TSH 10/24/2018 0.038* 0.350 - 5.000 uIU/mL Final       Assessment/Plan:  Patient is stable.  Feels like he is doing well.  Will try to cut his Plaquenil to once a day.  He would like me to mail his lab orders to am so will go ahead and do that.      ICD-10-CM    1. Rheumatoid arthritis involving right hip with positive rheumatoid factor (CMS HCC)  M05.751    He would like to return in February and he will schedule that.  This will be when he has another trip appear to [place] from [place].  No orders of the defined types were placed in this encounter.  I spent 12 minutes on this video visit with the patient    Gerald Leitz, MD

## 2019-03-17 ENCOUNTER — Encounter (HOSPITAL_BASED_OUTPATIENT_CLINIC_OR_DEPARTMENT_OTHER): Payer: Self-pay | Admitting: Family Medicine

## 2019-03-20 ENCOUNTER — Ambulatory Visit (HOSPITAL_BASED_OUTPATIENT_CLINIC_OR_DEPARTMENT_OTHER): Payer: PRIVATE HEALTH INSURANCE | Admitting: Family

## 2019-04-02 ENCOUNTER — Encounter (HOSPITAL_BASED_OUTPATIENT_CLINIC_OR_DEPARTMENT_OTHER): Payer: Self-pay | Admitting: NURSE PRACTITIONER

## 2019-04-02 LAB — ENTER/EDIT EXTERNAL COMMON LAB RESULTS
ALBUMIN (SERUM): 4.2
ALKALINE PHOSPHATASE: 71
ALT (SGPT): 12
AST (SGOT): 13
BILIRUBIN, TOTAL: 0.6
BUN: 21
CALCIUM: 9
CARBON DIOXIDE: 24
CHLORIDE: 102
CREATININE: 1.64 mg/dL — ABNORMAL HIGH (ref 0.76–1.27)
GLOBULIN, TOTAL: 2.6 g/dL
GLUCOSE, FASTING: 83
POTASSIUM: 4.9
SODIUM: 140
TOTAL PROTEIN: 6.8

## 2019-04-03 ENCOUNTER — Telehealth (HOSPITAL_BASED_OUTPATIENT_CLINIC_OR_DEPARTMENT_OTHER): Payer: Self-pay | Admitting: Rheumatology

## 2019-04-03 NOTE — Nursing Note (Signed)
Lab results received and entered.  Collection date of 04-02-19.  Oscar La, LPN  [id number], [id number]

## 2019-04-09 ENCOUNTER — Encounter (HOSPITAL_BASED_OUTPATIENT_CLINIC_OR_DEPARTMENT_OTHER): Payer: Self-pay | Admitting: Family Medicine

## 2019-04-14 ENCOUNTER — Encounter (HOSPITAL_BASED_OUTPATIENT_CLINIC_OR_DEPARTMENT_OTHER): Payer: Self-pay | Admitting: Rheumatology

## 2019-04-14 ENCOUNTER — Other Ambulatory Visit (HOSPITAL_BASED_OUTPATIENT_CLINIC_OR_DEPARTMENT_OTHER): Payer: Self-pay | Admitting: NURSE PRACTITIONER

## 2019-04-14 ENCOUNTER — Encounter (HOSPITAL_BASED_OUTPATIENT_CLINIC_OR_DEPARTMENT_OTHER): Payer: Self-pay | Admitting: NURSE PRACTITIONER

## 2019-04-16 NOTE — Telephone Encounter (Signed)
-----   Message from Orpah Cobb sent at 04/14/2019  7:17 AM EST -----  Regarding: Prescription Question  Contact: [phone]  I'm having trouble with my feet. I should go back to taking two Hydroxychlor 200. Is this ok with you. We dropped down to 1 several weeks ago. Thanks

## 2019-04-16 NOTE — Telephone Encounter (Signed)
Current Outpatient Medications   Medication Sig   . alpha lipoic acid 600 mg Oral Tablet Take by mouth   . ascorbic acid (VITAMIN C) 1,000 mg Oral Tablet Take 1,000 mg by mouth Once a day   . aspirin (ECOTRIN) 81 mg Oral Tablet, Delayed Release (E.C.) Take 81 mg by mouth Once a day   . atorvastatin (LIPITOR) 40 mg Oral Tablet Take 1 Tab (40 mg total) by mouth Every evening (Patient not taking: Reported on 01/16/2019)   . calcium carbonate/vitamin D3 (VITAMIN D-3 ORAL) Take by mouth 1000 IU daily   . Cholecalciferol, Vitamin D3, (VITAMIN D) 25 mcg (1,000 unit) Oral Capsule Take by mouth   . Ciclopirox 8 % Solution Apply 1 Applicatorful topically Every night (Patient not taking: Reported on 12/18/2018)   . coenzyme Q10 30 mg Oral Capsule Take 100 mg by mouth Every evening with dinner   . diazePAM (VALIUM) 5 mg Oral Tablet Take 1 Tab (5 mg total) by mouth Once, as needed for Anxiety (take 1-2 prior to mri prn) for up to 1 dose (Patient not taking: Reported on 01/16/2019)   . folic acid (FOLVITE) 1 mg Oral Tablet Take 1 Tab (1 mg total) by mouth Once a day   . hydrOXYchloroQUINE (PLAQUENIL) 200 mg Oral Tablet Take 2 Tabs (400 mg total) by mouth Once a day   . levothyroxine (SYNTHROID) 150 mcg Oral Tablet Take 1 Tab (150 mcg total) by mouth Every morning   . losartan (COZAAR) 50 mg Oral Tablet Take 1 Tab (50 mg total) by mouth Once a day In am (Patient not taking: Reported on 01/16/2019)   . MetFORMIN (GLUCOPHAGE) 1,000 mg Oral Tablet Take 0.5 Tabs (500 mg total) by mouth Twice daily with food (Patient not taking: Reported on 09/13/2018)   . methotrexate, PF, 25 mg/mL Injection 0.6 mL (15 mg total) by Subcutaneous route Every 7 days   . mupirocin (BACTROBAN) 2 % Nasal Ointment Apply topically Twice daily   . UNKNOWN MEDICATION (UNKNOWN MEDICATION) 300 mg benfotiamine   . vitamin D3-vitamin K2, MK4, (K2 PLUS D3) 1,000-100 unit-mcg Oral Tablet Take by mouth Once a day     Last dept visit:  06/14/2018  Next pending dept visit:  Visit date not found  Refill: Levo - Pending Provider.  Merleen Milliner, [place] 04/16/2019, 07:41

## 2019-04-17 NOTE — Telephone Encounter (Signed)
Regarding: RE: Prescription Question  Contact: [phone]  Pain to the bottom a d side of my right foot. Started back taking 2 Hydroxychloroquine per day. Thanks    ----- Message -----  From: Cesar Leitz, MD  Sent: 04/17/19 11:26 AM  To: [name]  Subject: RE: Prescription Question    [name],  What kind of problems are you having with your feet?  If you are having more pain and stiffness, it would be reasonable to try increasing the hydroxychloroquine back to 200 mg twice a day.  Take care,  Sharion Dove, MD      ----- Message -----       From:[name]       Sent:04/14/2019  7:17 AM EST         To:Jo Arnold Long, MD    Subject:Prescription Question    I'm having trouble with my feet. I should go back to taking two Hydroxychlor 200. Is this ok with you. We dropped down to 1 several weeks ago. Thanks

## 2019-04-18 LAB — ENTER/EDIT EXTERNAL COMMON LAB RESULTS: TSH: 8.62 — ABNORMAL HIGH

## 2019-04-24 ENCOUNTER — Encounter (HOSPITAL_BASED_OUTPATIENT_CLINIC_OR_DEPARTMENT_OTHER): Payer: Self-pay | Admitting: NURSE PRACTITIONER

## 2019-04-26 ENCOUNTER — Other Ambulatory Visit: Payer: Self-pay

## 2019-04-26 ENCOUNTER — Telehealth (HOSPITAL_BASED_OUTPATIENT_CLINIC_OR_DEPARTMENT_OTHER): Payer: Self-pay | Admitting: NURSE PRACTITIONER

## 2019-04-26 ENCOUNTER — Ambulatory Visit: Payer: PRIVATE HEALTH INSURANCE | Attending: Family | Admitting: Family

## 2019-04-26 DIAGNOSIS — N183 Chronic kidney disease, stage 3 unspecified (CMS HCC): Secondary | ICD-10-CM

## 2019-04-26 DIAGNOSIS — E1122 Type 2 diabetes mellitus with diabetic chronic kidney disease: Secondary | ICD-10-CM | POA: Insufficient documentation

## 2019-04-26 DIAGNOSIS — I10 Essential (primary) hypertension: Secondary | ICD-10-CM

## 2019-04-26 DIAGNOSIS — E1129 Type 2 diabetes mellitus with other diabetic kidney complication: Secondary | ICD-10-CM

## 2019-04-26 DIAGNOSIS — I129 Hypertensive chronic kidney disease with stage 1 through stage 4 chronic kidney disease, or unspecified chronic kidney disease: Secondary | ICD-10-CM | POA: Insufficient documentation

## 2019-04-26 NOTE — Telephone Encounter (Signed)
Received external labs from [facility], entered and placed in provider's box for review. Cyndia Degraff, RN  04/26/2019, 10:23

## 2019-04-26 NOTE — Progress Notes (Signed)
[place], [place]  [place] [address]  Operated by [place]  Video Visit     Name: [name]  MRN: [id number]    Date: 04/26/2019  Age: 63 y.o.                            Patient's location: [place] [address]   Patient/family aware of provider location: Yes  Patient/family consent for video visit: Yes  Interview and observation performed by: Daneil Dan, APRN,FNP-BC    Chief Complaint: 64 y.o. Caucasian male followed for CKD stage 3 with risk factors of HTN, diabetes, h/o NSAID use with arthritis, and IV contrast dye exposures.  Creatinines ranging 1.4-1.7 since at least 2016.      Subjective Data:  Patient without c/o or issue.  States that he has lost some weight with exercise and diet.  He is no longer on Metformin or Losartan.  He states his doctor told him that he no longer needed them.  BPs run 103-120/70s.  His TSH levels were low a few months ago and most recently high.   He is waiting for instruction from his endocrinologist.  Review of Systems:  No chest pain or dyspnea. No lower extremity edema. Appetite "good." Denies N/V. No fever or chills. He will get the COVID vaccine when it is available to him.  All other pertinent systems negative.    Past Medical History:  Patient Active Problem List   Diagnosis   . Primary osteoarthritis of right hip   . Type 2 diabetes mellitus with renal complication (CMS HCC)   . Rheumatoid arthritis involving right hip (CMS HCC)   . Osteoarthritis of right hip   . Pain of left hip joint   . Primary osteoarthritis of left hip   . CKD (chronic kidney disease) stage 3, GFR 30-59 ml/min   . Essential hypertension   . Hypothyroidism   . Rheumatoid arthritis (CMS [place])   . Thyroid nodule     Medications:  Current Outpatient Medications   Medication Sig   . alpha lipoic acid 600 mg Oral Tablet Take by mouth   . ascorbic acid (VITAMIN C) 1,000 mg Oral Tablet Take 1,000 mg by mouth Once a day   .  aspirin (ECOTRIN) 81 mg Oral Tablet, Delayed Release (E.C.) Take 81 mg by mouth Once a day   . calcium carbonate/vitamin D3 (VITAMIN D-3 ORAL) Take by mouth 1000 IU daily   . Cholecalciferol, Vitamin D3, (VITAMIN D) 25 mcg (1,000 unit) Oral Capsule Take by mouth   . coenzyme Q10 30 mg Oral Capsule Take 100 mg by mouth Every evening with dinner   . folic acid (FOLVITE) 1 mg Oral Tablet Take 1 Tab (1 mg total) by mouth Once a day   . hydrOXYchloroQUINE (PLAQUENIL) 200 mg Oral Tablet Take 2 Tabs (400 mg total) by mouth Once a day   . levothyroxine (SYNTHROID) 150 mcg Oral Tablet TAKE 1 TABLET BY MOUTH ONCE DAILY IN THE MORNING   . methotrexate, PF, 25 mg/mL Injection 0.6 mL (15 mg total) by Subcutaneous route Every 7 days   . mupirocin (BACTROBAN) 2 % Nasal Ointment Apply topically Twice daily   . vitamin D3-vitamin K2, MK4, (K2 PLUS D3) 1,000-100 unit-mcg Oral Tablet Take by mouth Once a day         Observational Exam:   Well appearing.  In no acute  distress.  Very pleasant and cooperative.  Appears younger than his stated age.  Head is normocephalic and atraumatic.  Eyes anicteric.    Data Reviewed:   Lab Results   Component Value Date    BUN 21 04/02/2019    BUN 25 12/18/2018    BUN 21 08/31/2018    BUN 21 08/31/2018    CREATININE 1.64 (H) 04/02/2019    CREATININE 1.66 (H) 12/18/2018    CREATININE 1.74 (H) 08/31/2018    CREATININE 1.74 (H) 08/31/2018    BUNCRRATIO 15 12/18/2018    BUNCRRATIO 15 06/13/2018    BUNCRRATIO 21 05/25/2018    GFR 43 (L) 12/18/2018    GFR 44 (L) 06/13/2018    GFR 40 05/25/2018    SODIUM 140 04/02/2019    SODIUM 140 12/18/2018    SODIUM 145 (H) 08/31/2018    SODIUM 145 (H) 08/31/2018    POTASSIUM 4.9 04/02/2019    POTASSIUM 4.3 12/18/2018    POTASSIUM 4.6 08/31/2018    POTASSIUM 4.6 08/31/2018    CHLORIDE 102 04/02/2019    CHLORIDE 107 12/18/2018    CHLORIDE 106 08/31/2018    CHLORIDE 106 08/31/2018    CO2 24 04/02/2019    CO2 23 12/18/2018    CO2 23 08/31/2018    CO2 23 08/31/2018     ANIONGAP 10 12/18/2018    ANIONGAP 10 06/13/2018    ANIONGAP 10 06/16/2017    CALCIUM 9.0 04/02/2019    CALCIUM 8.6 12/18/2018    CALCIUM 9.3 08/31/2018    CALCIUM 9.4 08/31/2018    PHOSPHORUS 3.2 08/31/2018    PHOSPHORUS 3.2 08/31/2018    PHOSPHORUS 3.1 05/25/2018    ALBUMIN 3.5 12/18/2018    ALBUMIN 4.3 05/25/2018    HGB 12.7 (L) 12/18/2018    HGB 13.8 08/31/2018    HGB 13.8 08/31/2018    HCT 37.6 (L) 12/18/2018    HCT 40.4 08/31/2018    HCT 40.4 08/31/2018    INTACTPTH 44 08/31/2018    HA1C 6.3 (H) 12/18/2018    HA1C 6.1 (H) 06/13/2018       URINALYSIS  APPEARANCE (no units)   Date Value   08/31/2018 clear   08/31/2018 clear     COLOR (no units)   Date Value   08/31/2018 yellow   08/31/2018 yellow     SPECIFIC GRAVITY, URINE (no units)   Date Value   08/31/2018 1.024   08/31/2018 1.024     UROBILINOGEN (no units)   Date Value   08/31/2018 0.2   08/31/2018 0.2     KETONES (no units)   Date Value   08/31/2018 neg   08/31/2018 neg     GLUCOSE (no units)   Date Value   08/31/2018 neg   08/31/2018 neg     BILIRUBIN (no units)   Date Value   08/31/2018 neg   08/31/2018 neg     BLOOD (no units)   Date Value   08/31/2018 trace (A)   08/31/2018 trace (A)     PROTEIN (no units)   Date Value   08/31/2018 neg   08/31/2018 neg     NITRITE (no units)   Date Value   08/31/2018 neg   08/31/2018 neg         Assessment/Plan:    1.CKD stage 3 with risk factors of HTN, diabetes, h/o NSAID use with arthritis, and IV contrast dye exposures.  Creatinines ranging 1.4-1.7 since at least 2016.   Most recent creatinine level indicates stability  at 1.64.    Urine testing has been without overt proteinuria.  He will continue avoiding nephrotoxic agents such as NSAIDs and IV contrast dye exposure. I also discussed with him the importance of adequate hydration. Understanding was verbalized.    2. Anemia.  No clinical evidence.  H/H well within target.  3. Bone mineral disease.  Calcium, phorphorus, vitamin d and iPTH good.   4. HTN.  BP  well controlled.  No longer on ARB.  5.  Diabetes.  A1c's have been below 7. Continue to follow with endocrine.  No overt proteinuria.      I will conduct a video visit with him in 4 months.                    Rene Kocher. Eulas Post, [facility]                Nephrology Nurse Practitioner                [facility]

## 2019-04-27 ENCOUNTER — Encounter (HOSPITAL_BASED_OUTPATIENT_CLINIC_OR_DEPARTMENT_OTHER): Payer: Self-pay | Admitting: Family Medicine

## 2019-04-28 ENCOUNTER — Other Ambulatory Visit (HOSPITAL_BASED_OUTPATIENT_CLINIC_OR_DEPARTMENT_OTHER): Payer: Self-pay | Admitting: Rheumatology

## 2019-04-29 NOTE — Telephone Encounter (Signed)
Hi  Your TSH is much to high now.  Please take 150 mcg daily for 6 days then 7th day take 2 tabs (300 mcg).  Please check TSH again in 4-5 weeks  I will see you in a few weeks to further discuss.  Lekeya Rollings Bryon Lions, APRN,NP-C  Endocrinology, Diabetes & Metabolism   Department of Medicine

## 2019-05-03 ENCOUNTER — Encounter (HOSPITAL_BASED_OUTPATIENT_CLINIC_OR_DEPARTMENT_OTHER): Payer: Self-pay | Admitting: Rheumatology

## 2019-05-03 ENCOUNTER — Other Ambulatory Visit: Payer: Self-pay

## 2019-05-10 ENCOUNTER — Encounter (HOSPITAL_BASED_OUTPATIENT_CLINIC_OR_DEPARTMENT_OTHER): Payer: Self-pay | Admitting: Family Medicine

## 2019-05-10 ENCOUNTER — Other Ambulatory Visit: Payer: Self-pay

## 2019-05-10 ENCOUNTER — Ambulatory Visit: Payer: PRIVATE HEALTH INSURANCE | Attending: Family Medicine | Admitting: Family Medicine

## 2019-05-10 DIAGNOSIS — I10 Essential (primary) hypertension: Secondary | ICD-10-CM

## 2019-05-10 DIAGNOSIS — Z1159 Encounter for screening for other viral diseases: Secondary | ICD-10-CM

## 2019-05-10 DIAGNOSIS — R918 Other nonspecific abnormal finding of lung field: Secondary | ICD-10-CM | POA: Insufficient documentation

## 2019-05-10 DIAGNOSIS — E119 Type 2 diabetes mellitus without complications: Secondary | ICD-10-CM

## 2019-05-10 DIAGNOSIS — E785 Hyperlipidemia, unspecified: Secondary | ICD-10-CM

## 2019-05-10 DIAGNOSIS — E039 Hypothyroidism, unspecified: Secondary | ICD-10-CM

## 2019-05-10 DIAGNOSIS — Z79899 Other long term (current) drug therapy: Secondary | ICD-10-CM | POA: Insufficient documentation

## 2019-05-10 MED ORDER — AMOXICILLIN 875 MG TABLET
875.00 mg | ORAL_TABLET | Freq: Two times a day (BID) | ORAL | 0 refills | Status: DC
Start: 2019-05-10 — End: 2019-05-21

## 2019-05-10 NOTE — Progress Notes (Signed)
I personally offered the service to the patient, and obtained verbal consent to provide this service.    [name], [place]  [place] [address]  Operated by [facility]  Telephone Visit    Name:  [name] MRN: [id number]   Date:  05/10/2019 Age:   64 y.o.     The patient/family initiated a request for telephone service.  Verbal consent for this service was obtained from the patient/family.    Last office visit in this department: 09/11/2018      Reason for call: follow up  Call notes:    Patient has hypertension. Patient is tolerating treatment regimen without side effects. Blood pressures at home are well controlled.     Hyperlipidemia -  This is well controlled on medication therapy. Denies side effects at this time. Patient doing well without complaints related to lipids today. Lab work is not due.     Patient has diabetes mellitus. Sugars are currently controlled. no hypoglycemic symptoms. Patient tolerating treatment regimen well and is compliant. Complications include: none.     Known pulmonary nodules, due for repeat in next 1-2 months. asymptomatic       ICD-10-CM    1. Essential hypertension  I10    2. Hyperlipidemia, unspecified hyperlipidemia type  E78.5    3. Diabetes mellitus (CMS [place])  E11.9          (I10) Essential hypertension  (primary encounter diagnosis)  Plan: well controlled  Continue medication     (E78.5) Hyperlipidemia, unspecified hyperlipidemia type  Plan: stable  continue medication     (E11.9) Diabetes mellitus (CMS [place])  Plan: stable  Continue medication         Pulmonary nodules  Due for repeat ct scan, will order      Flu shot to be given on 05/21/2019 when he is in town.    Total provider time spent with the patient on the phone: w30 minutes.    [name], MD

## 2019-05-12 ENCOUNTER — Other Ambulatory Visit (HOSPITAL_BASED_OUTPATIENT_CLINIC_OR_DEPARTMENT_OTHER): Payer: Self-pay | Admitting: Rheumatology

## 2019-05-14 NOTE — Telephone Encounter (Signed)
Last Visit 03/15/2019  Follow Up 09/17/2019  Labs 04/02/2019    Dawson Bills, MA  05/14/2019, 08:37

## 2019-05-15 ENCOUNTER — Other Ambulatory Visit: Payer: Self-pay

## 2019-05-15 ENCOUNTER — Other Ambulatory Visit (HOSPITAL_BASED_OUTPATIENT_CLINIC_OR_DEPARTMENT_OTHER): Payer: Self-pay | Admitting: Rheumatology

## 2019-05-15 ENCOUNTER — Ambulatory Visit (HOSPITAL_BASED_OUTPATIENT_CLINIC_OR_DEPARTMENT_OTHER): Payer: Self-pay | Admitting: Rheumatology

## 2019-05-15 DIAGNOSIS — Z79899 Other long term (current) drug therapy: Secondary | ICD-10-CM

## 2019-05-15 NOTE — Telephone Encounter (Signed)
Regarding: pt having CT scan  ----- Message from Levin Erp sent at 05/15/2019  3:37 PM EST -----  Gerald Leitz, MD    Patient calling to let you know he is going to have CT scan on 05-21-2019.  He wanted you to know, so you could look at it.    He stated he will be out of methotrexate for 2 weeks.  He will be in [place] on the 15th

## 2019-05-15 NOTE — Telephone Encounter (Signed)
Spoke with the pt earlier today. He is due for updated MTX lab prior to refills.  Donnetta Simpers, MA  05/15/2019, 15:37

## 2019-05-15 NOTE — Telephone Encounter (Signed)
Called pt to see about updated labs. Pt states that he has not had any and he was not aware he had to have labs for refills. Advised pt that we need updated labs every 3 months. Pt unable to complete until 2/15. Placed standing lab orders.  Donnetta Simpers, MA  05/15/2019, 10:07

## 2019-05-16 NOTE — Telephone Encounter (Signed)
Labs done 12/28

## 2019-05-17 ENCOUNTER — Encounter (HOSPITAL_BASED_OUTPATIENT_CLINIC_OR_DEPARTMENT_OTHER): Payer: Self-pay | Admitting: NURSE PRACTITIONER

## 2019-05-17 ENCOUNTER — Ambulatory Visit (HOSPITAL_BASED_OUTPATIENT_CLINIC_OR_DEPARTMENT_OTHER): Payer: Self-pay | Admitting: Family Medicine

## 2019-05-17 NOTE — Telephone Encounter (Signed)
Pharmacy sent fax notifying provider there is an interaction between Amoxicillin 875mg  tablet and methotrexate (prescribed by Dr. Victory Dakin). Pharmacist states this could cause toxicity. Please advise.  Anner Crete, LPN  [id number], [id number]

## 2019-05-17 NOTE — Telephone Encounter (Signed)
Spoke with pharmacy who stated that they did fill the amoxiclling, appropriate as patient is on low dose methotrexate for RA unlikely to cause clinical significant interaction.

## 2019-05-21 ENCOUNTER — Other Ambulatory Visit: Payer: Self-pay

## 2019-05-21 ENCOUNTER — Ambulatory Visit (HOSPITAL_BASED_OUTPATIENT_CLINIC_OR_DEPARTMENT_OTHER)
Admission: RE | Admit: 2019-05-21 | Discharge: 2019-05-21 | Disposition: A | Discharge status: Home / Self Care | Payer: PRIVATE HEALTH INSURANCE | Source: Ambulatory Visit

## 2019-05-21 ENCOUNTER — Ambulatory Visit (HOSPITAL_BASED_OUTPATIENT_CLINIC_OR_DEPARTMENT_OTHER): Payer: PRIVATE HEALTH INSURANCE

## 2019-05-21 ENCOUNTER — Ambulatory Visit
Admission: RE | Admit: 2019-05-21 | Discharge: 2019-05-21 | Disposition: A | Discharge status: Home / Self Care | Payer: PRIVATE HEALTH INSURANCE | Source: Ambulatory Visit | Attending: NURSE PRACTITIONER | Admitting: NURSE PRACTITIONER

## 2019-05-21 ENCOUNTER — Other Ambulatory Visit (HOSPITAL_BASED_OUTPATIENT_CLINIC_OR_DEPARTMENT_OTHER): Payer: Self-pay

## 2019-05-21 DIAGNOSIS — C73 Malignant neoplasm of thyroid gland: Secondary | ICD-10-CM

## 2019-05-21 DIAGNOSIS — R590 Localized enlarged lymph nodes: Secondary | ICD-10-CM

## 2019-05-21 DIAGNOSIS — Z23 Encounter for immunization: Secondary | ICD-10-CM | POA: Insufficient documentation

## 2019-05-21 DIAGNOSIS — R918 Other nonspecific abnormal finding of lung field: Secondary | ICD-10-CM

## 2019-05-21 DIAGNOSIS — Z1159 Encounter for screening for other viral diseases: Secondary | ICD-10-CM

## 2019-05-21 DIAGNOSIS — Z79899 Other long term (current) drug therapy: Secondary | ICD-10-CM

## 2019-05-21 DIAGNOSIS — Z9009 Acquired absence of other part of head and neck: Secondary | ICD-10-CM

## 2019-05-21 DIAGNOSIS — E039 Hypothyroidism, unspecified: Secondary | ICD-10-CM

## 2019-05-21 LAB — CBC WITH DIFF
BASOPHIL #: <0.1 10*3/uL (ref <=0.20)
BASOPHIL %: 1 %
EOSINOPHIL #: 0.41 10*3/uL (ref <=0.50)
EOSINOPHIL %: 6 %
HCT: 42.1 % (ref 38.9–52.0)
HGB: 14.3 g/dL (ref 13.4–17.5)
IMMATURE GRANULOCYTE #: <0.1 10*3/uL (ref <0.10)
IMMATURE GRANULOCYTE %: 0 % (ref 0–1)
LYMPHOCYTE #: 0.8 10*3/uL — ABNORMAL LOW (ref 1.00–4.80)
LYMPHOCYTE %: 11 %
MCH: 32.5 pg — ABNORMAL HIGH (ref 26.0–32.0)
MCHC: 34 g/dL (ref 31.0–35.5)
MCV: 95.7 fL (ref 78.0–100.0)
MONOCYTE #: 0.67 10*3/uL (ref 0.20–1.10)
MONOCYTE %: 10 %
MPV: 10.8 fL (ref 8.7–12.5)
NEUTROPHIL #: 5.08 10*3/uL (ref 1.50–7.70)
NEUTROPHIL %: 72 %
PLATELETS: 193 10*3/uL (ref 150–400)
RBC: 4.4 10*6/uL — ABNORMAL LOW (ref 4.50–6.10)
RDW-CV: 13.7 % (ref 11.5–15.5)
WBC: 7.1 10*3/uL (ref 3.7–11.0)

## 2019-05-21 LAB — ALBUMIN: ALBUMIN: 3.6 g/dL (ref 3.4–4.8)

## 2019-05-21 LAB — THYROID STIMULATING HORMONE (SENSITIVE TSH): TSH: 8.817 u[IU]/mL — ABNORMAL HIGH (ref 0.350–5.000)

## 2019-05-21 LAB — AST (SGOT): AST (SGOT): 12 U/L (ref 8–48)

## 2019-05-21 LAB — HEPATITIS C ANTIBODY SCREEN WITH REFLEX TO HCV PCR: HCV ANTIBODY QUALITATIVE: REACTIVE — AB

## 2019-05-21 LAB — CREATININE WITH EGFR
CREATININE: 1.68 mg/dL — ABNORMAL HIGH (ref 0.62–1.27)
ESTIMATED GFR: 42 mL/min/{1.73_m2} — ABNORMAL LOW (ref >60)

## 2019-05-21 LAB — ALT (SGPT): ALT (SGPT): 11 U/L (ref <55)

## 2019-05-21 MED ORDER — METHOTREXATE SODIUM (PF) 25 MG/ML INJECTION SOLUTION
15.00 mg | INTRAMUSCULAR | 1 refills | Status: AC
Start: 2019-05-21 — End: ?

## 2019-05-21 NOTE — Nursing Note (Signed)
1. Are you 65 years of age or older? yes  2. Have you ever had a severe reaction to a flu shot? no  3. Are you allergic to eggs? no  4. Are you allergic to latex? no  5. Are you allergic to Thimerosol? no  6. Are you experiencing acute illness symptoms or have you been running a fever? no  7. Do you have a medical condition or taking medications that suppress your immune system? no  8. Have you ever had Guillain-Barre syndrome or other neurologic disorder? no  9. Are you pregnant or breastfeeding? no    Immunization administered     Name Date Dose VIS Date Route    Influenza Vaccine, 6 month-adult 05/21/2019 0.5 mL 11/17/2017 Intramuscular    Site: Right deltoid    Given By: Sanjuana Letters, MA    Manufacturer: [employer]    Lot: [id number]    NDCVB:[id number]          Sanjuana Letters, [place] 05/21/2019, 09:50

## 2019-05-22 ENCOUNTER — Encounter (HOSPITAL_BASED_OUTPATIENT_CLINIC_OR_DEPARTMENT_OTHER): Payer: Self-pay | Admitting: Orthopaedic Surgery

## 2019-05-22 ENCOUNTER — Encounter (HOSPITAL_BASED_OUTPATIENT_CLINIC_OR_DEPARTMENT_OTHER): Payer: Self-pay | Admitting: Rheumatology

## 2019-05-22 ENCOUNTER — Encounter (HOSPITAL_BASED_OUTPATIENT_CLINIC_OR_DEPARTMENT_OTHER): Payer: Self-pay | Admitting: NURSE PRACTITIONER

## 2019-05-22 LAB — HEPATITIS C VIRUS (HCV) RNA DETECTION AND QUANTIFICATION, PCR, PLASMA: HCV QUANTITATIVE PCR: NOT DETECTED

## 2019-05-29 ENCOUNTER — Other Ambulatory Visit (HOSPITAL_BASED_OUTPATIENT_CLINIC_OR_DEPARTMENT_OTHER): Payer: Self-pay

## 2019-06-19 ENCOUNTER — Other Ambulatory Visit (HOSPITAL_BASED_OUTPATIENT_CLINIC_OR_DEPARTMENT_OTHER): Payer: Self-pay

## 2019-06-20 ENCOUNTER — Encounter (HOSPITAL_BASED_OUTPATIENT_CLINIC_OR_DEPARTMENT_OTHER): Payer: Self-pay | Admitting: Rheumatology

## 2019-06-20 NOTE — Telephone Encounter (Signed)
-----   Message from Orpah Cobb sent at 06/20/2019  7:16 AM EDT -----  Regarding: Non-Urgent Medical Question  Contact: [phone]  I have an appointment next Tuesday to get my vaccine for COVID-19.  Do I need to do anything. Some people say don't take it, some say skip your meds a week before and the week after. What do you recommend.   Thanks  Orpah Cobb

## 2019-06-21 ENCOUNTER — Encounter (HOSPITAL_BASED_OUTPATIENT_CLINIC_OR_DEPARTMENT_OTHER): Payer: Self-pay | Admitting: Family Medicine

## 2019-07-16 ENCOUNTER — Encounter (HOSPITAL_BASED_OUTPATIENT_CLINIC_OR_DEPARTMENT_OTHER): Payer: Self-pay

## 2019-07-21 ENCOUNTER — Other Ambulatory Visit (HOSPITAL_BASED_OUTPATIENT_CLINIC_OR_DEPARTMENT_OTHER): Payer: Self-pay | Admitting: Rheumatology

## 2019-07-23 NOTE — Telephone Encounter (Signed)
LV 03/15/2019  NV not scheduled  LL 05/21/2019  Georgette Shell, [place]  07/23/2019, 09:29

## 2019-07-27 ENCOUNTER — Encounter (HOSPITAL_BASED_OUTPATIENT_CLINIC_OR_DEPARTMENT_OTHER): Payer: Self-pay | Admitting: Rheumatology

## 2019-07-28 ENCOUNTER — Other Ambulatory Visit (HOSPITAL_BASED_OUTPATIENT_CLINIC_OR_DEPARTMENT_OTHER): Payer: Self-pay | Admitting: Rheumatology

## 2019-07-30 NOTE — Telephone Encounter (Signed)
LV 03/14/2020  NV 10/04/2019  LL 05/21/2019  Georgette Shell, MA  07/30/2019, 10:01

## 2019-07-31 MED ORDER — HYDROXYCHLOROQUINE 200 MG TABLET
200.00 mg | ORAL_TABLET | Freq: Two times a day (BID) | ORAL | 0 refills | Status: DC
Start: 2019-07-31 — End: 2019-09-01

## 2019-08-09 ENCOUNTER — Encounter (HOSPITAL_BASED_OUTPATIENT_CLINIC_OR_DEPARTMENT_OTHER): Payer: Self-pay | Admitting: NURSE PRACTITIONER

## 2019-08-14 ENCOUNTER — Encounter (HOSPITAL_BASED_OUTPATIENT_CLINIC_OR_DEPARTMENT_OTHER): Payer: Self-pay | Admitting: Rheumatology

## 2019-08-15 NOTE — Telephone Encounter (Signed)
-----   Message from Orpah Cobb sent at 08/14/2019  7:29 PM EDT -----  Regarding: Non-Urgent Medical Question  Contact: [phone]  I'm having alot of pain on my hands and feet. Should I increase my medication .6 to 1.0 on my methotrexate  Thanks

## 2019-08-17 NOTE — Telephone Encounter (Signed)
Regarding: RE: Non-Urgent Medical Question  Contact: [phone]  I started having more pain when I skipped my meds  to get the COVID-19 shot. I'm feeling better today. Hopefully it will get better in the next week. I was there last week in [place].  36 hours of driving.  If it don't get better, I will make another trip. Let's wait another week to see how I'm doing.  Thanks    ----- Message -----  From: Gerald Leitz, MD  Sent: 08/17/19 10:14 AM  To: Orpah Cobb  Subject: RE: Non-Urgent Medical Question    Mr. Favret,  I am sorry to hear you are having problems.   Please schedule an appointment to be seen so I can examine your joints and determine appropriate treatment.  Thank you,  Sharion Dove      ----- Message -----       From:Sullivan Enriqueta Shutter       Sent:08/14/2019  7:29 PM EDT         To:Jo Arnold Long, MD    Subject:Non-Urgent Medical Question    I'm having alot of pain on my hands and feet. Should I increase my medication .6 to 1.0 on my methotrexate  Thanks

## 2019-08-28 ENCOUNTER — Ambulatory Visit (HOSPITAL_BASED_OUTPATIENT_CLINIC_OR_DEPARTMENT_OTHER): Payer: PRIVATE HEALTH INSURANCE | Admitting: Family

## 2019-09-01 ENCOUNTER — Other Ambulatory Visit (HOSPITAL_BASED_OUTPATIENT_CLINIC_OR_DEPARTMENT_OTHER): Payer: Self-pay | Admitting: Rheumatology

## 2019-09-04 MED ORDER — HYDROXYCHLOROQUINE 200 MG TABLET
200.00 mg | ORAL_TABLET | Freq: Two times a day (BID) | ORAL | 0 refills | Status: AC
Start: 2019-09-04 — End: ?

## 2019-09-17 ENCOUNTER — Encounter (HOSPITAL_BASED_OUTPATIENT_CLINIC_OR_DEPARTMENT_OTHER): Payer: Self-pay | Admitting: Rheumatology

## 2019-09-20 IMAGING — CT CT CHEST WITHOUT CONTRAST
2 of 3 series · 15 of 36 positions shown, 18 images · non-contrast
Comparison: CT chest May 21, 2019 less than one year, also December 19, 2018

CT CHEST WITHOUT CONTRAST, 09/20/2019 [DATE]: 
CLINICAL INDICATION: Multiple lung nodules, shortness of breath 
A search for DICOM formatted images was conducted for prior CT imaging studies 
completed at a non-affiliated media free facility.
TECHNIQUE: The chest was scanned from base of neck through the lung bases 
without contrast on a high resolution low dose CT scanner.  Routine MPR and MIP 
3D renderings were reconstructed on an independent workstation with concurrent 
physician supervision.

[Series 2: chest 2.0 i31s 3 · axial · 0.94mm/px · z∈[-351,+1]mm · 12 of 208 slices shown, 15 images]
[im 16/208  mediastinal]
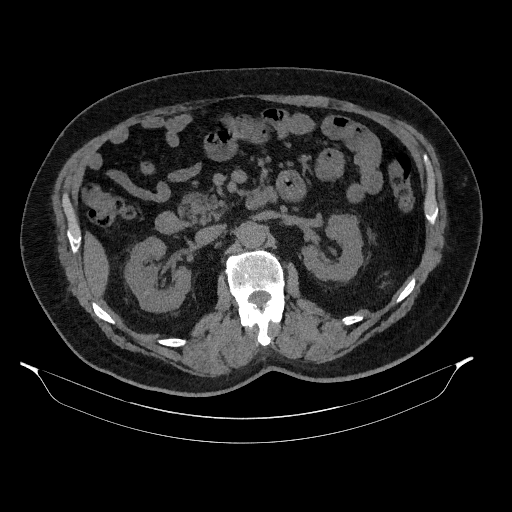
[im 16/208  lung]
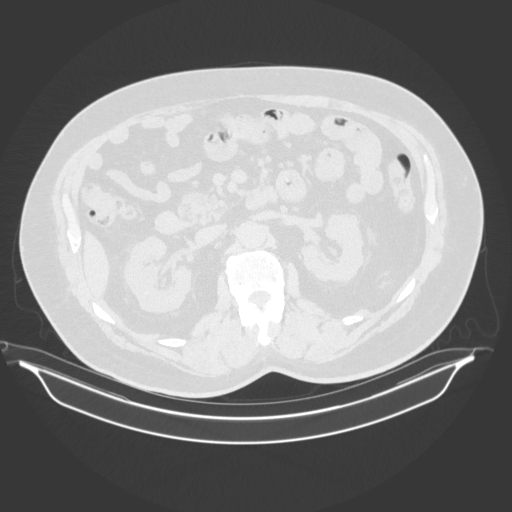
[im 31/208  lung]
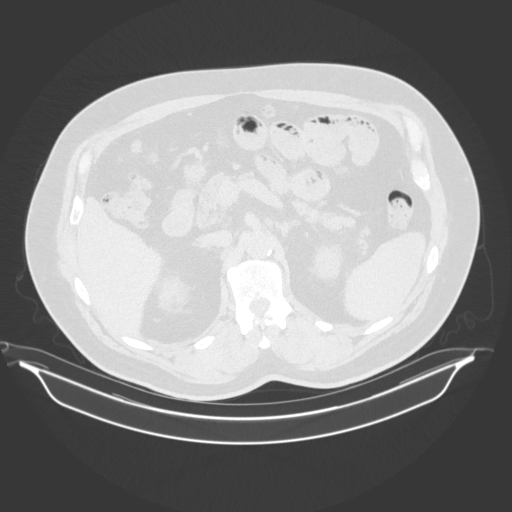
[im 47/208  lung]
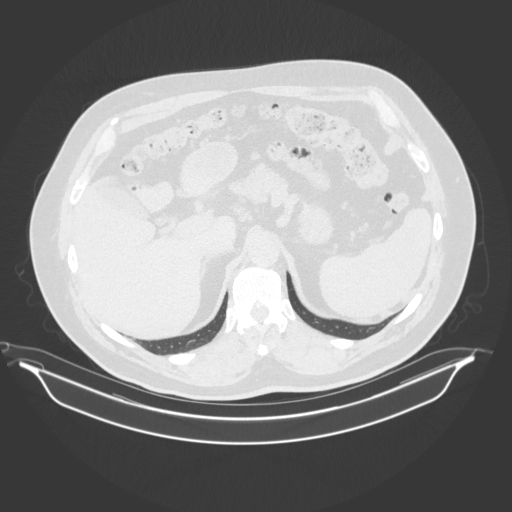
[im 62/208  lung]
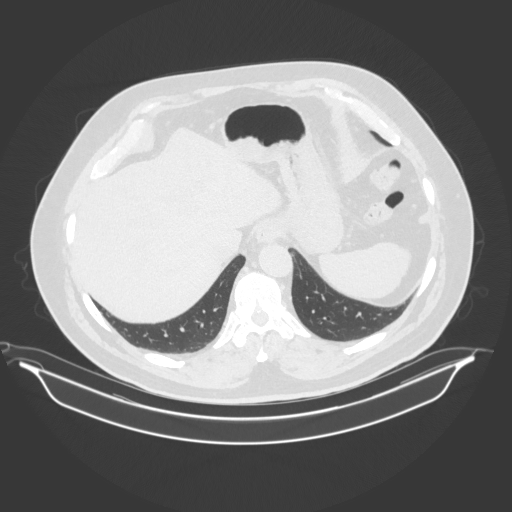
[im 77/208  mediastinal]
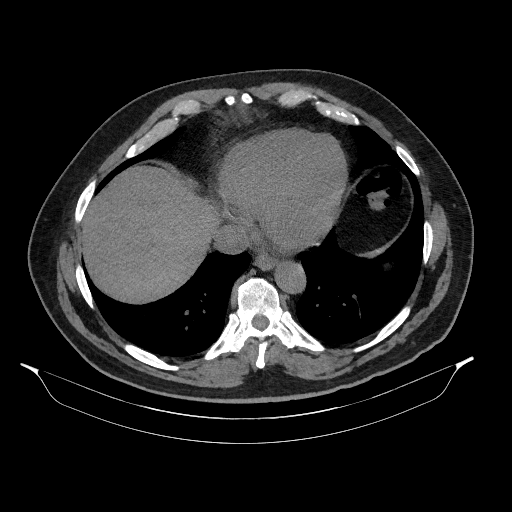
[im 77/208  lung]
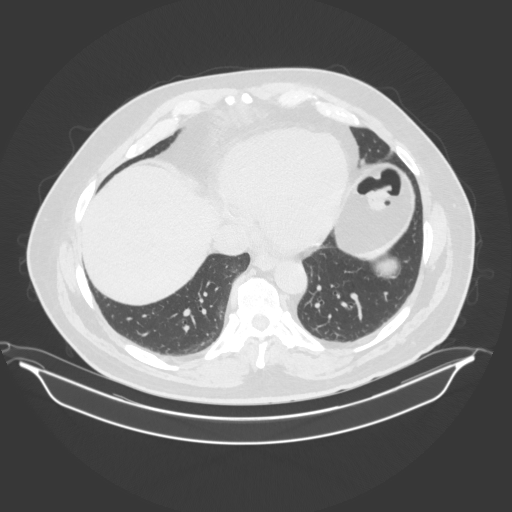
[im 93/208  lung]
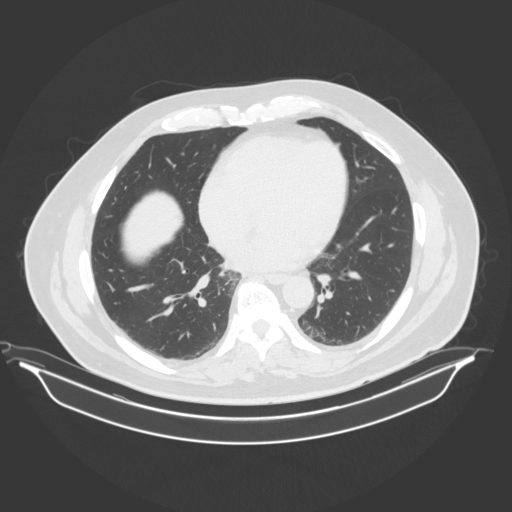
[im 116/208  lung]
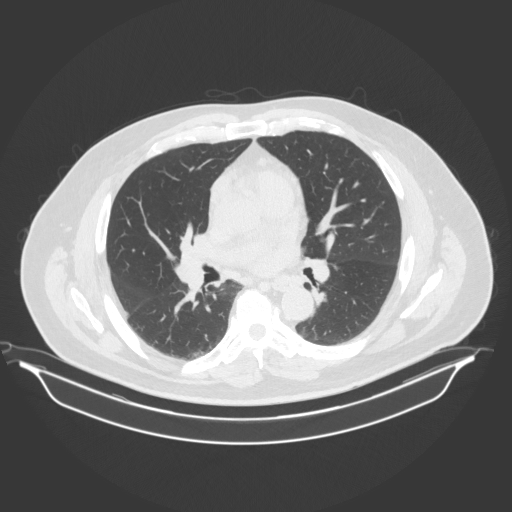
[im 131/208  lung]
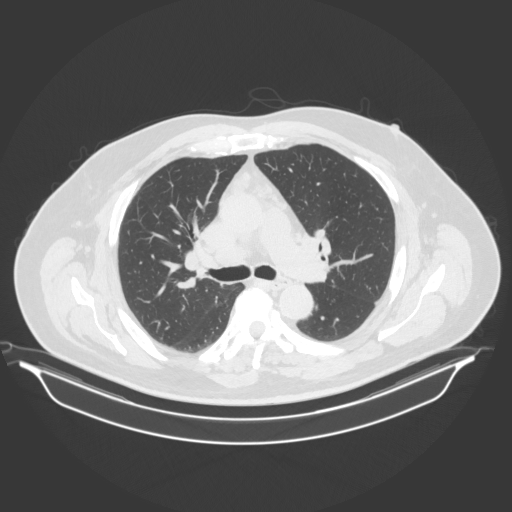
[im 146/208  mediastinal]
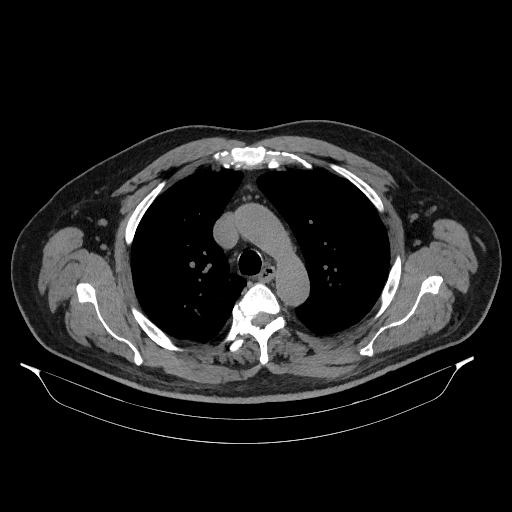
[im 146/208  lung]
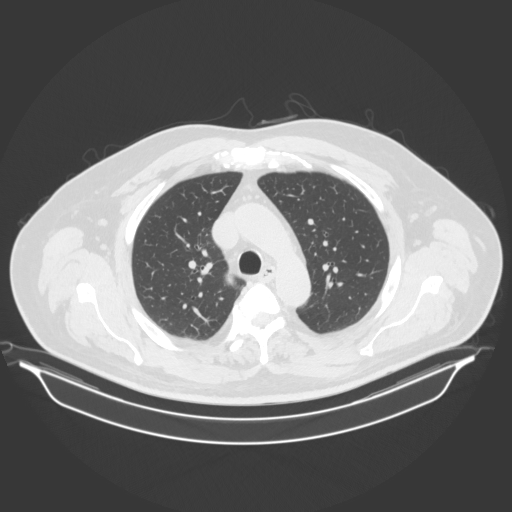
[im 162/208  lung]
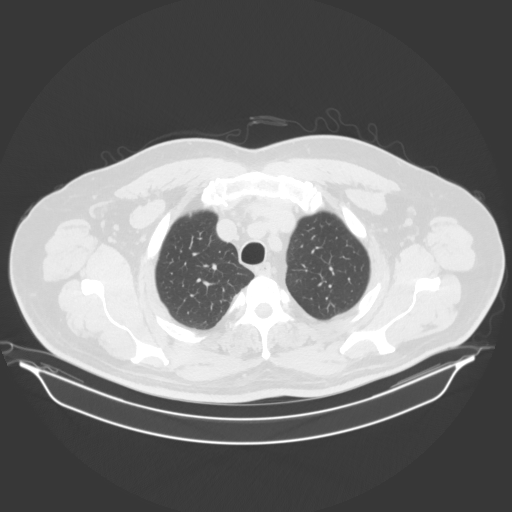
[im 177/208  lung]
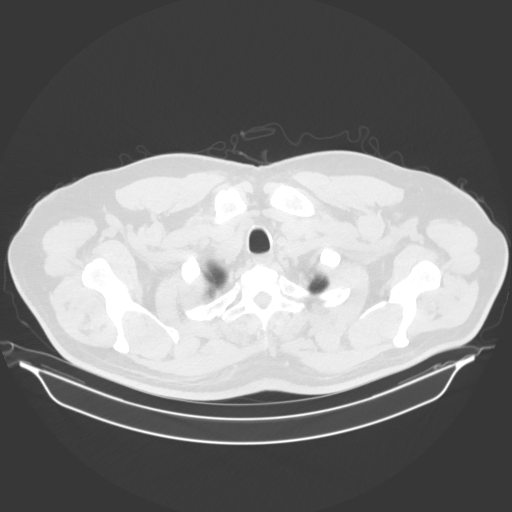
[im 192/208  lung]
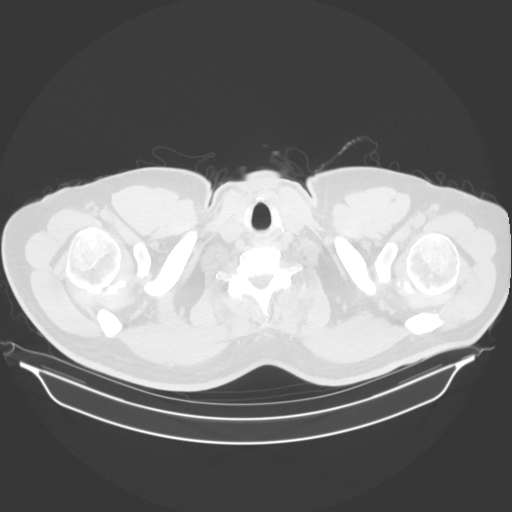

[Series 5: coronal · coronal · 0.71mm/px · 3 of 151 slices shown]
[im 31/151  lung]
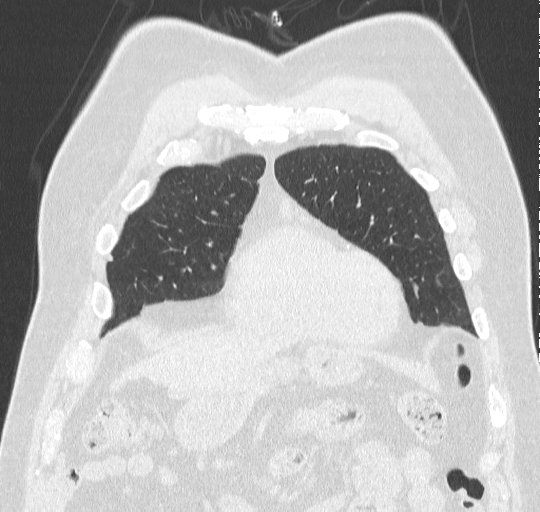
[im 61/151  lung]
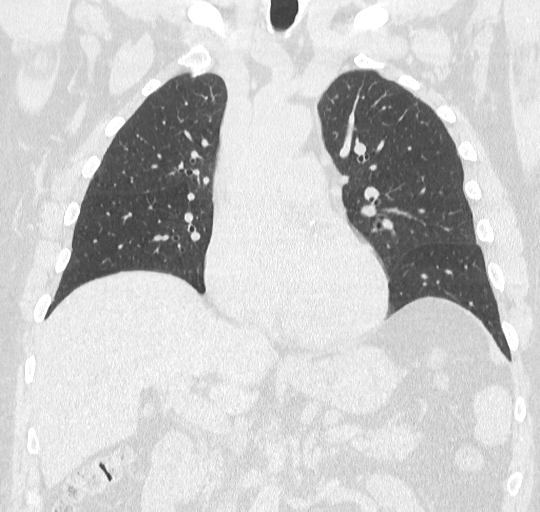
[im 91/151  lung]
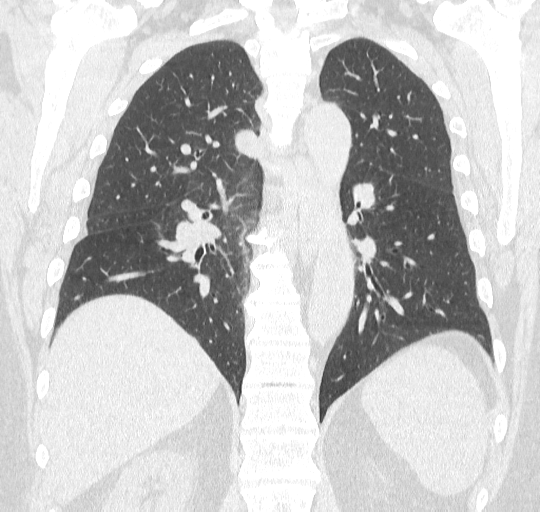

[15 of 36 positions shown; findings below may reference images not displayed]

FINDINGS: By direct comparison in the prior study showed a 4 mm right upper lobe 
nodule currently measuring no greater than 3.5 mm. Prior study showed a 4 mm 
left upper lobe nodule which is unchanged. There is focal atelectasis in the 
posterior left upper lobe. There is a 2 mm right upper lobe nodule, image 91, 
unchanged. At the periphery of the right middle lobe there is a 4.5 mm nodule, 
unchanged. At the medial left lung base a smoothly margined 8.5 mm nodule is 
stable. 
No new nodule. No effusion or infiltrate. Thyroid lobes are quite small or has 
been surgically removed. There is increased density in the anterior mediastinum, 
including nodular components, the largest measuring 14 x 13 mm, image 90, 
unchanged from the prior study. There are small central mediastinal lymph nodes. 
Subcarinal lymph node measures 14 x 12 mm. There are nonenlarged axillary lymph 
nodes. 
The heart is not enlarged. There are coronary artery calcifications and possibly 
coronary artery stent. There is no adrenal nodule. Visualized liver and spleen 
are unremarkable. There is a small hiatal hernia. There are moderate thoracic 
spine degenerative changes.
IMPRESSION: Stable pulmonary nodules, largest 9 mm at the left base. There is increased 
nodular soft tissue in the mediastinum, appearing stable compared with December 19, 2018 study. While this possibly represents thymic hyperplasia, thymoma or 
other neoplasm cannot be excluded. There is a mildly enlarged subcarinal lymph 
node. PET/CT would be useful further evaluation 
RADIATION DOSE REDUCTION: All CT scans are performed using radiation dose 
reduction techniques, when applicable.  Technical factors are evaluated and 
adjusted to ensure appropriate moderation of exposure.  Automated dose 
management technology is applied to adjust the radiation doses to minimize 
exposure while achieving diagnostic quality images.

## 2019-09-20 IMAGING — DX FOOT 3 VIEWS RIGHT
1 series · 3 of 3 positions shown · non-contrast
Comparison: none

FOOT 3 VIEWS RIGHT, 09/20/2019 [DATE]: 
CLINICAL INDICATION:  Diabetic. Pain walking. History of thyroid cancer. 
COMPARISON EXAMINATIONS: None prior.

[Series 1: AP · U · 0.14mm/px · 3 of 3 slices shown]
[im 1/3]
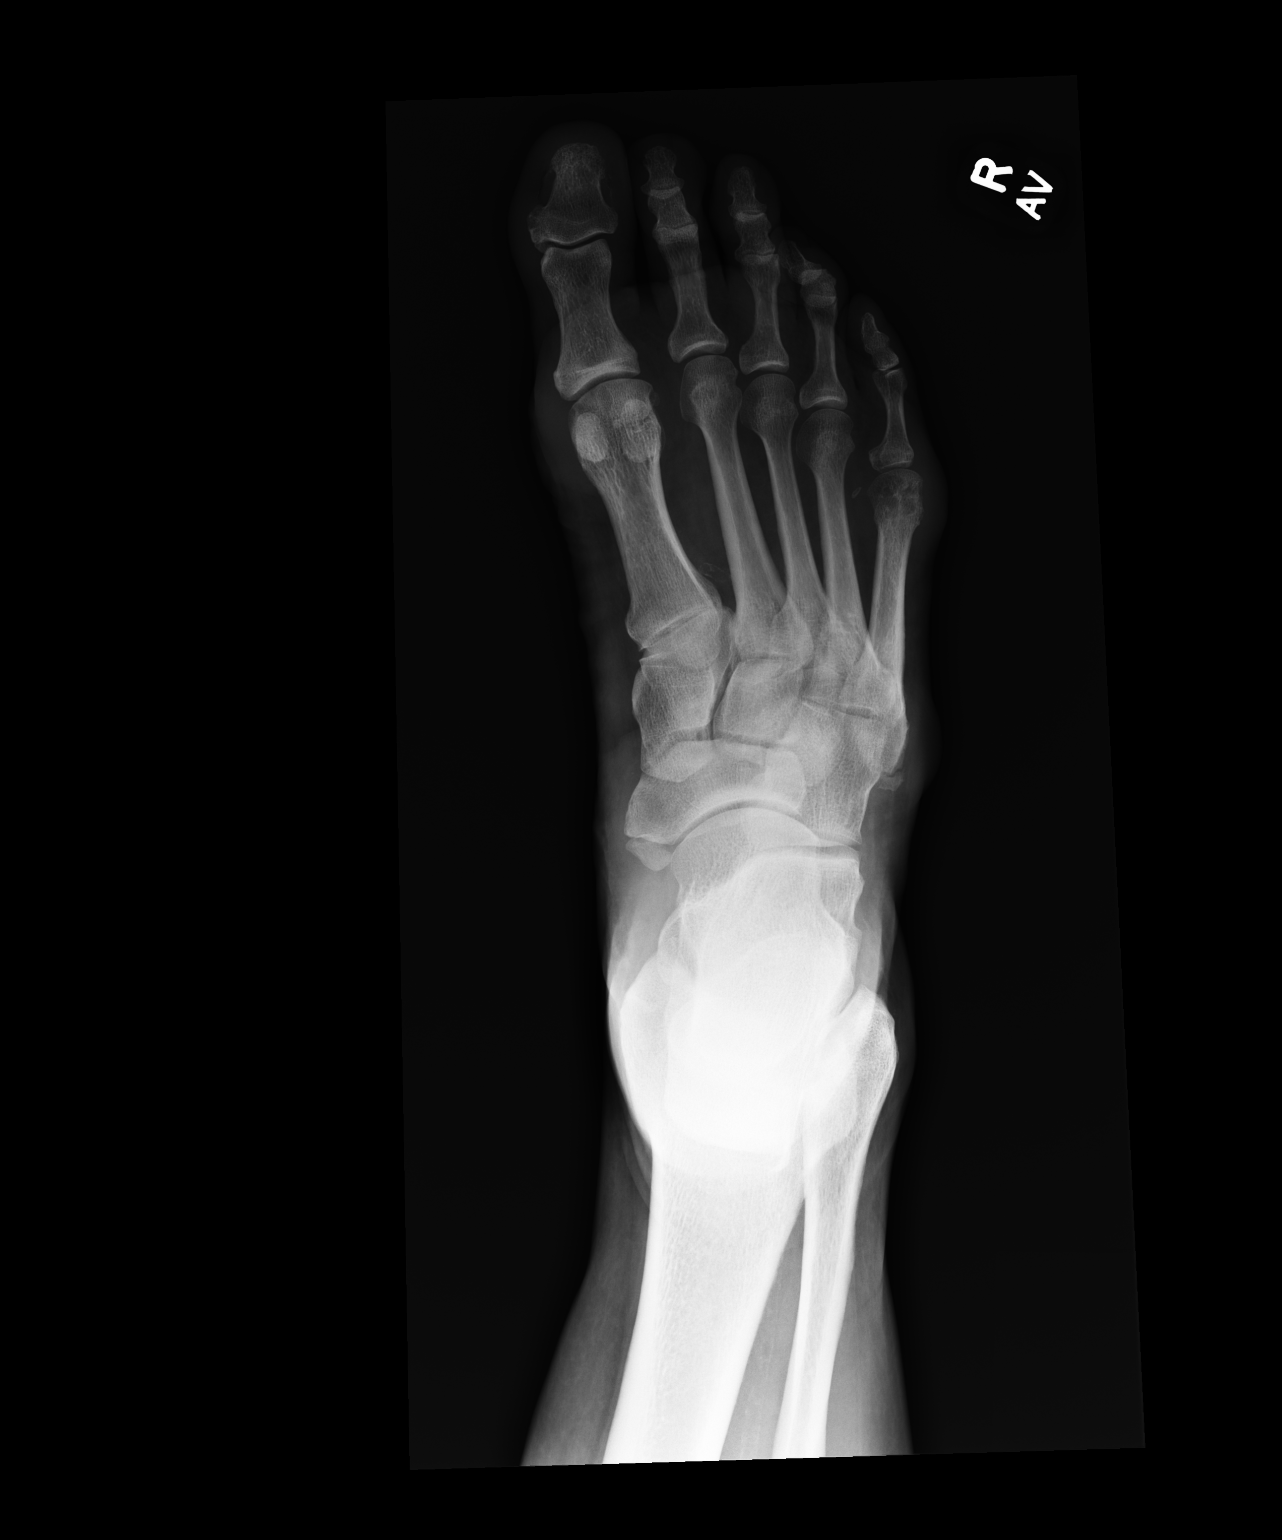
[im 2/3]
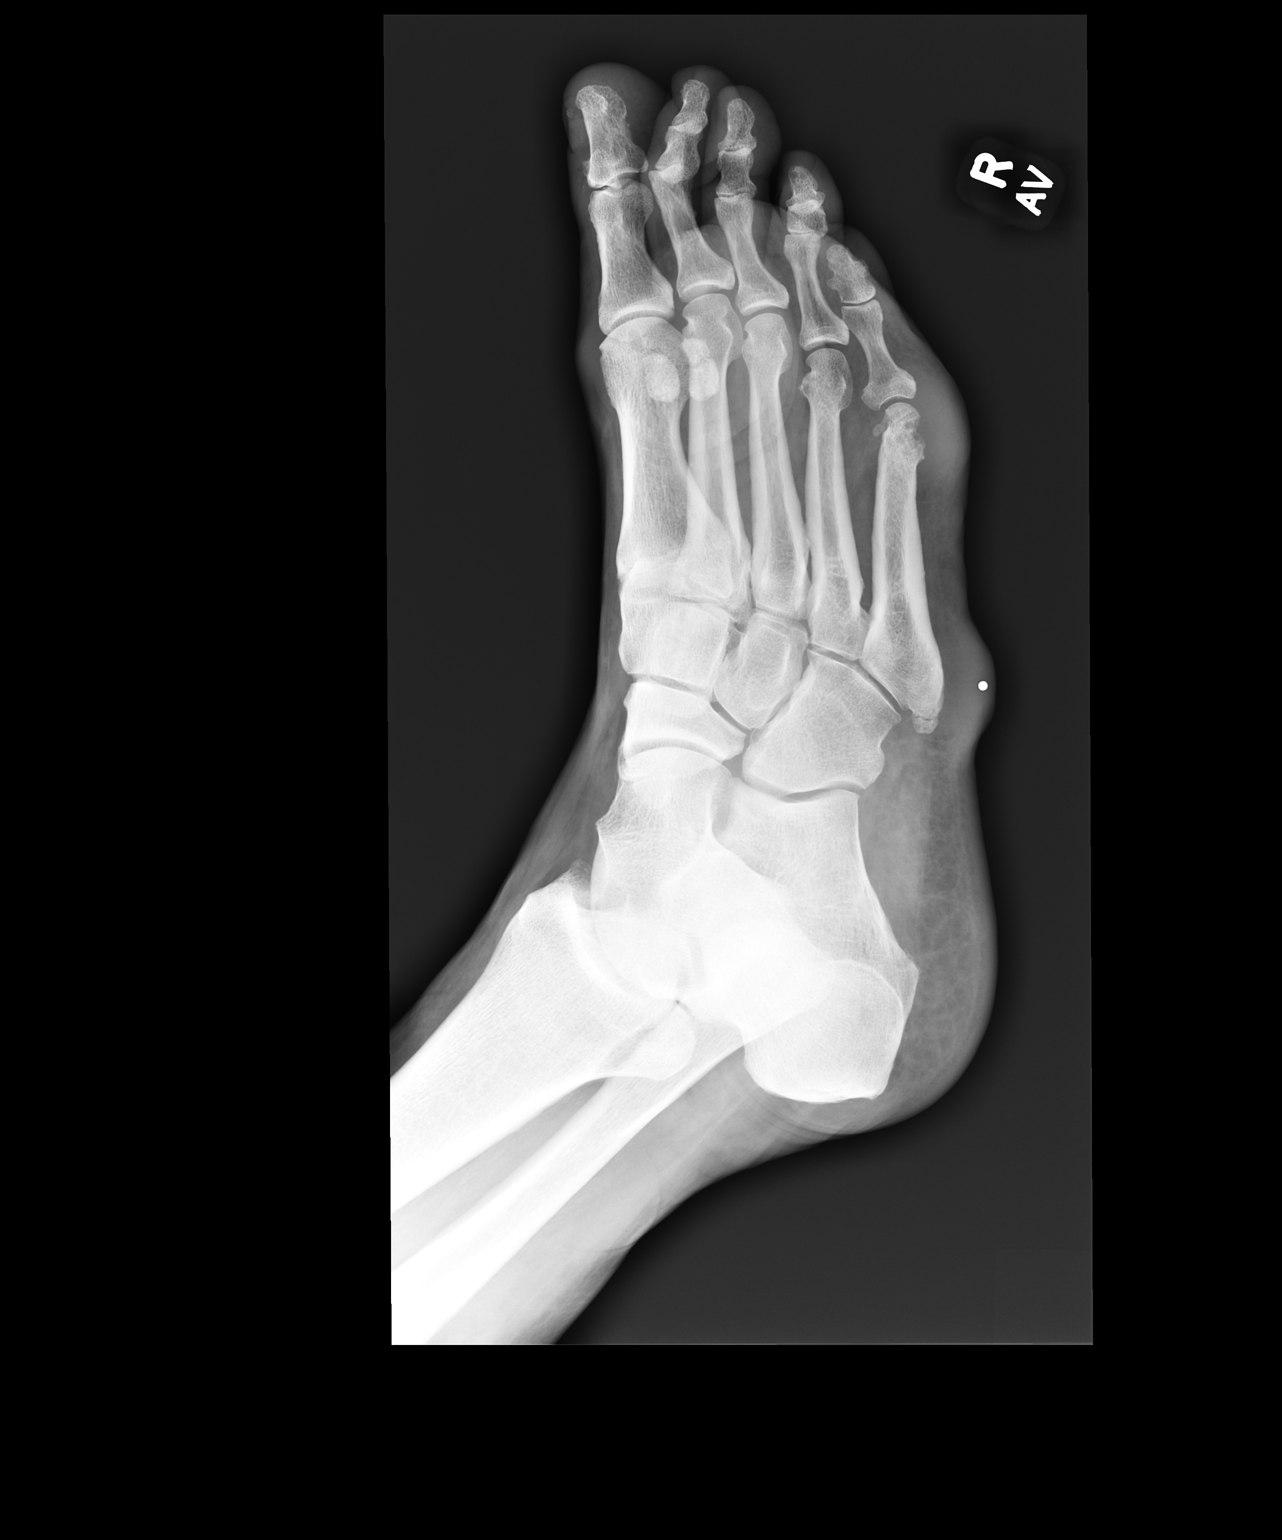
[im 3/3]
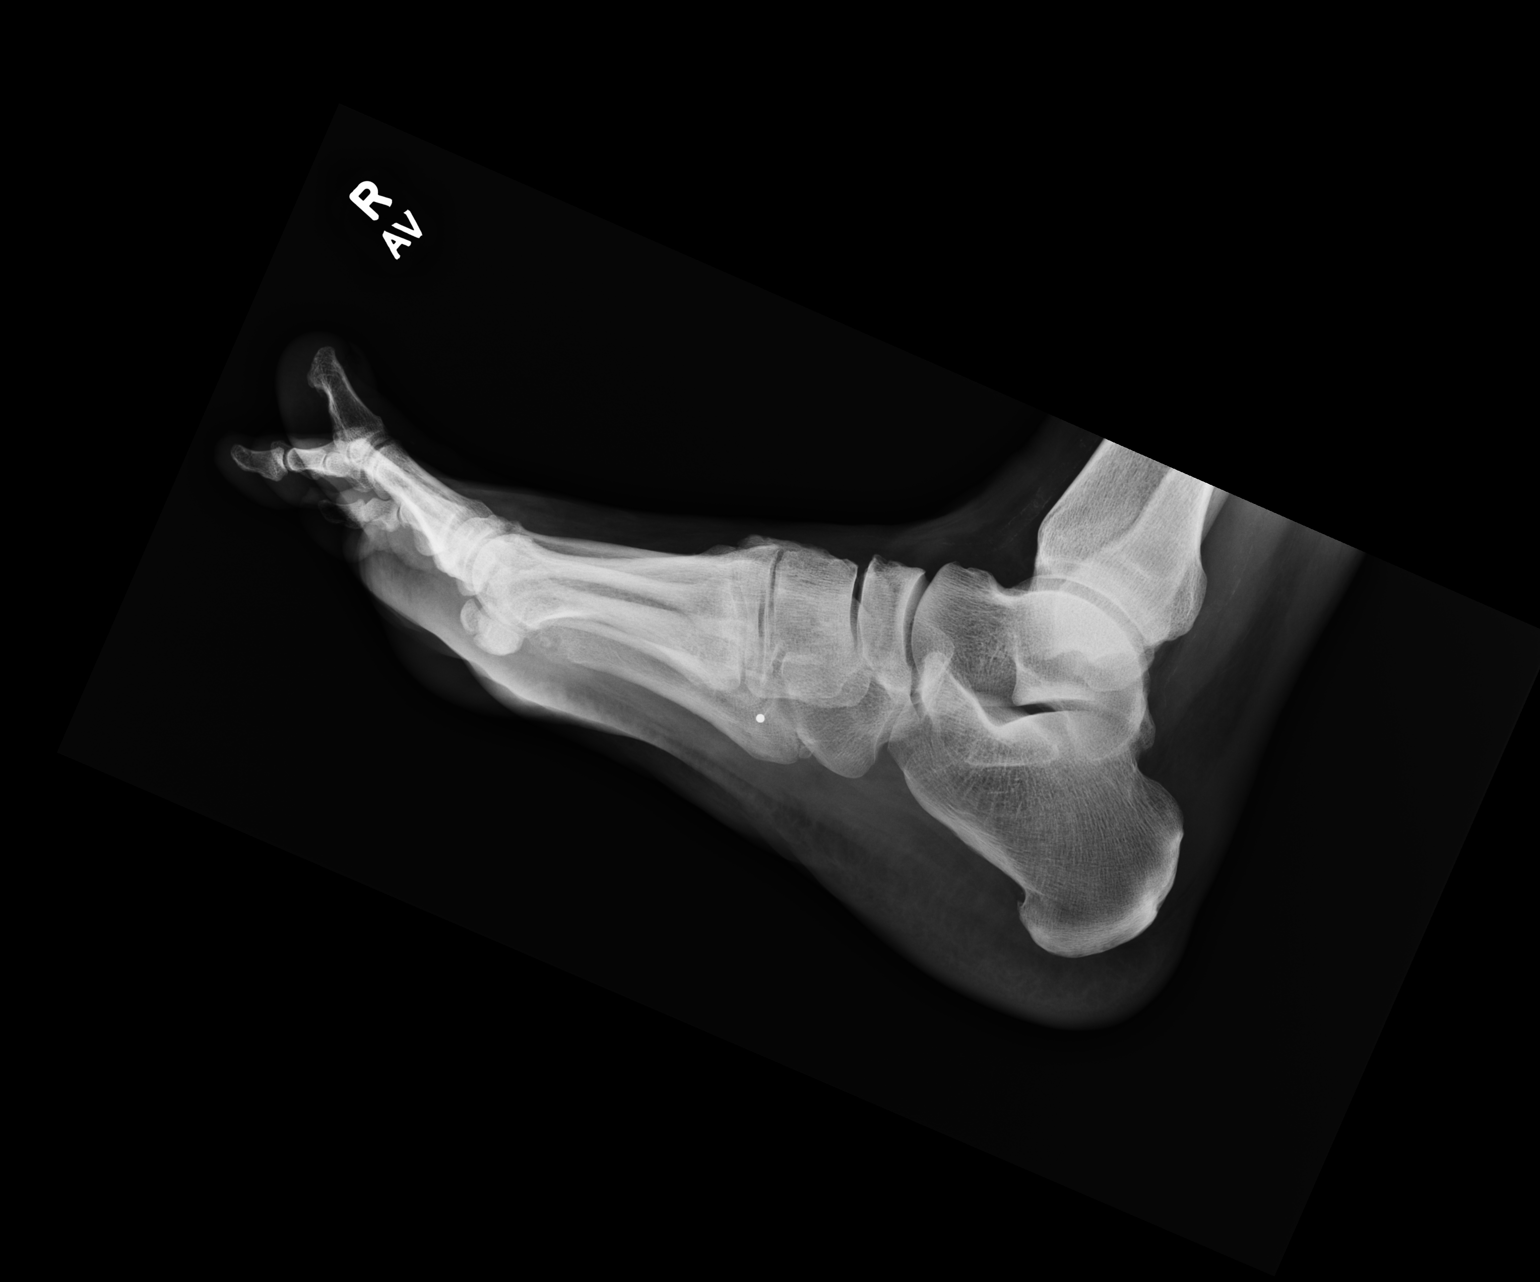

[3 of 3 positions shown; findings below may reference images not displayed]

FINDINGS: There is erosive change of the fifth metatarsal head with overlying 
soft tissue swelling. Joint spaces are relatively well preserved. Bipartite 
lateral sesamoid. Old ununited fracture fragment or osteophyte of the base of 
the fifth metatarsal. There is prominent soft tissue swelling about the 
peripheral margin of the base of the fifth metatarsal. Accessory navicular bone.
IMPRESSION: Nonspecific appearance of the right foot, however an inflammatory arthritides 
should be excluded such as rheumatoid arthritis. Recommend clinical and 
laboratory correlation.

## 2019-09-20 IMAGING — DX FOOT 3 VIEWS LEFT
1 series · 3 of 3 positions shown · non-contrast
Comparison: none

FOOT 3 VIEWS LEFT, 09/20/2019 [DATE]: 
CLINICAL INDICATION:  Diabetic. Pain walking. History of thyroid cancer. 
COMPARISON EXAMINATIONS: None prior.

[Series 1: AP · U · 0.14mm/px · 3 of 3 slices shown]
[im 1/3]
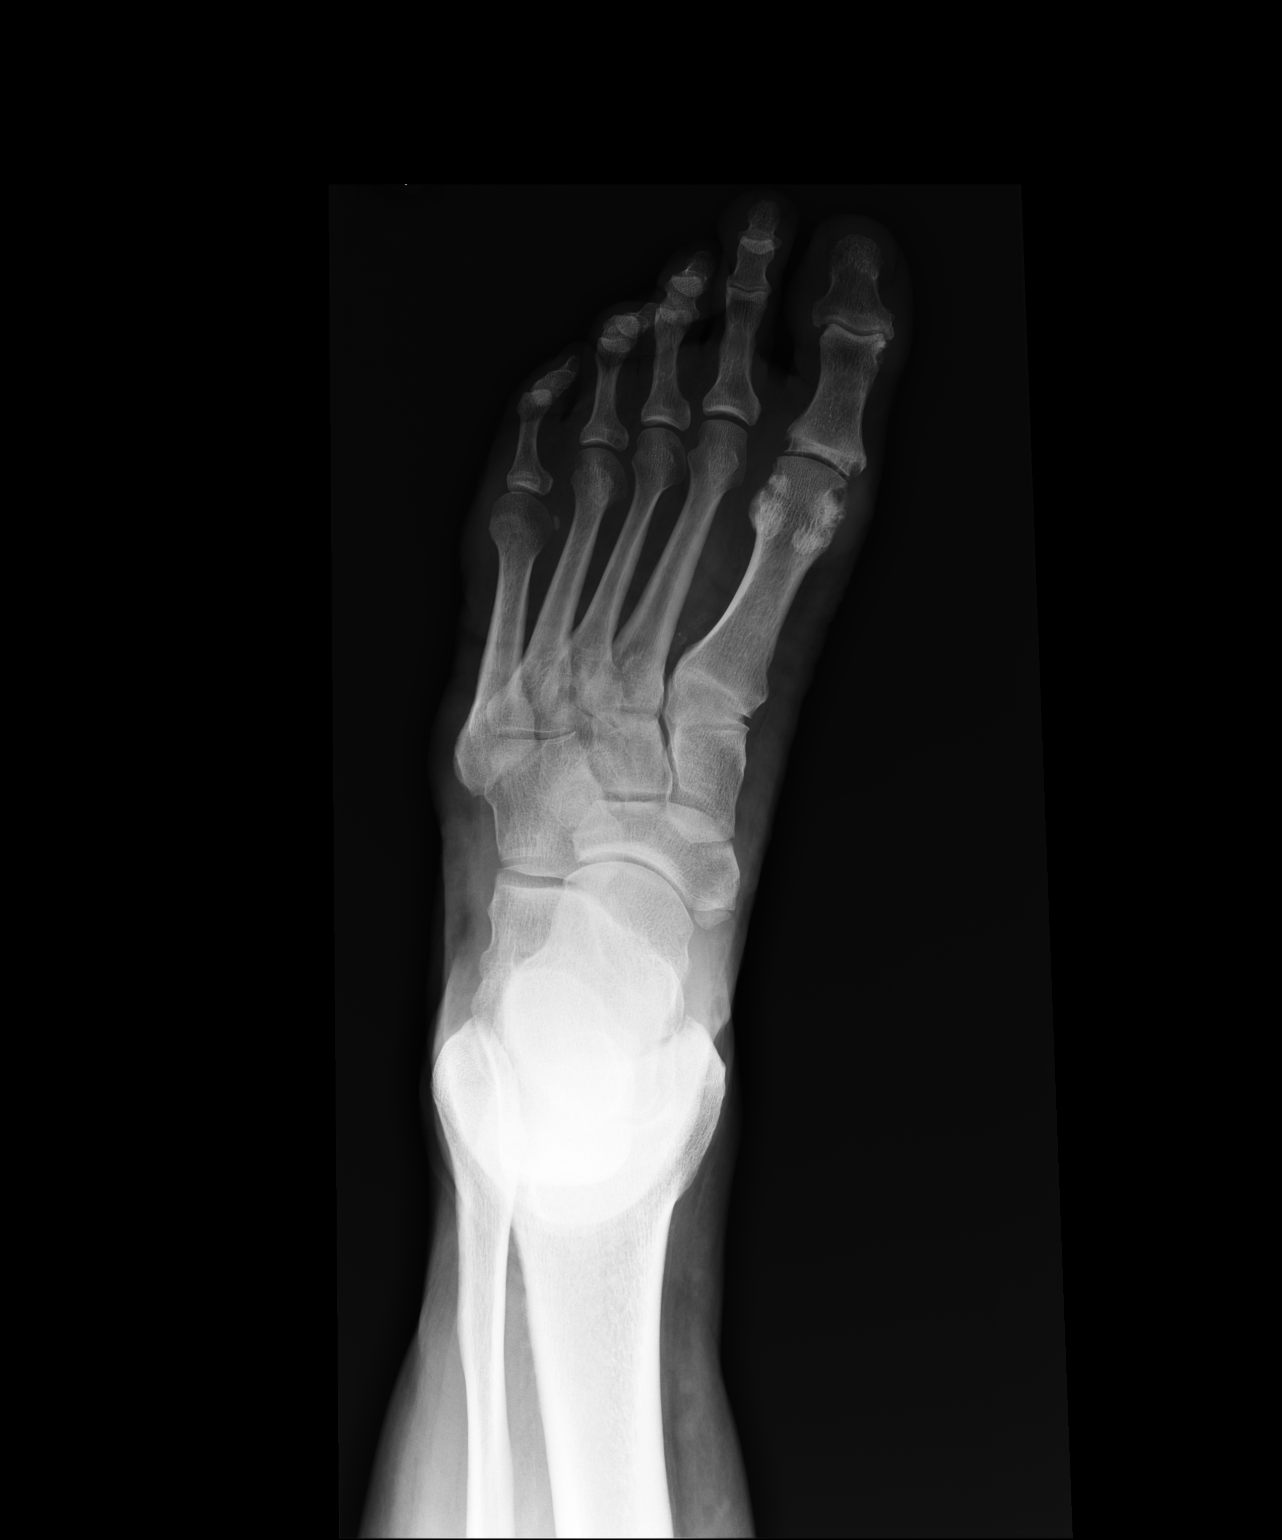
[im 2/3]
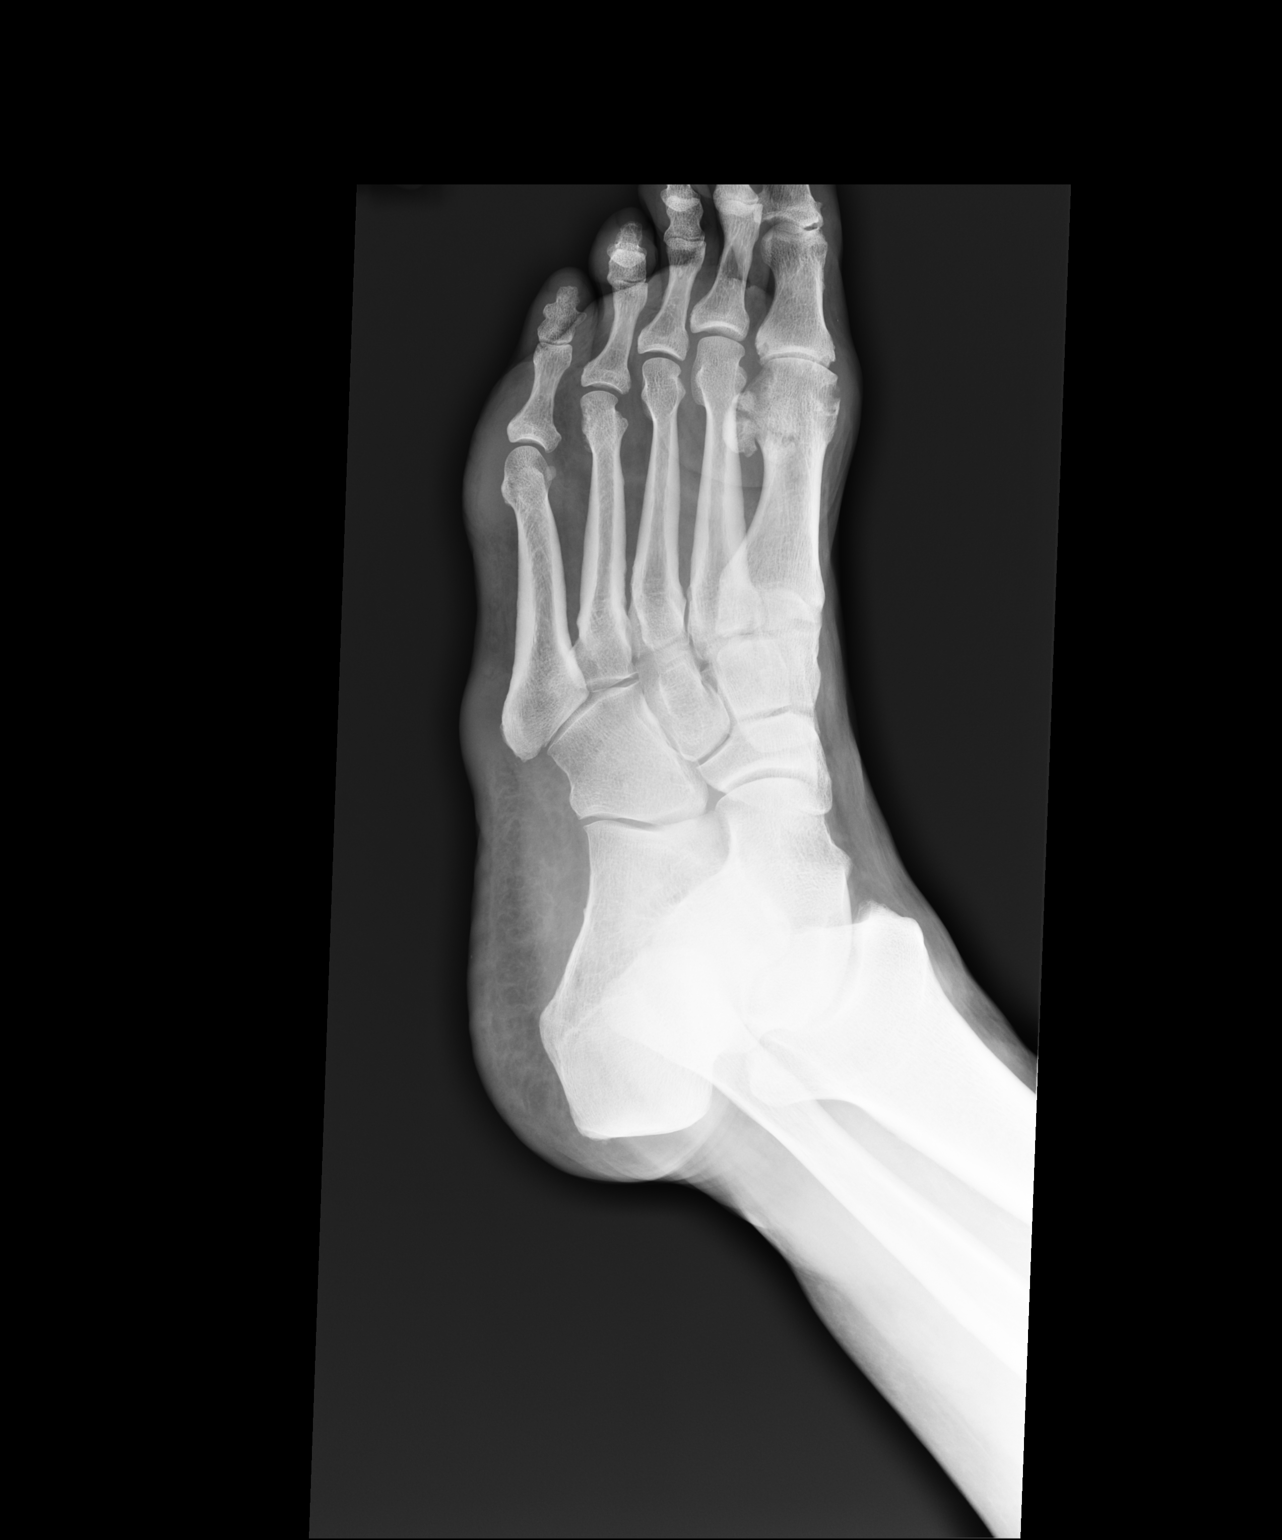
[im 3/3]
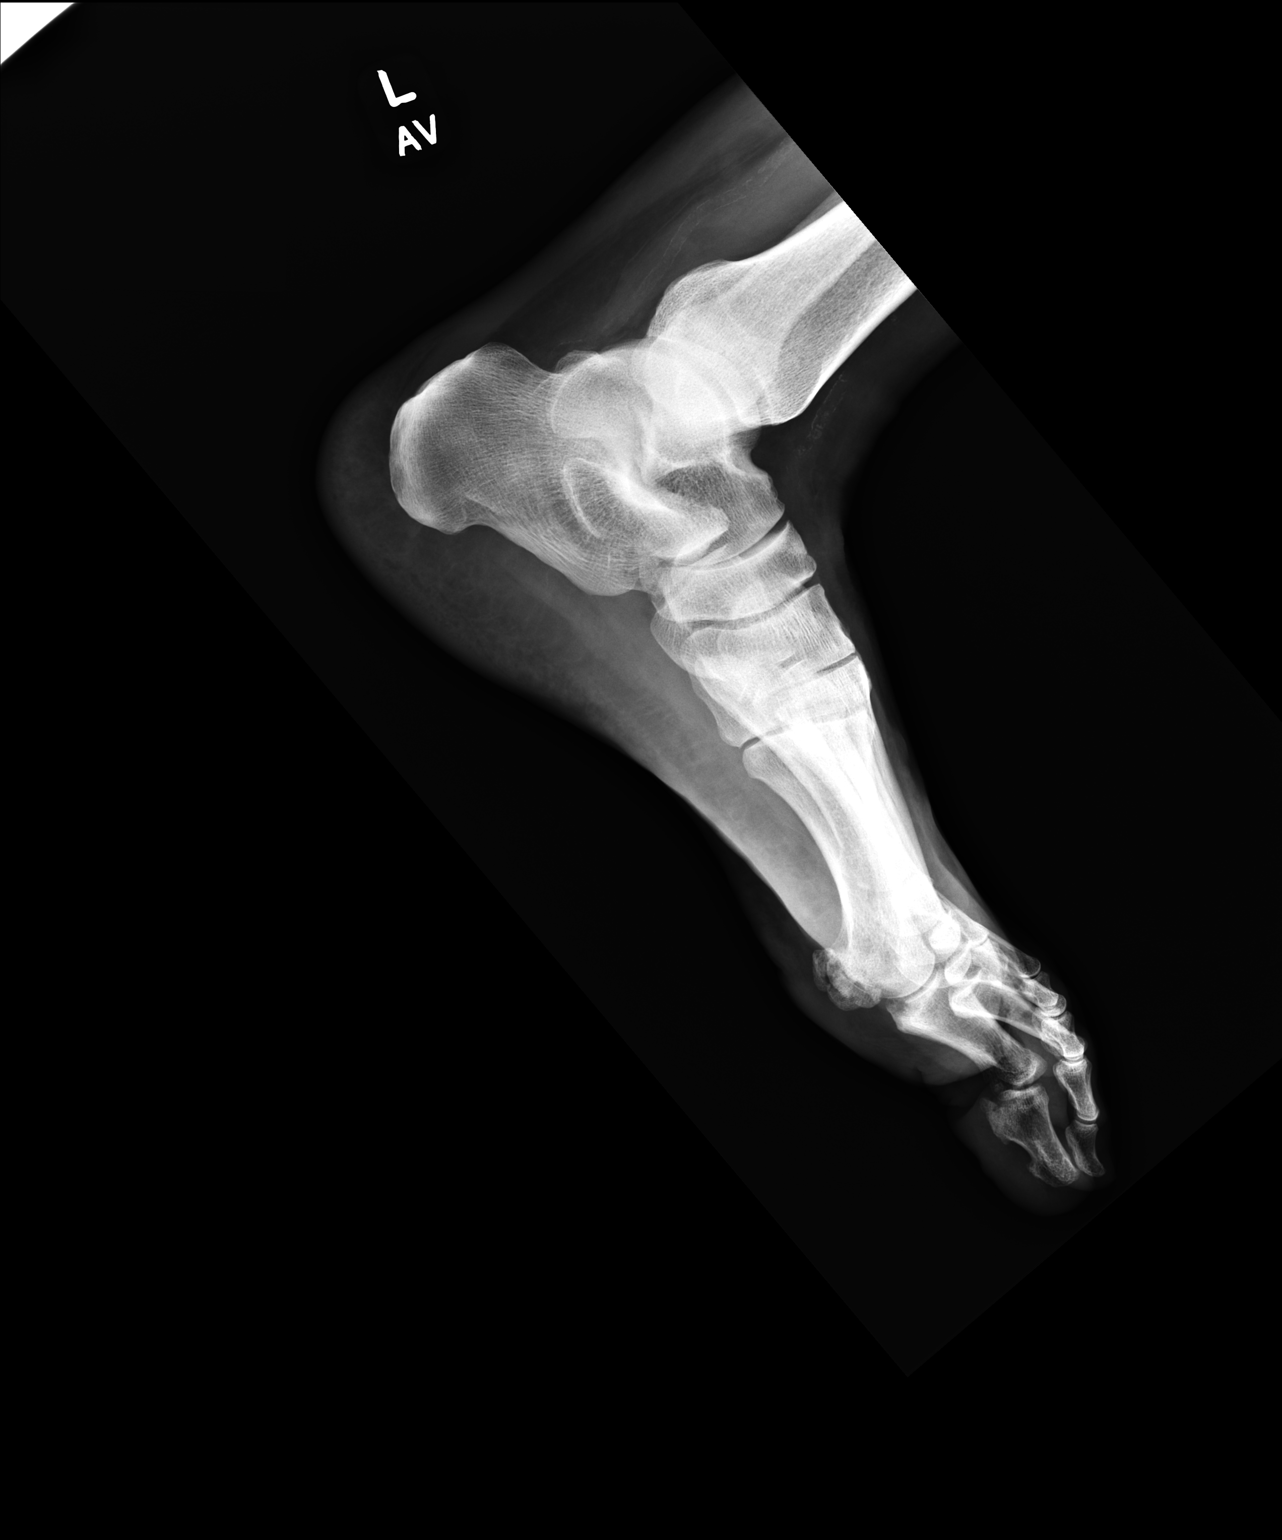

[3 of 3 positions shown; findings below may reference images not displayed]

FINDINGS: There is erosive change of the margins of the first metatarsal head 
and base of the first proximal phalanx. Mild soft tissue swelling. Fifth 
metatarsal is preserved. Joint spaces are relatively well preserved. Bipartite 
lateral sesamoid. No fracture. Normal alignment. Accessory navicular bone.
IMPRESSION: Nonspecific appearance of the left foot, however an inflammatory arthritides 
should be excluded. Recommend clinical and laboratory correlation.

## 2019-09-25 ENCOUNTER — Encounter (HOSPITAL_BASED_OUTPATIENT_CLINIC_OR_DEPARTMENT_OTHER): Payer: Self-pay | Admitting: Family Medicine

## 2019-10-01 NOTE — Nursing Note (Signed)
Orders for [employer] [facility] faxed to [phone]. Confirmation received 09-25-19 @ 9:20 am.  Cindee Salt, LPN

## 2019-10-02 IMAGING — PT PET CT SCAN TUMOR IMAGING SKULL TO THIGH
6 series · 25 of 25 positions shown · non-contrast
Comparison: none

PET CT SCAN TUMOR IMAGING SKULL TO THIGH, 10/02/2019 [DATE]: 
CLINICAL INDICATION:  Evaluate mediastinal soft tissue, subcarinal lymph node, 
left lower lobe nodule 
COMPARIS CT chest September 20, 2019 ON:
TECHNIQUE: A dose of 14.0 millicuries of 18-FDG was administered intravenously 
and skull to thigh PET scanning was performed at 70 minutes. Tomographic scans 
were reconstructed in axial, coronal, and sagittal projections. The data was 
reconstructed into a three-dimensional volume rendered images and reviewed in a 
rotational cine loop. Serum blood glucose at the time of injection was 103 
mg/dl.

[Series 201: body-low dose ct, idose (4) · axial · 4.0mm · 1.17mm/px · z∈[-1079,-63]mm · 6 of 255 slices shown]
[im 1/255]
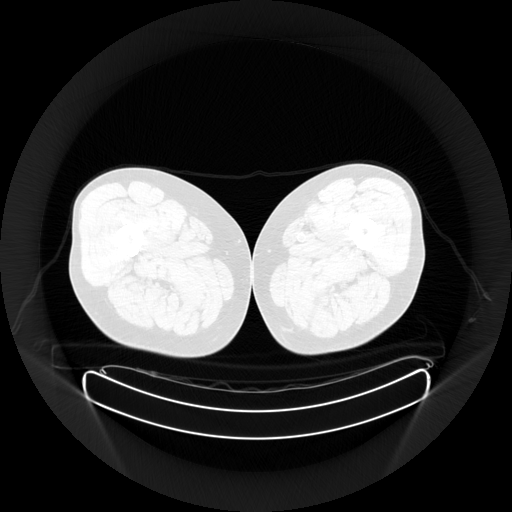
[im 51/255]
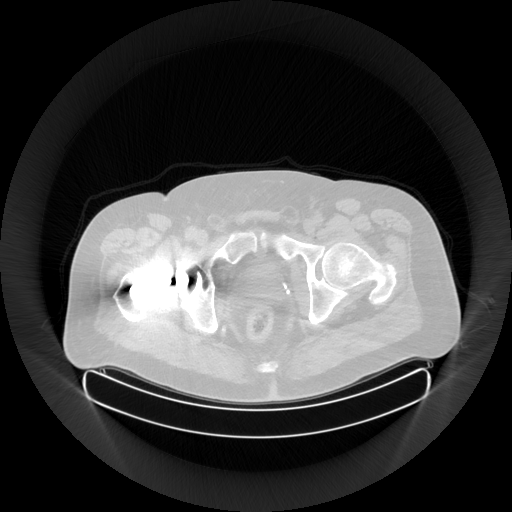
[im 102/255]
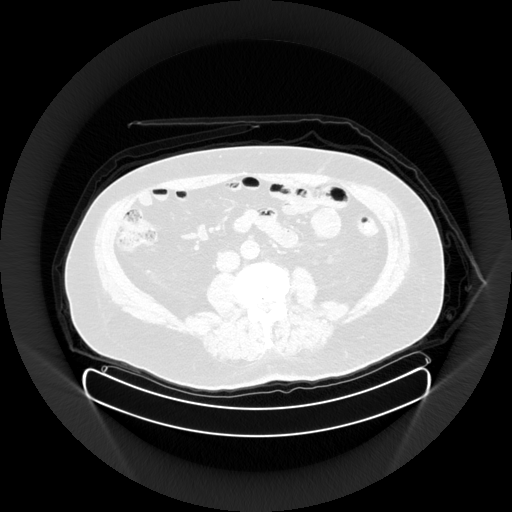
[im 153/255]
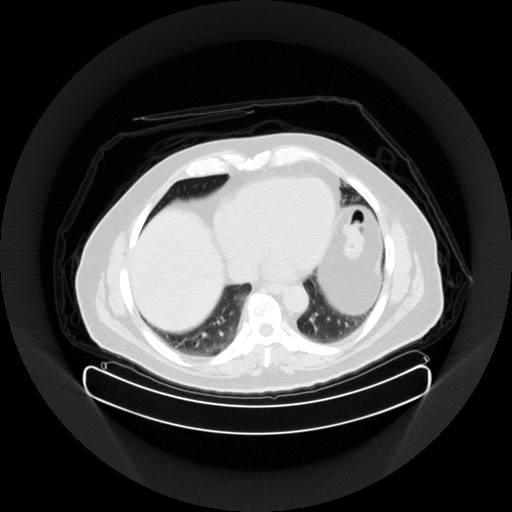
[im 204/255]
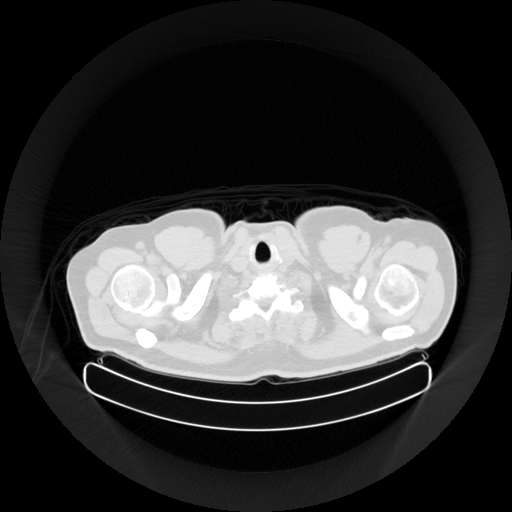
[im 255/255]
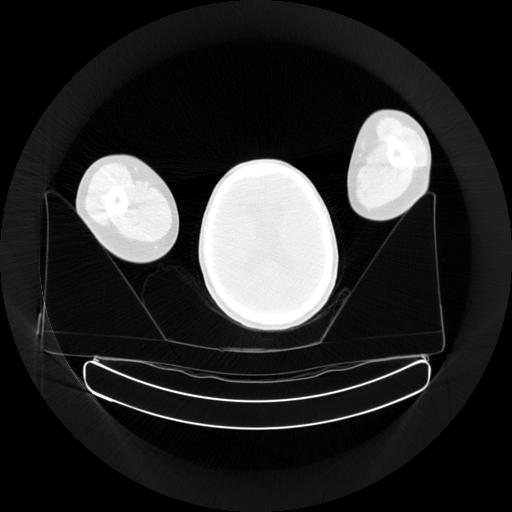

[Series 202: lung ct, idose (2) · axial · 2.0mm · 1.17mm/px · z∈[-566,-220]mm · 3 of 174 slices shown]
[im 1/174]
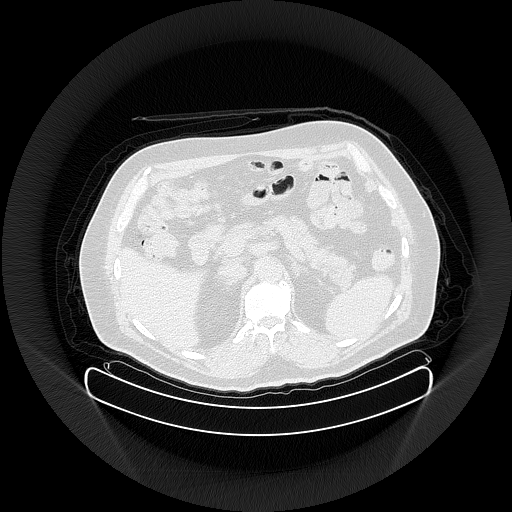
[im 87/174]
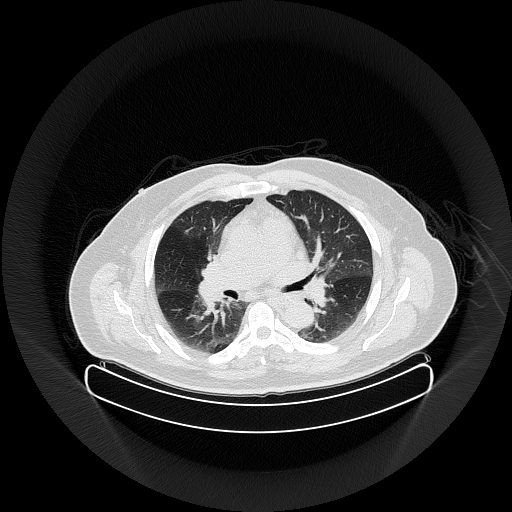
[im 174/174]
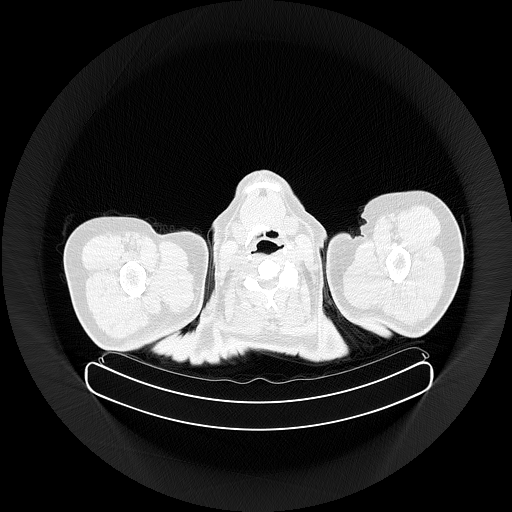

[Series 4178: (wb_nac) body · axial · 4.0mm · 4.00mm/px · z∈[-1079,-63]mm · 5 of 255 slices shown]
[im 1/255]
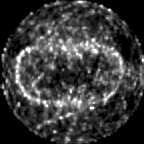
[im 64/255]
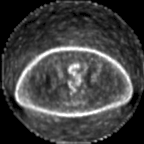
[im 128/255]
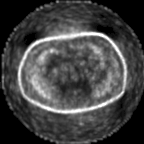
[im 191/255]
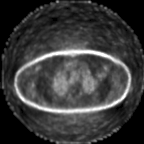
[im 255/255]
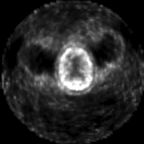

[Series 4180: (wb_ctac) body · axial · 4.0mm · 4.00mm/px · z∈[-1079,-63]mm · 5 of 255 slices shown]
[im 1/255]
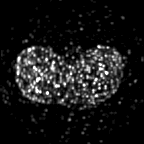
[im 64/255]
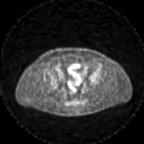
[im 128/255]
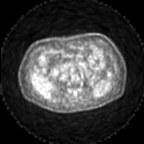
[im 191/255]
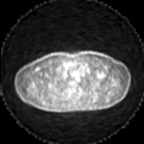
[im 255/255]
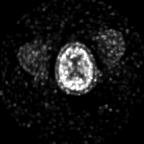

[Series 4182: (detailwb_nac_psf) body · axial · 2.0mm · 2.00mm/px · z∈[-566,-220]mm · 3 of 174 slices shown]
[im 1/174]
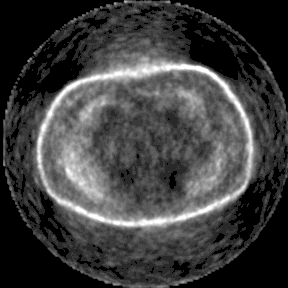
[im 87/174]
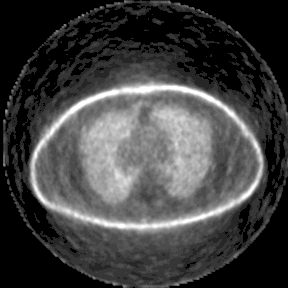
[im 174/174]
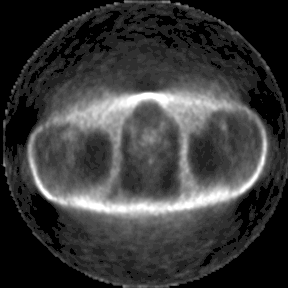

[Series 4183: (detailwb_ctac_psf) body · axial · 2.0mm · 2.00mm/px · z∈[-566,-220]mm · 3 of 174 slices shown]
[im 1/174]
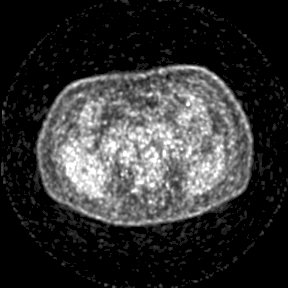
[im 87/174]
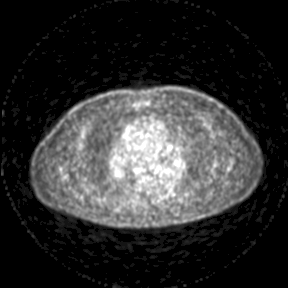
[im 174/174]
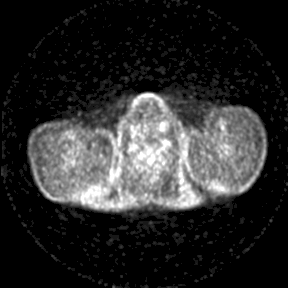

[25 of 25 positions shown; findings below may reference images not displayed]

FINDINGS: Again evident is nodular tissue in the anterior mediastinum. This 
shows FDG activity, with maximum SUV approximately 3.1-3.5. There is also FDG 
uptake in the pulmonary hila, maximum SUV on the left 5.3, on the right 3.2. The 
subcarinal lymph node shows maximum SUV 4.6. There is mild activity within a 
left upper axillary lymph node along the lateral margin of the pectoralis 
muscle, maximum SUV 3.9. Below this and axillary node measures 5 mm short axis, 
with maximum SUV 5.0. There is mild activity in nonenlarged right axillary 
nodes. 
The left lower lobe nodule is best seen on CT image 93, with maximum SUV 2.3. 
No hepatic or other concerning infradiaphragmatic activity. There is uptake 
along the left hip abductor cuff. There is a right hip prosthesis. There are 
degenerative changes in the spine, but no evidence for osseous neoplastic 
involvement. 
Cervical node bearing regions show no abnormal uptake.
IMPRESSION: There is FDG activity within the nodular soft tissue in the anterior 
mediastinum. Thymoma or lymphoproliferative disease cannot be excluded. There is 
also FDG uptake within the pulmonary hila and subcarinal lymph nodes, which 
could be associated with lymphoproliferative disease or metastatic involvement. 
There is FDG activity within 2 nonenlarged left axillary lymph nodes. This 
uptake is nonspecific; correlation with recent vaccination history advised. 
Nonenlarged Low-level activity in the 9 mm left lower lobe nodule. Further 
surveillance follow-up advised. This is not a likely source of primary 
malignancy.

## 2019-10-03 ENCOUNTER — Encounter (HOSPITAL_BASED_OUTPATIENT_CLINIC_OR_DEPARTMENT_OTHER): Payer: Self-pay | Admitting: Family Medicine

## 2019-10-03 NOTE — Nursing Note (Signed)
Prescription for Diabetic Supplies faxed to [facility] [phone]). Confirmation received 10-03-19 @ 8:21 am.  Cindee Salt, LPN

## 2019-10-04 ENCOUNTER — Ambulatory Visit (HOSPITAL_BASED_OUTPATIENT_CLINIC_OR_DEPARTMENT_OTHER): Payer: Self-pay | Admitting: Rheumatology

## 2019-10-04 ENCOUNTER — Ambulatory Visit (HOSPITAL_BASED_OUTPATIENT_CLINIC_OR_DEPARTMENT_OTHER): Payer: Self-pay | Admitting: Family Medicine

## 2019-10-04 NOTE — Telephone Encounter (Signed)
Demographics sheet and DWO form with 4 times daily testing order faxed to [place] at [facility] [phone]  Confirmation rec'd  Sanjuana Letters, MA

## 2019-10-04 NOTE — Telephone Encounter (Signed)
-----   Message from Katina Degree, RN sent at 10/03/2019  3:14 PM EDT -----  Regarding: FW: [place] Pt // Fax request  ----- Message from [place] sent at 10/03/2019 11:07 AM EDT -----  [name], MD    Lauren from [place] calling in stating that on the order for the [facility] system, they need the [employer] and frequency of testing. This can be faxed to [phone]       Thanks,  [employer]

## 2019-11-12 ENCOUNTER — Encounter (HOSPITAL_BASED_OUTPATIENT_CLINIC_OR_DEPARTMENT_OTHER): Payer: Self-pay | Admitting: Family Medicine

## 2020-03-31 IMAGING — PT PET CT SCAN TUMOR IMAGING SKULL TO THIGH  ([facility])
3 series · 25 of 25 positions shown · non-contrast
Comparison: PET scan 10/02/2019 and CT studies dated 09/20/2019, 05/20/2000

PET CT SCAN TUMOR IMAGING SKULL TO THIGH  ([facility]), 03/31/2020 [DATE]: 
(Films were taken at [facility] on 03/31/2020 and interpreted at 
[facility] on 03/31/2020. 
CLINICAL INDICATION:  Pulmonary nodule and enlarged lymph nodes
TECHNIQUE: A dose of 16.5 millicuries of 18-FDG was administered intravenously 
and skull to thigh PET scanning was performed at 49 minutes. Tomographic scans 
were reconstructed in axial, coronal, and sagittal projections. The data was 
reconstructed into a three-dimensional volume rendered image and reviewed in a 
rotational cine loop. Serum blood glucose at the time of injection was 97 mg/dl.

[Series 3: pet ac · axial · 4.0mm · 4.07mm/px · z∈[-972,-8]mm · 9 of 242 slices shown]
[im 1/242]
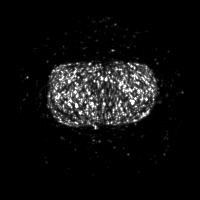
[im 31/242]
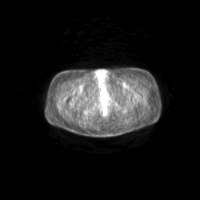
[im 61/242]
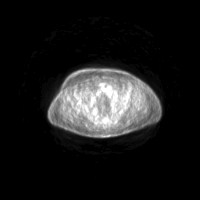
[im 91/242]
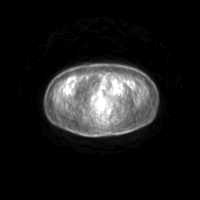
[im 121/242]
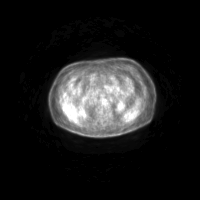
[im 151/242]
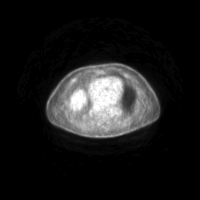
[im 181/242]
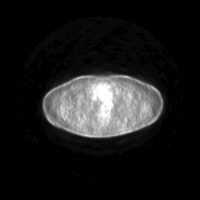
[im 211/242]
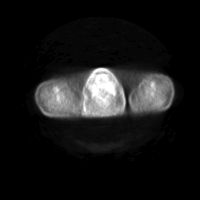
[im 242/242]
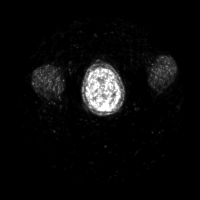

[Series 4: ct wb · axial · 4.0mm · 0.98mm/px · z∈[-972,-8]mm · 8 of 242 slices shown]
[im 1/242]
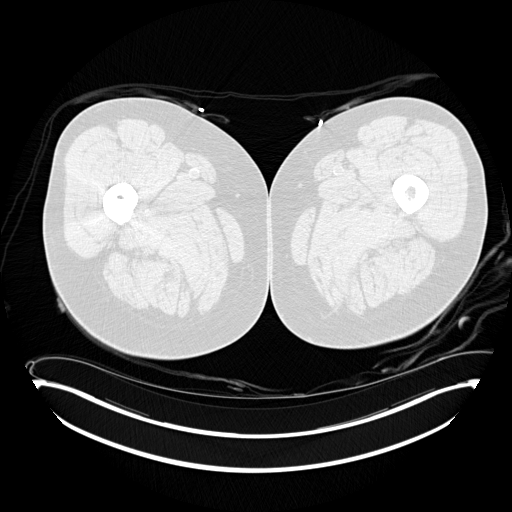
[im 35/242]
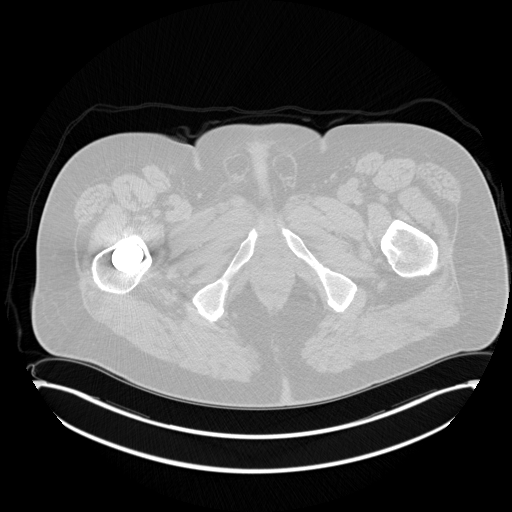
[im 69/242]
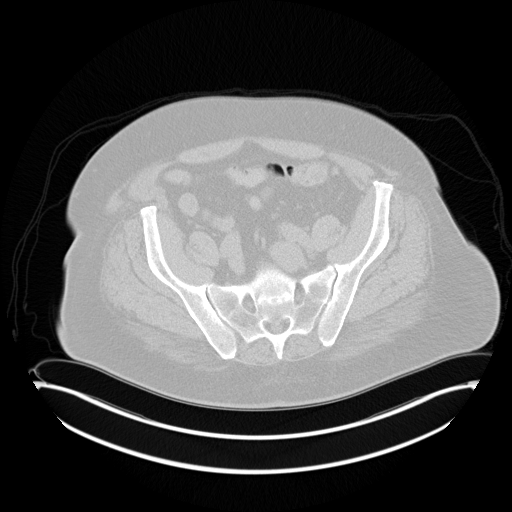
[im 104/242]
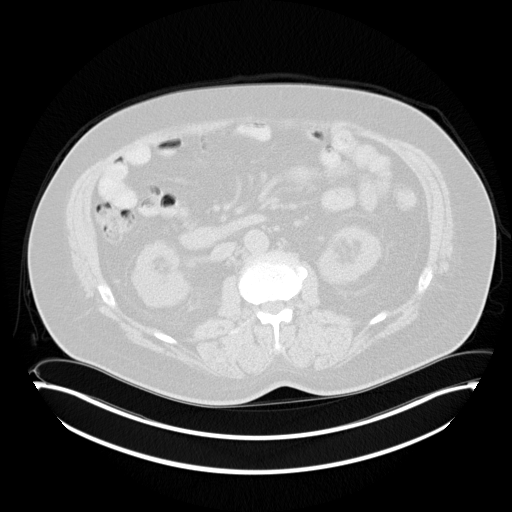
[im 138/242]
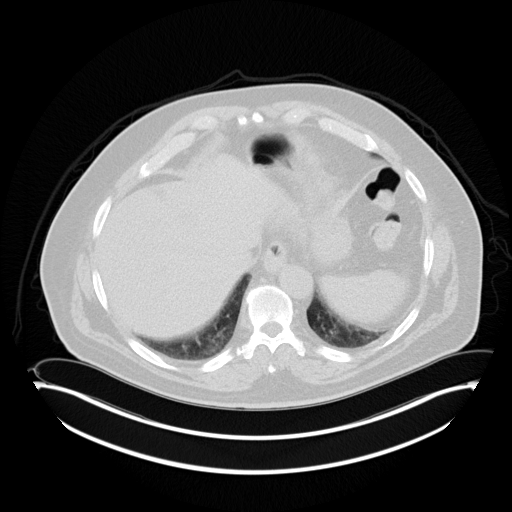
[im 173/242]
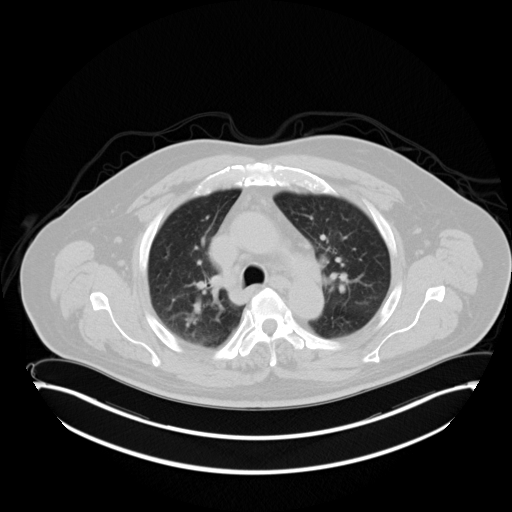
[im 207/242]
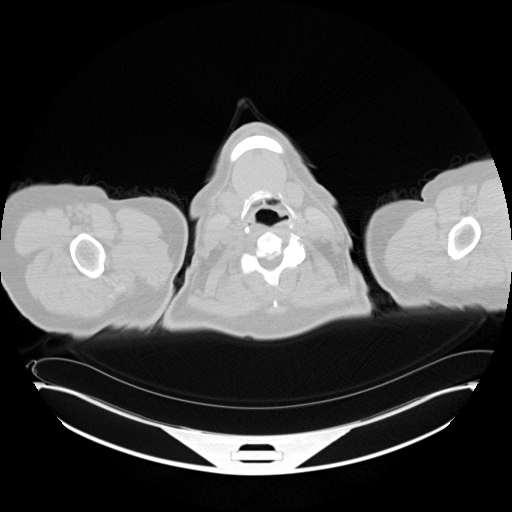
[im 242/242  brain]
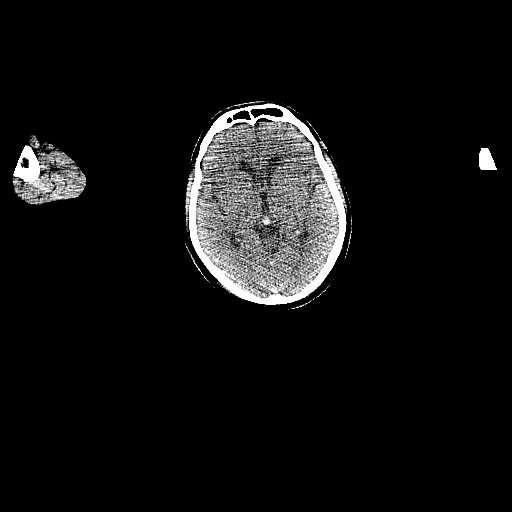

[Series 5: pet wb nac · axial · 4.0mm · 4.07mm/px · z∈[-972,-8]mm · 8 of 242 slices shown]
[im 1/242]
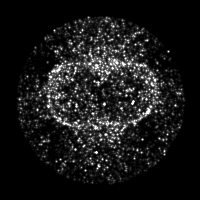
[im 35/242]
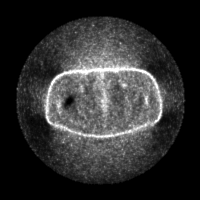
[im 69/242]
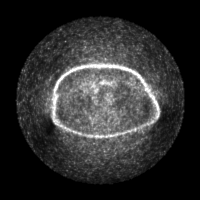
[im 104/242]
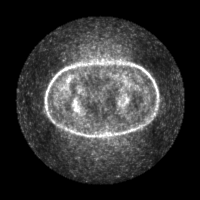
[im 138/242]
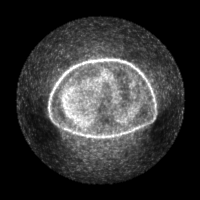
[im 173/242]
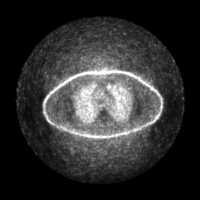
[im 207/242]
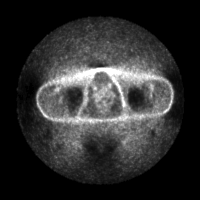
[im 242/242]
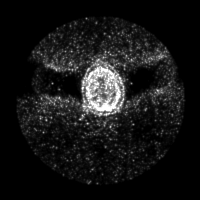

[25 of 25 positions shown; findings below may reference images not displayed]

FINDINGS: NECK/CHEST: There is been interval decrease in the intensity of lymph node 
uptake identified within the hilar regions, mediastinum, and left axilla. The 
number of nodes also. Decrease in the interim. There is a single left axillary 
lymph node remaining with a maximum SUV of 2.1. This node is not pathologically 
enlarged. The right subcarinal lymph node has a maximum SUV of 3.9 and had 
measured 4.6. The small nodular tissue in the anterior mediastinum shows maximum 
SUV of 2.5 as compared to 3.1 previously. Overall appearance of the small 
nodules are unchanged on the CT fusion images. No new or progressive FDG avid 
lymph nodes. 
ABDOMEN/PELVIS: No abnormal FDG uptake. 
MUSCULOSKELETAL: No abnormal FDG uptake. There is low-grade uptake over the left 
trochanter likely due to bursitis. 
ADDITIONAL CT FUSION FINDINGS: 3 mm right middle lobe nodule remains stable. 
Diverticular disease of the colon without diverticulitis. Spleen size normal. 
Right hip replacement.
IMPRESSION: 1. The degree of FDG uptake identified within the anterior mediastinal tissue 
has decreased slightly since a previous PET scan. Overall appearance of the 
tissue remains stable on the CT fusion images. 
2. Interval decrease in extent and intensity of the uptake in normal tissue in 
both hilar regions and left axilla. 
3. No new areas of uptake.

## 2020-04-24 IMAGING — CT CT CHEST WITHOUT CONTRAST
1 of 2 series · 15 of 32 positions shown, 19 images · non-contrast
Comparison: Comparison was made to the prior exam(s) within the last 12 months 
09/20/2019

CT CHEST WITHOUT CONTRAST, 04/24/2020 [DATE]: 
CLINICAL INDICATION:  Follow-up pulmonary nodules. 
A search for DICOM formatted images was conducted for prior CT imaging studies 
completed at a non-affiliated media free facility.
TECHNIQUE: The chest was scanned from base of neck through the lung bases 
without contrast on a high resolution low dose CT scanner. Routine MPR and MIP 
3D renderings were reconstructed on an independent workstation with concurrent 
physician supervision.

[Series 2: chest 2.0 i31s 3 · axial · 0.94mm/px · z∈[-87,+259]mm · 15 of 187 slices shown, 19 images]
[im 7/187  mediastinal]
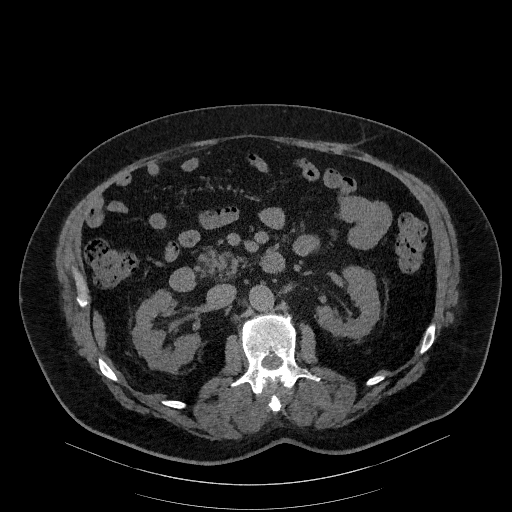
[im 7/187  lung]
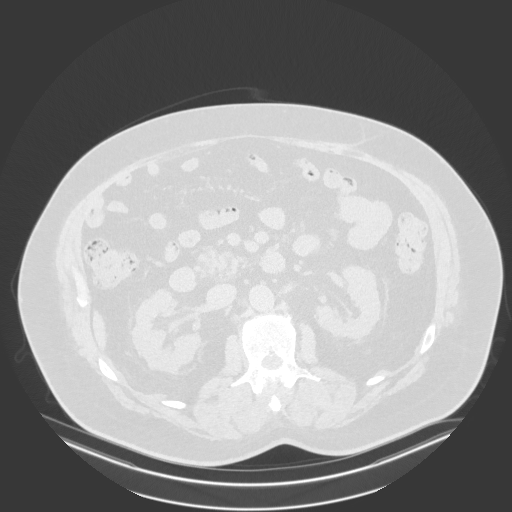
[im 21/187  lung]
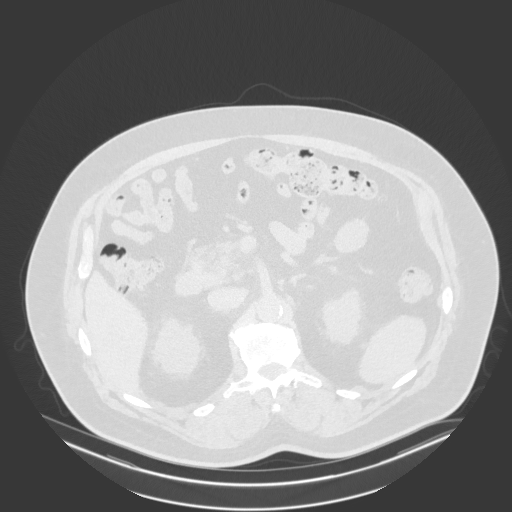
[im 35/187  lung]
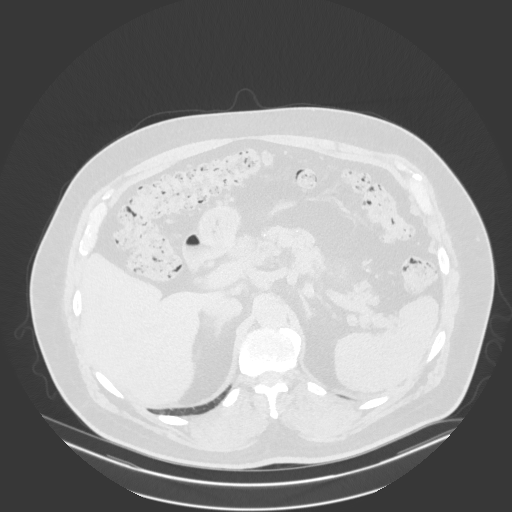
[im 49/187  lung]
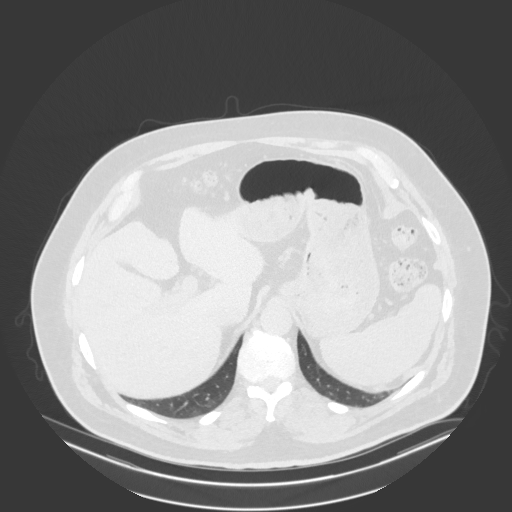
[im 63/187  mediastinal]
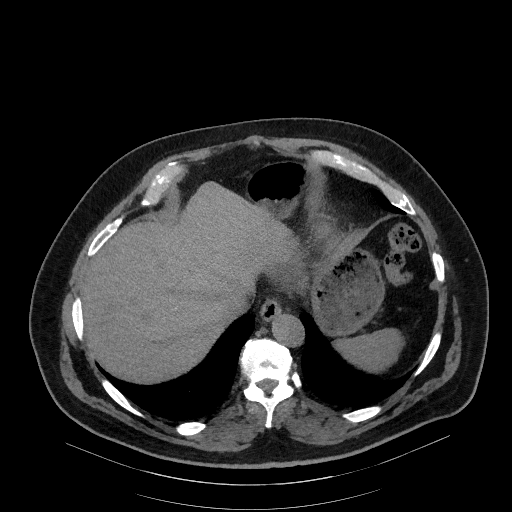
[im 63/187  lung]
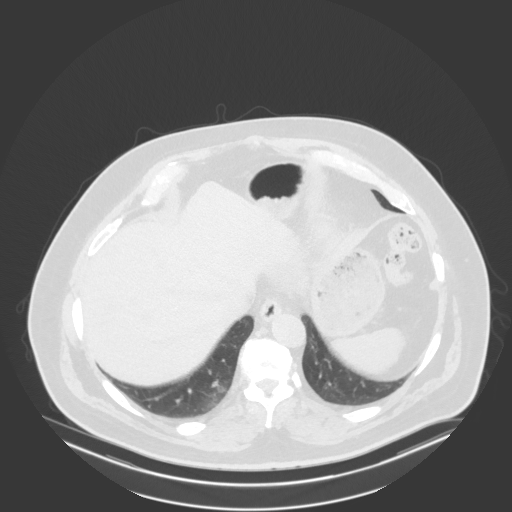
[im 69/187  lung]
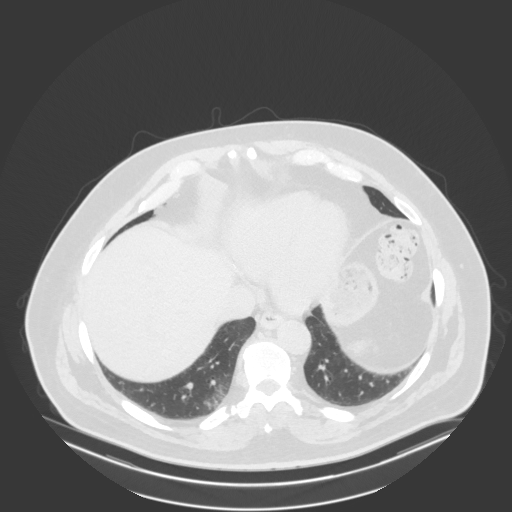
[im 83/187  lung]
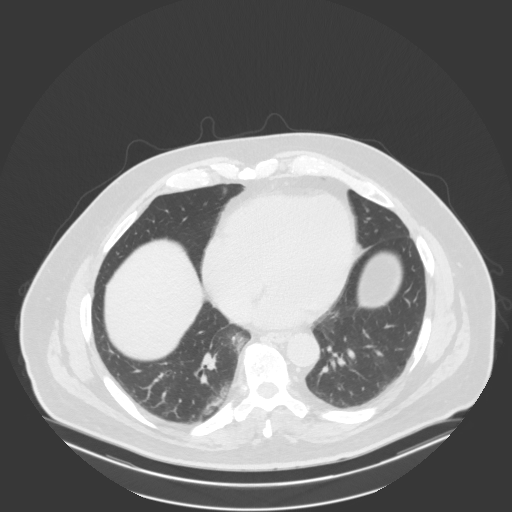
[im 94/187  lung]
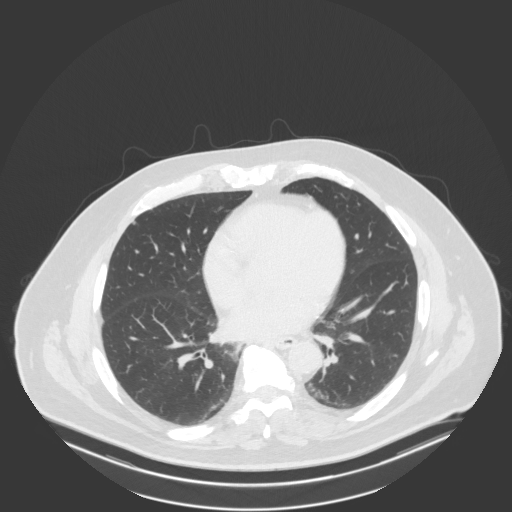
[im 104/187  mediastinal]
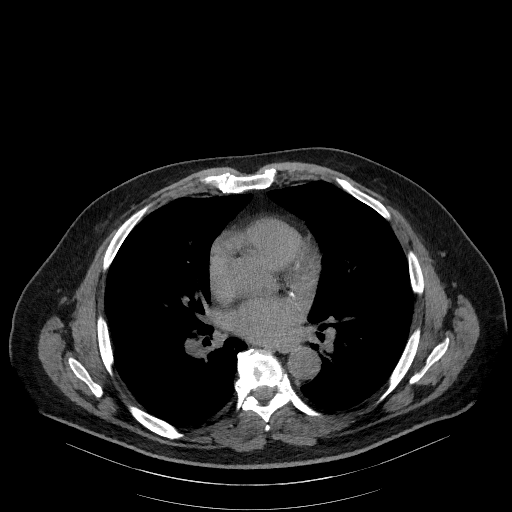
[im 104/187  lung]
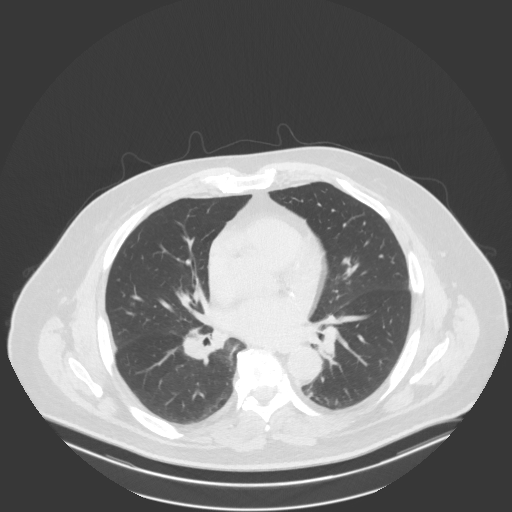
[im 118/187  lung]
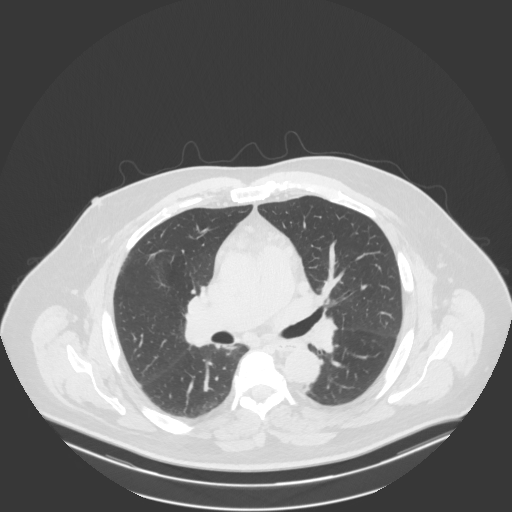
[im 125/187  lung]
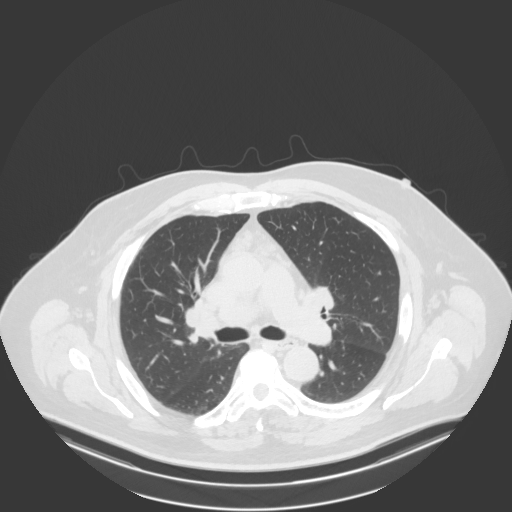
[im 138/187  lung]
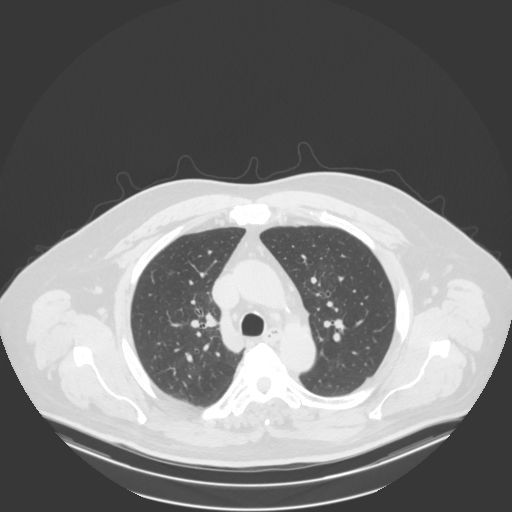
[im 152/187  mediastinal]
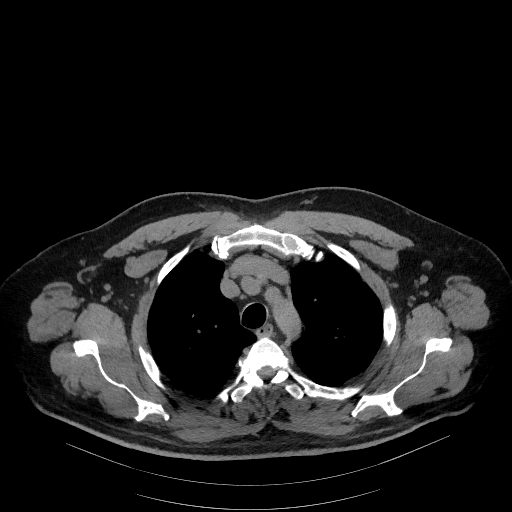
[im 152/187  lung]
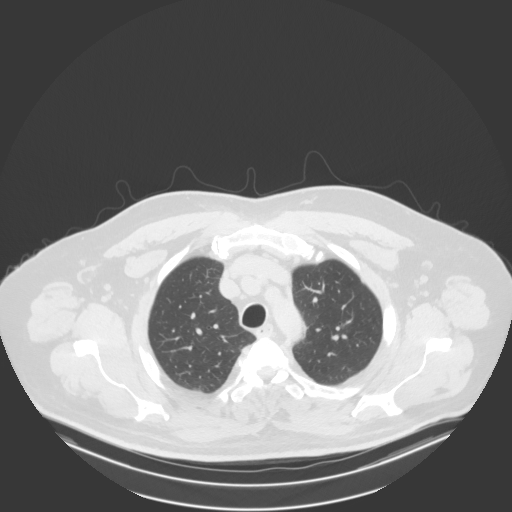
[im 166/187  lung]
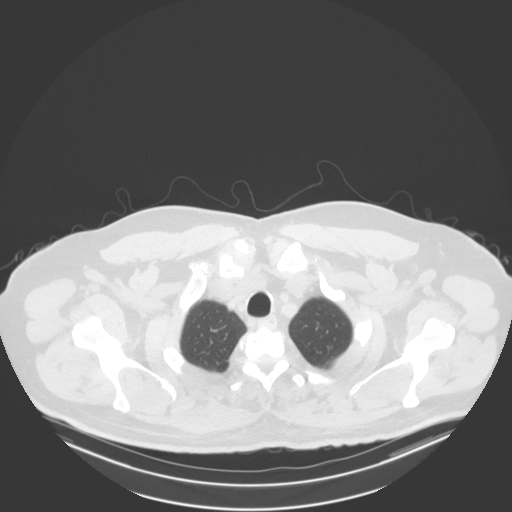
[im 180/187  lung]
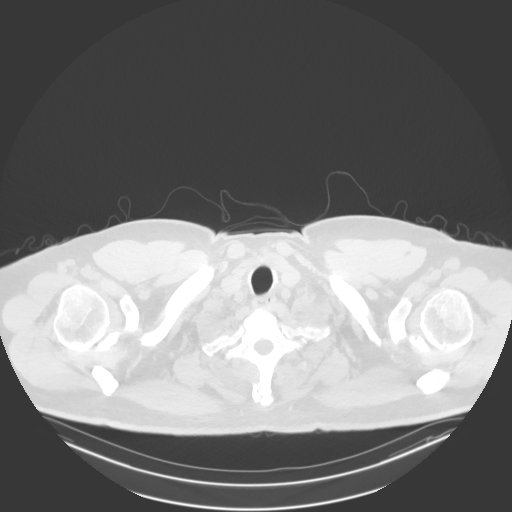

[15 of 32 positions shown; findings below may reference images not displayed]

FINDINGS: LUNGS AND PLEURA:  There are scattered micronodules are once again identified. 
The largest is in the left lower lobe posterior medially at 8 mm. This has been 
seen since December 2018 exam when it measured 7 mm. Uncertain if this is truly 
grown significantly or just better visualized. Questionable FDG activity on the 
recent PET scan though there is motion in this area and it is a questionable 
finding. No new nodules or pleural effusion seen. 
MEDIASTINUM:  Stable mixed nodular fat density within the anterior mediastinum. 
This has also been seen and grossly stable since December 2018. No new 
abnormality seen. Marked coronary calcifications. 
CHEST WALL/AXILLA: No mass or adenopathy. 
UPPER ABDOMEN: Negative. 
MUSCULOSKELETAL: Degenerative changes with endplate changes and osteophyte 
formation.
IMPRESSION: Question minimally increased nodule in the left lung base and December 2018. 
Continued surveillance with repeat exam in 6-12 months recommended. No new 
nodules seen. 
Stable changes within the anterior mediastinum also since at least December 2018. No new abnormality seen. 
Atherosclerotic changes and degenerative changes. 
RADIATION DOSE REDUCTION: All CT scans are performed using radiation dose 
reduction techniques, when applicable.  Technical factors are evaluated and 
adjusted to ensure appropriate moderation of exposure.  Automated dose 
management technology is applied to adjust the radiation doses to minimize 
exposure while achieving diagnostic quality images.

## 2020-09-04 IMAGING — DX HIP 2 VIEWS RIGHT WITH PELVIS
1 series · 4 of 4 positions shown · non-contrast
Comparison: PET/CT exam of 03/31/2020. Radiographs of 08/04/2017.

HIP 2 VIEWS RIGHT WITH PELVIS, 09/04/2020 [DATE]: 
CLINICAL INDICATION:  Pain in the right hip for one week. Surgery in 5609. No 
trauma.

[Series 1: AP · U · 0.14mm/px · 4 of 4 slices shown]
[im 1/4]
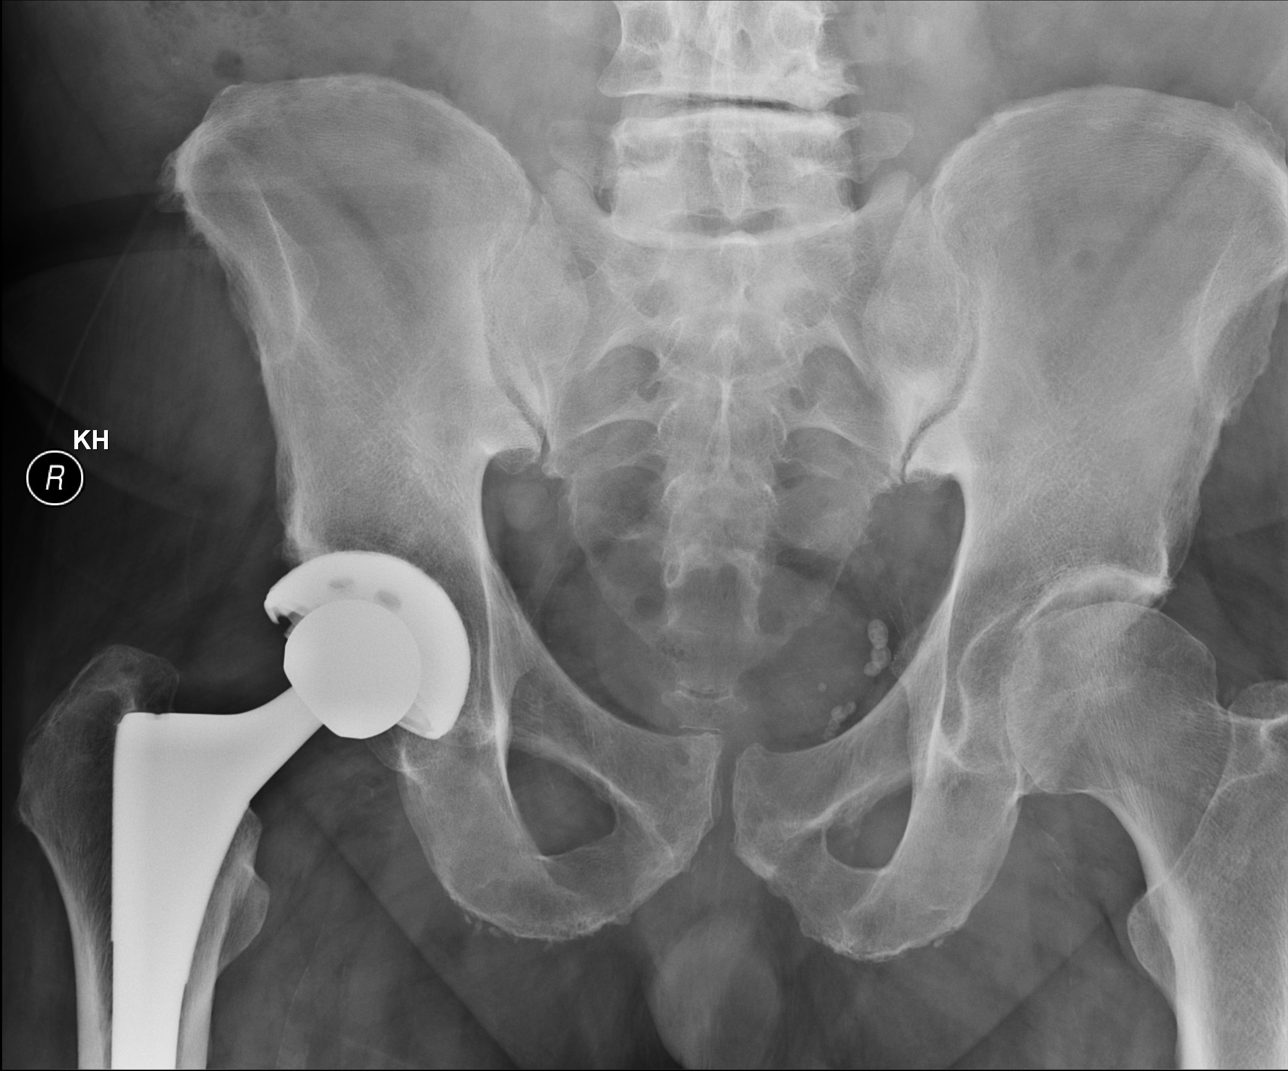
[im 2/4]
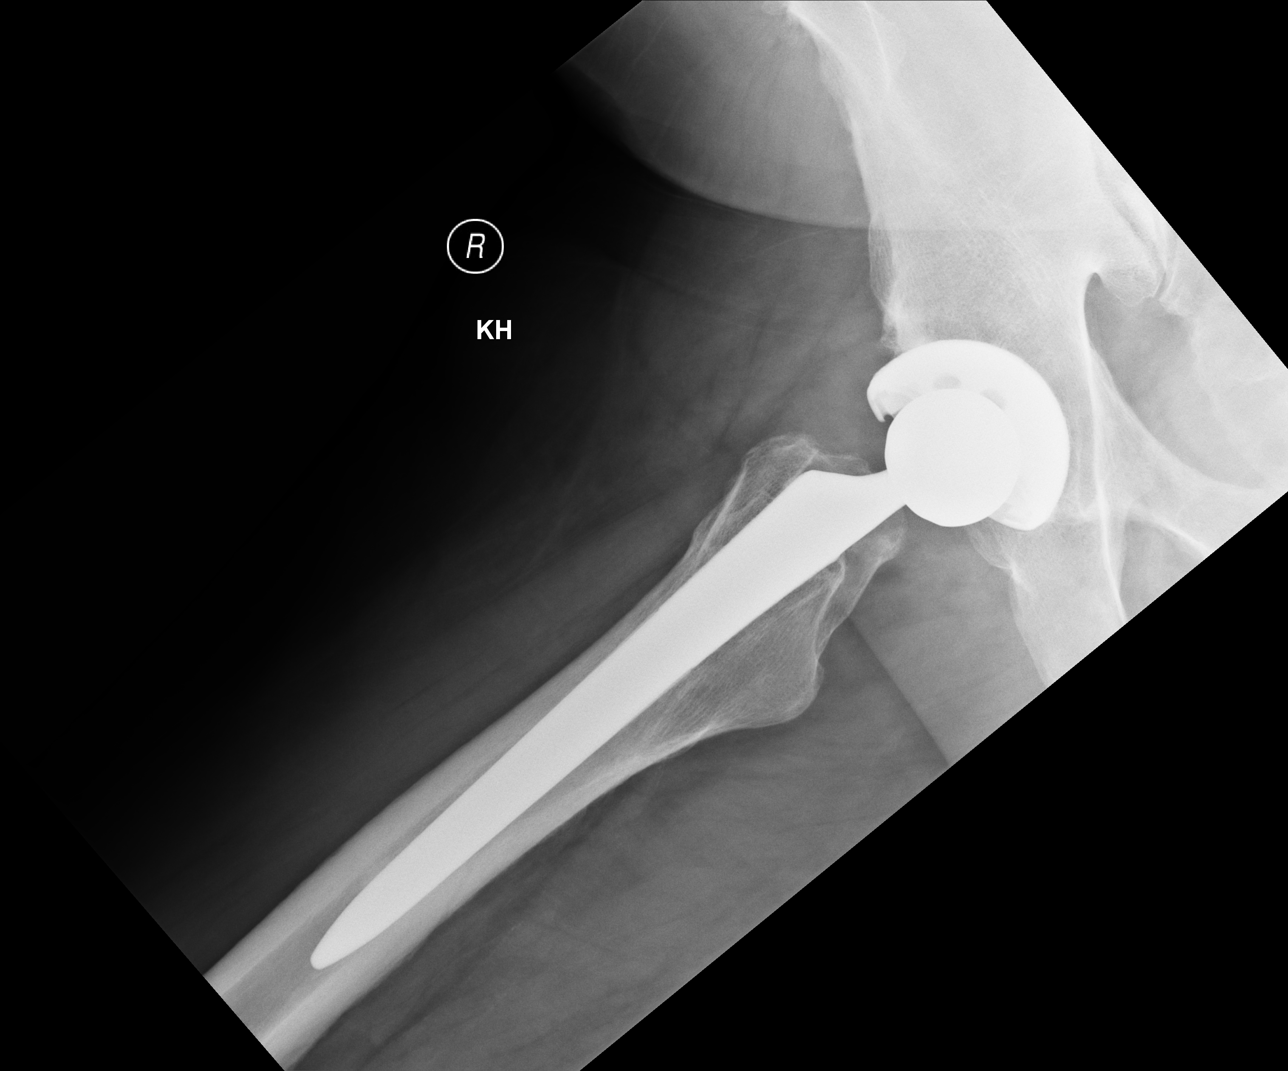
[im 3/4]
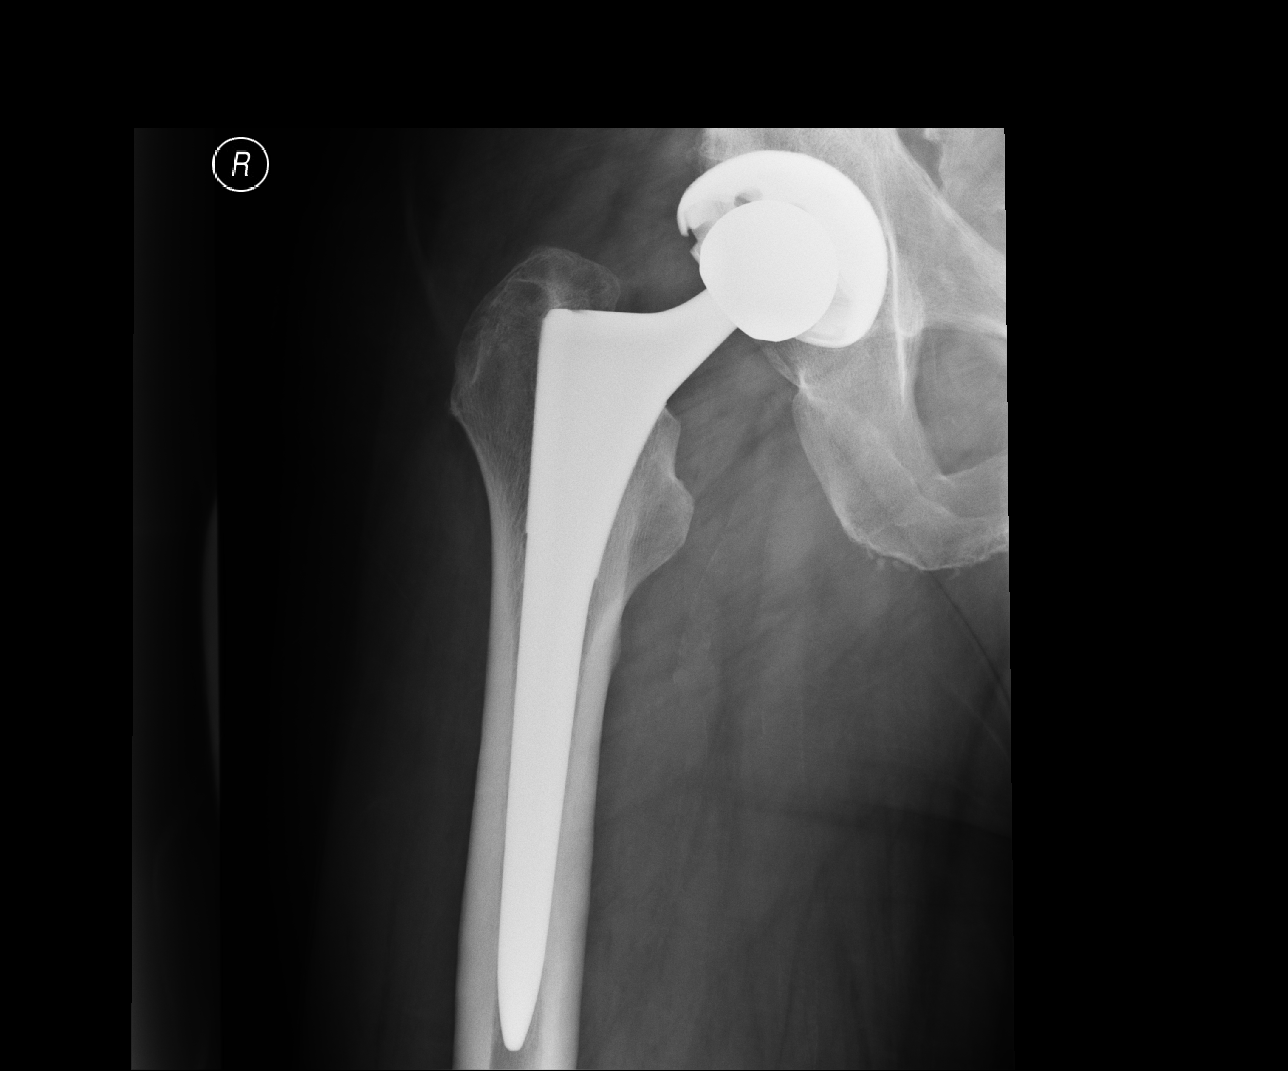
[im 4/4]
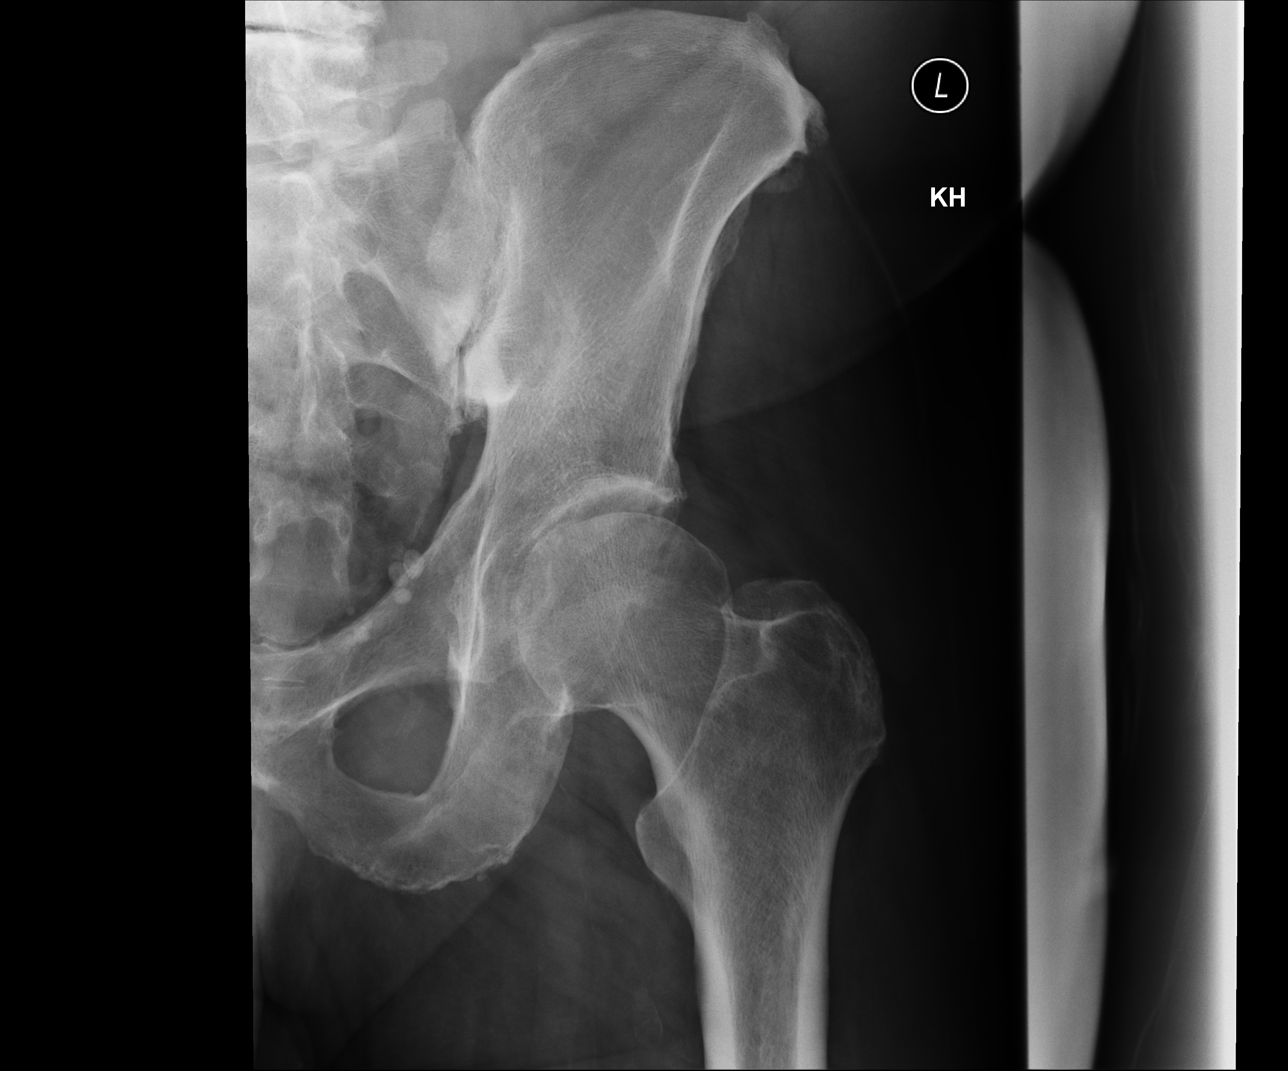

[4 of 4 positions shown; findings below may reference images not displayed]

FINDINGS: Right THA. Hardware is intact. There is physiologic alignment. No 
periprosthetic lucency or suggestion of mechanical loosening or osteolysis. No 
fracture. Mild joint space narrowing left hip. Degenerative changes lower lumbar 
spine and SI joints with osteophytic spurring. Pelvic phleboliths.
IMPRESSION: 1.  Right THA. 
2.  No acute osseous abnormality. If symptoms persist, consideration could be 
made for MRI examination of the right hip with metal suppression software.

## 2020-09-15 IMAGING — MR MRI RIGHT HIP WITHOUT CONTRAST
4 of 9 series · 10 of 40 positions shown · IV contrast (gadolinium)
Comparison: Radiograph of 09/04/2020. PET/CT exam of 03/31/2020.

FINAL Diagnostic Imaging Report 
________________________________________________________________________________________________ 
MRI RIGHT HIP WITHOUT CONTRAST, 09/15/2020 [DATE]: 
CLINICAL INDICATION: NHW.[id number] pain right hip. Chronic lower back pain and pain 
going down right leg. History of thyroid cancer.
TECHNIQUE: Multiplanar, multiecho position MR images of the hip were performed 
without intravenous gadolinium enhancement. Large field-of-view images were 
performed of the pelvis to include the contralateral hip for comparison. 
Metallic suppression software and techniques were used.

[Series 101: survey_fullfov_transversal · axial · 10.0mm · 1.84mm/px · z∈[-51,+51]mm · 2 of 7 slices shown]
[im 1/7]
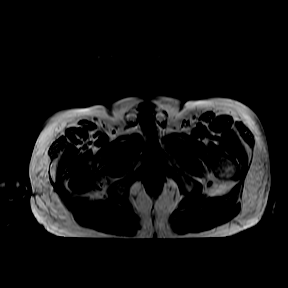
[im 7/7]
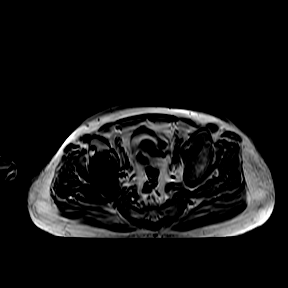

[Series 301: (person_name)2(person_name)_(person_name) · axial · 6.0mm · 0.52mm/px · 1 of 10 slices shown]
[im 1/10]
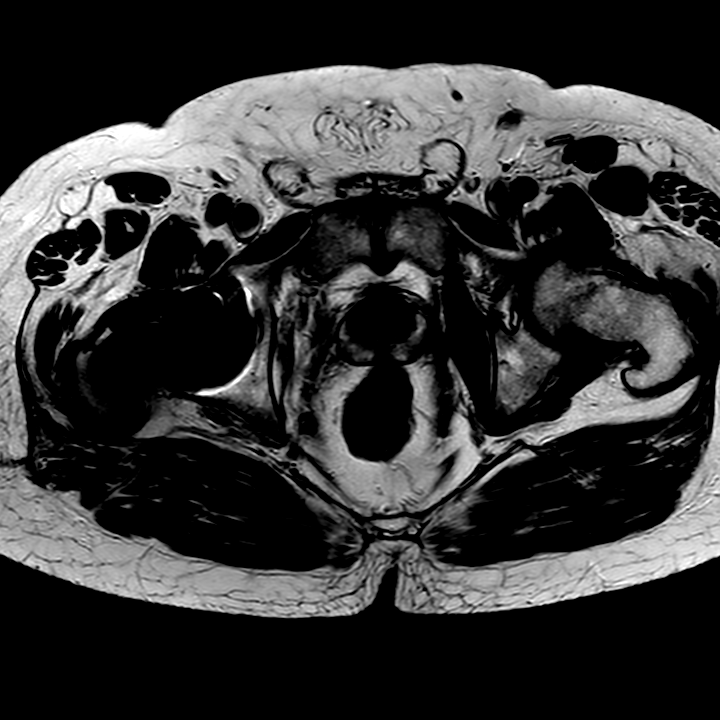

[Series 401: t1_(person_name) · axial · 5.5mm · 0.63mm/px · z∈[-147,+172]mm · 4 of 44 slices shown]
[im 1/44]
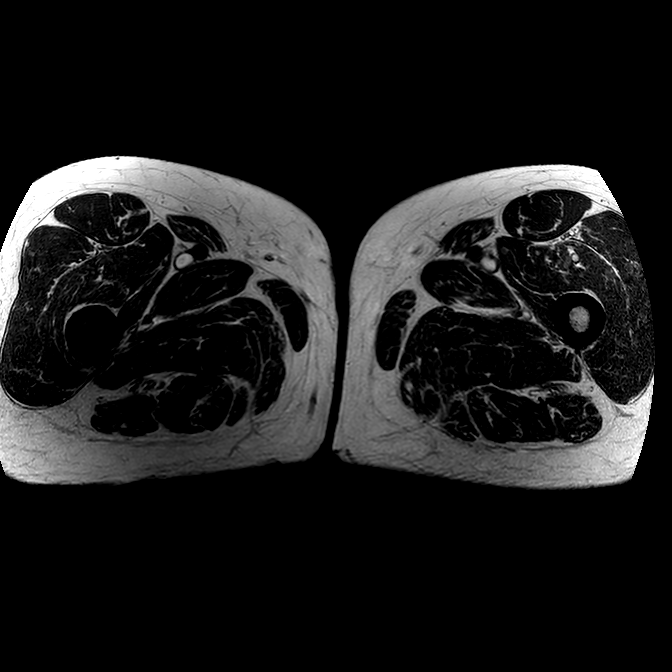
[im 9/44]
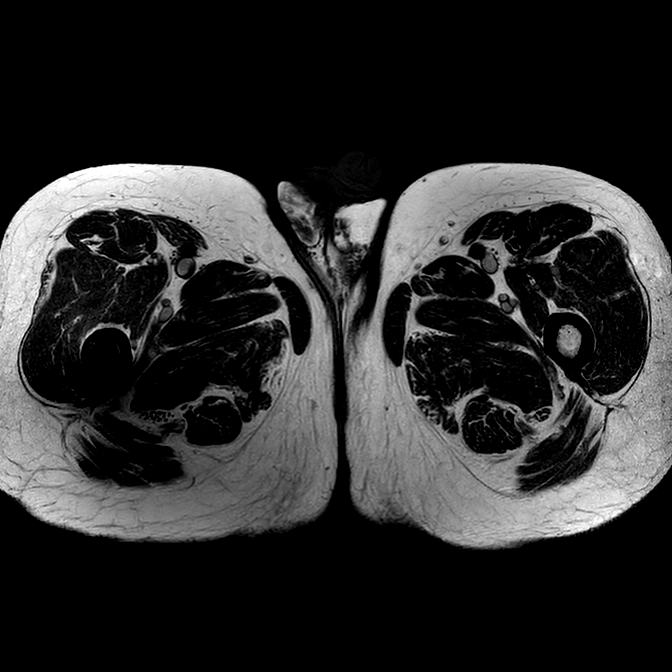
[im 26/44]
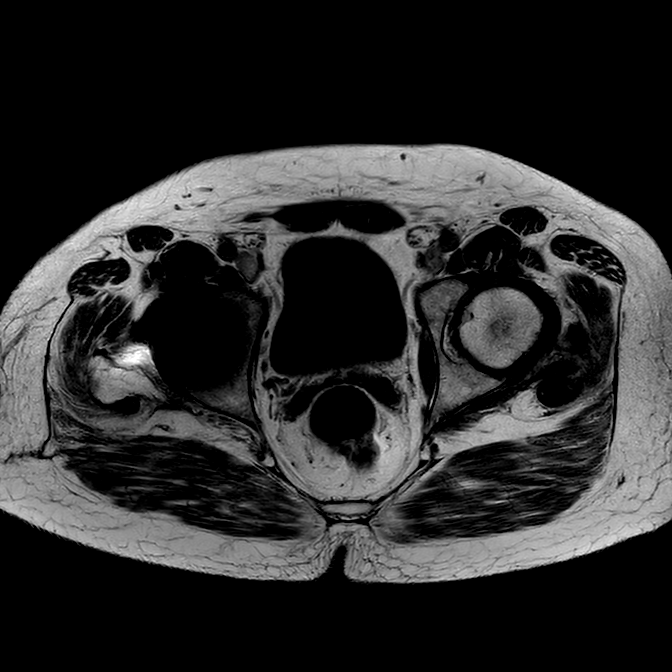
[im 44/44]
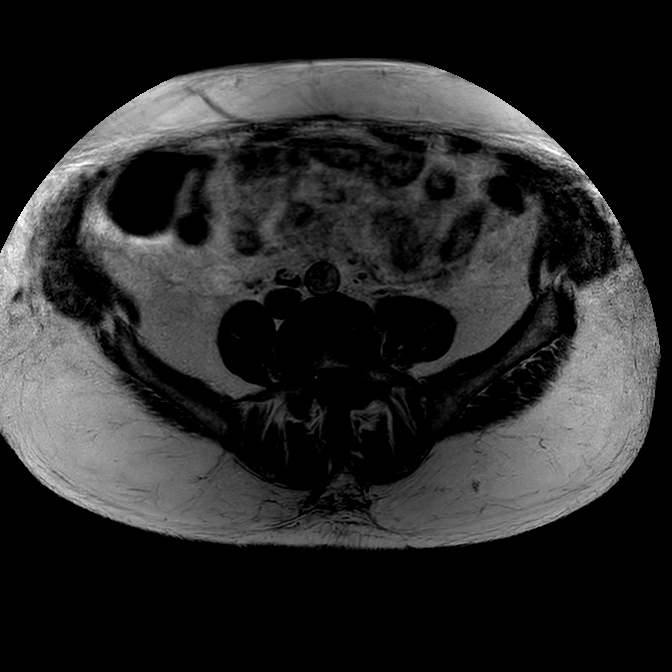

[Series 501: stir_(person_name) · coronal · 5.0mm · 0.44mm/px · 3 of 36 slices shown]
[im 1/36]
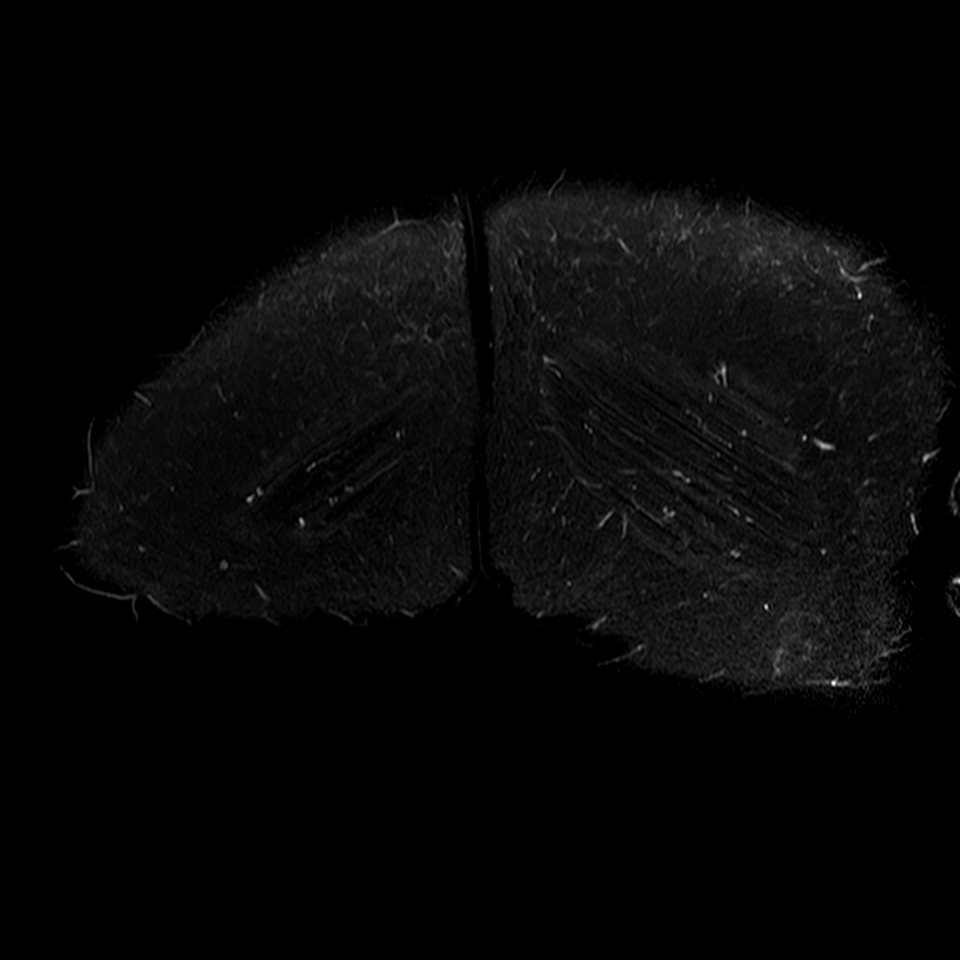
[im 18/36]
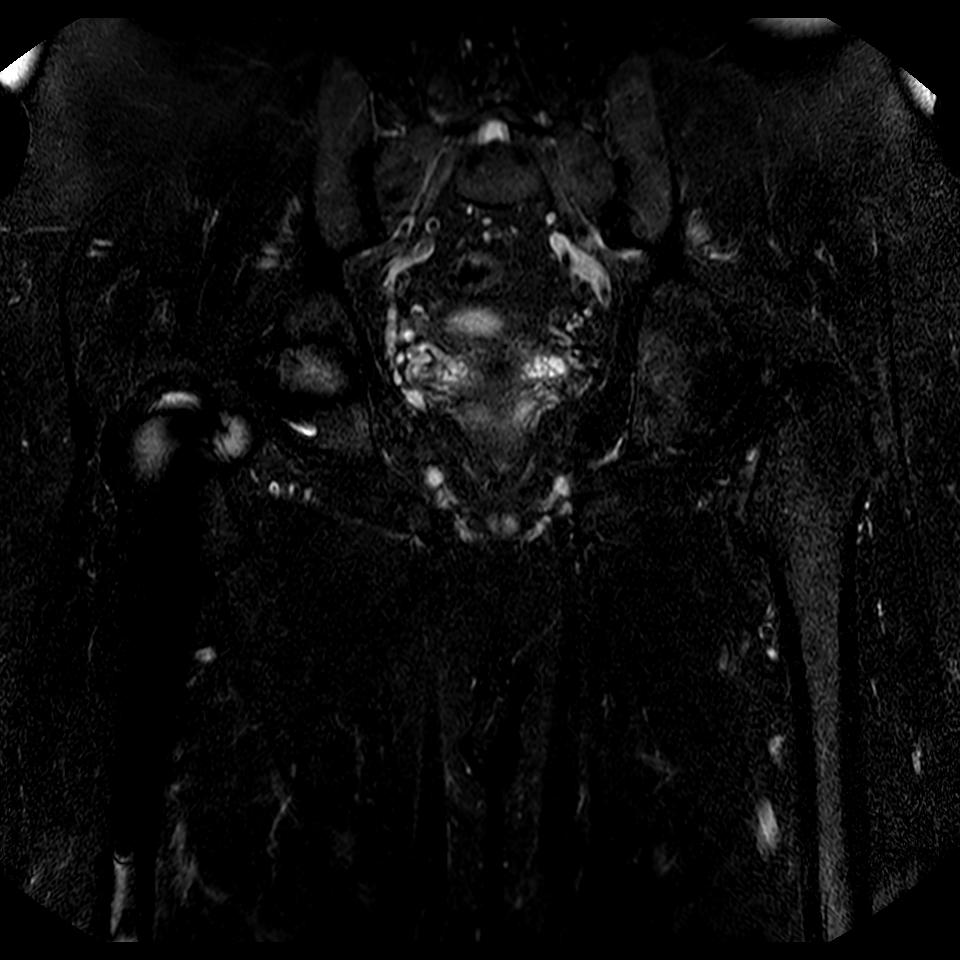
[im 36/36]
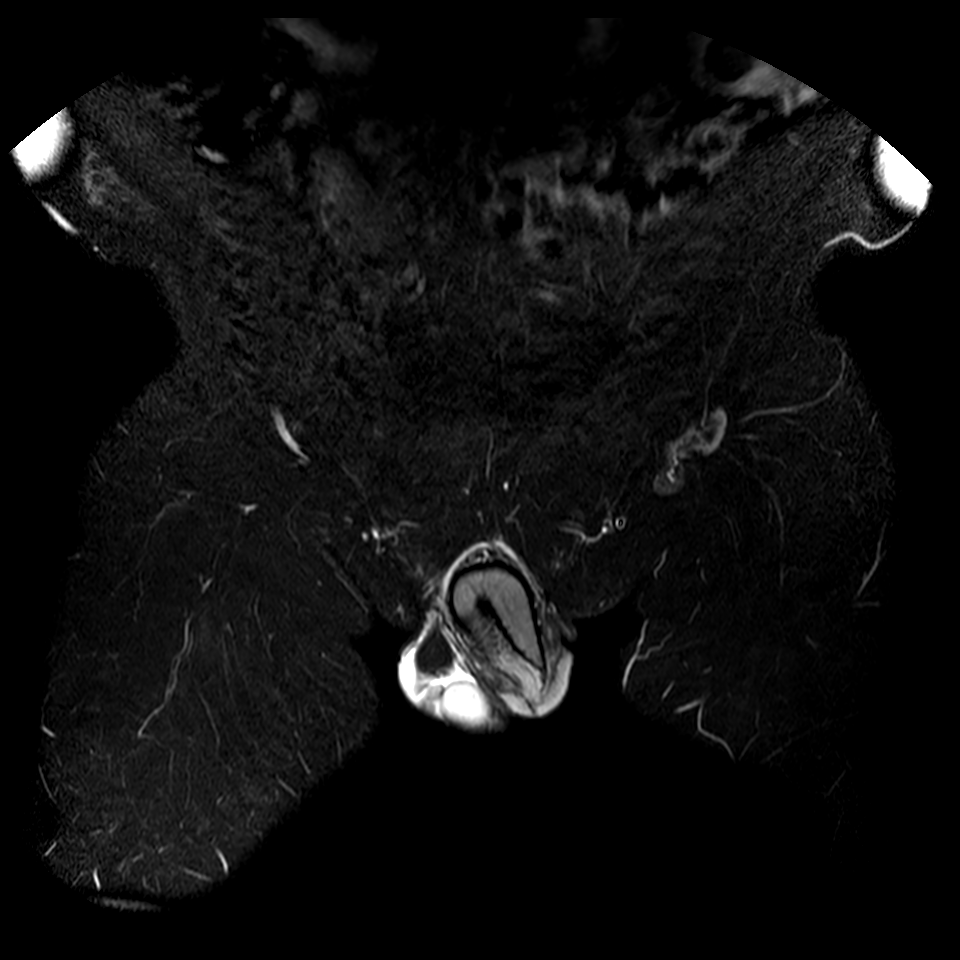

[10 of 40 positions shown; findings below may reference images not displayed]

FINDINGS: BONES AND JOINTS: Right THA. Hardware appears intact. There is physiologic 
alignment. No periprosthetic signal abnormality to suggest mechanical loosening, 
osteolysis, fracture, stress response or adverse local tissue response. Advanced 
articular cartilaginous loss left hip. Mild subcortical cystic change left 
femoral head. No prominent hip effusion. Bone marrow signal intensity of the 
proximal femurs, pelvis, sacrum and included lower lumbar spine is negative for 
fracture or pathologic marrow replacing lesion. Specifically, no evidence of 
metastatic disease. Degenerative changes included lower lumbar spine and both SI 
joints. 
SOFT TISSUES: The bilateral abductor cuffs are preserved. The insertions of the 
iliopsoas tendons are intact. Mild tendinosis of the origin of the hamstrings. 
Rectus femoris-adductor complex is preserved. The musculature is symmetric 
without strain, atrophy or mass. No focal fluid collection or distended bursa. 
Specifically, no iliopsoas or trochanteric bursitis. Included neurovascular 
bundles are negative. Intrapelvic contents include sigmoid diverticula without 
acute diverticulitis.
IMPRESSION: 1.  Right THA without periprosthetic abnormality identified. 
2.  Degenerative changes including advanced involvement of the left hip.

## 2020-09-15 IMAGING — MR MRI LUMBAR SPINE WITHOUT CONTRAST
4 of 7 series · 20 of 48 positions shown · IV contrast (gadolinium)
Comparison: Radiographs of the hips and pelvis of 09/04/2020. PET/CT exam of 
03/31/2020.

FINAL Diagnostic Imaging Report 
________________________________________________________________________________________________ 
MRI LUMBAR SPINE WITHOUT CONTRAST, 09/15/2020 [DATE]: 
CLINICAL INDICATION: VB7.BY low back pain. Chronic lower back pain going down 
right leg. History of right hip surgery in 9921.
TECHNIQUE: Multiplanar, multiecho position MR images of the lumbar spine were 
performed without intravenous gadolinium enhancement. Patient was scanned on a 
1.5T magnet.

[Series 201: survey · axial · 10.0mm · 1.39mm/px · z∈[-33,+201]mm · 3 of 10 slices shown]
[im 1/10]
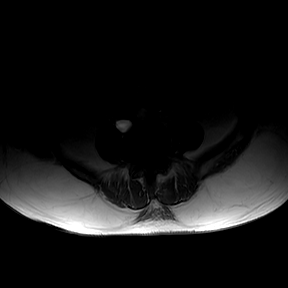
[im 5/10]
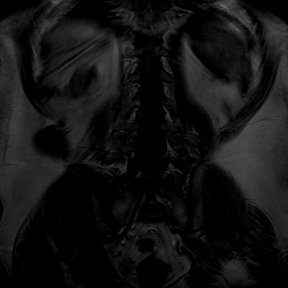
[im 10/10]
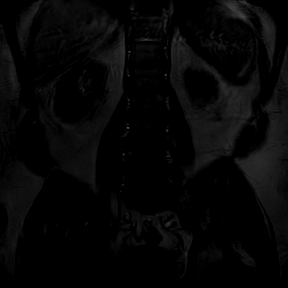

[Series 301: t2w_cor-surv · coronal · 6.0mm · 0.60mm/px · 2 of 5 slices shown]
[im 1/5]
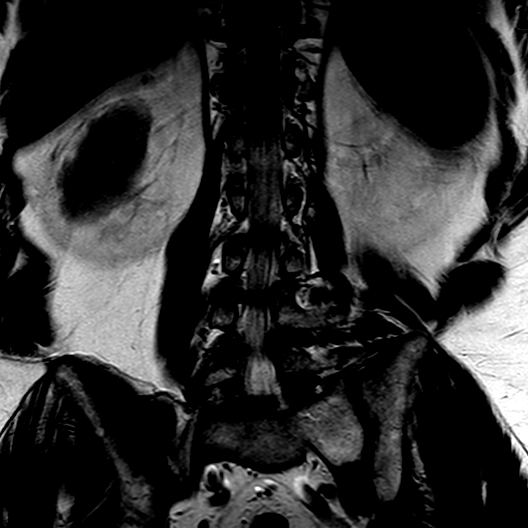
[im 5/5]
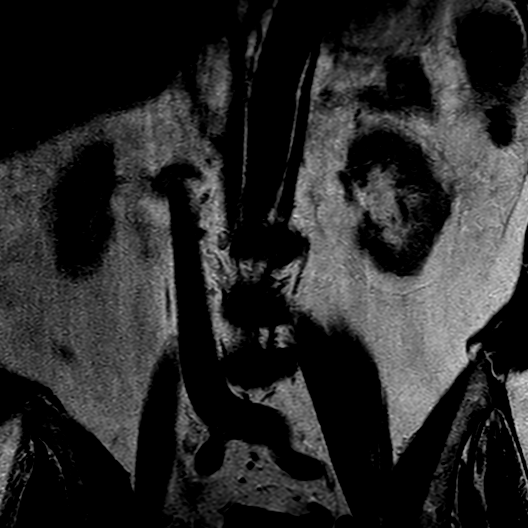

[Series 401: t1_tse_sag · sagittal · 4.5mm · 0.48mm/px · 4 of 19 slices shown]
[im 1/19]
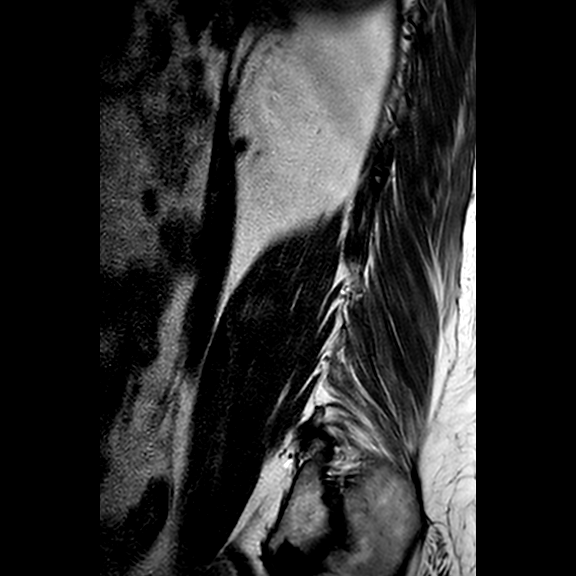
[im 4/19]
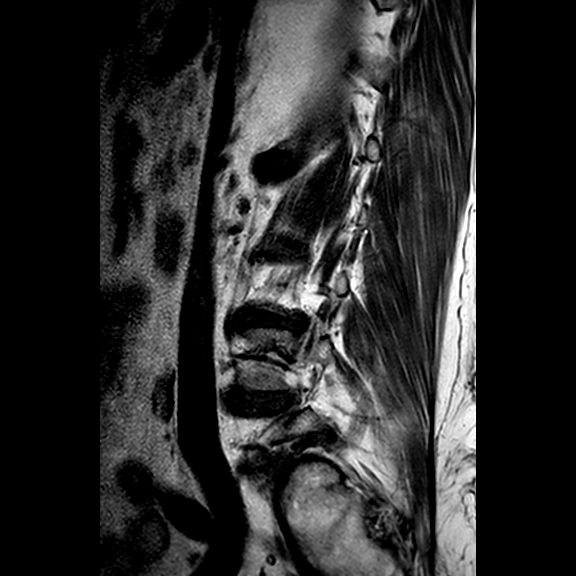
[im 10/19]
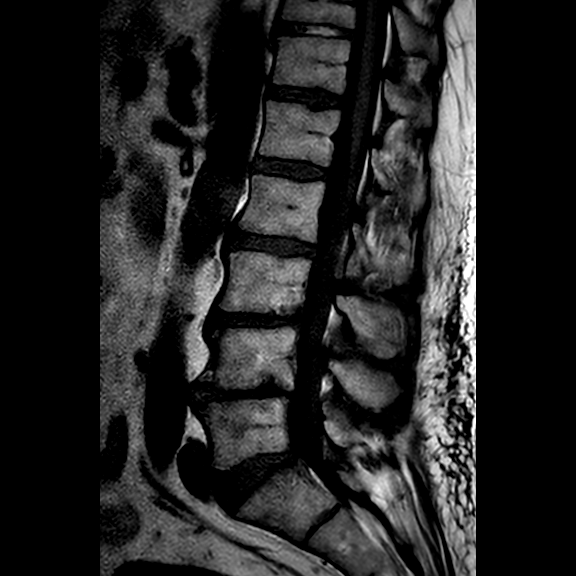
[im 16/19]
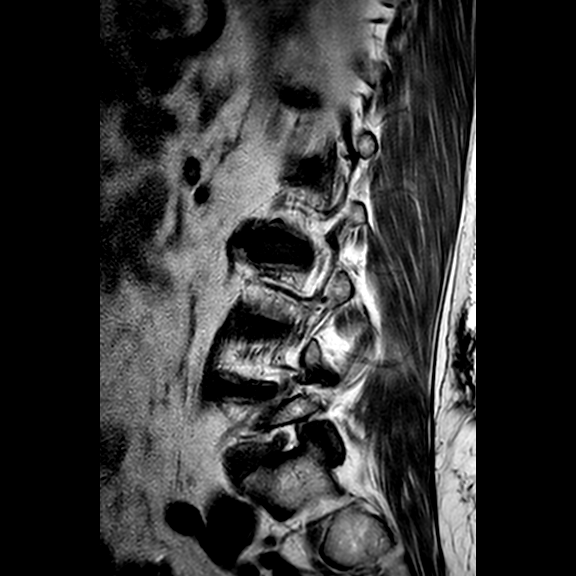

[Series 801: T1 · axial · 4.0mm · 0.38mm/px · z∈[-113,+101]mm · 11 of 30 slices shown]
[im 1/30]
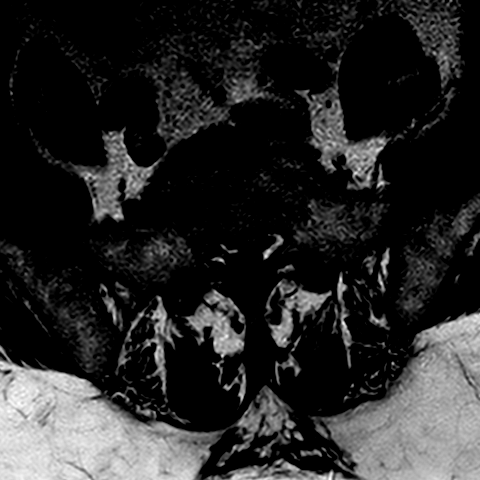
[im 3/30]
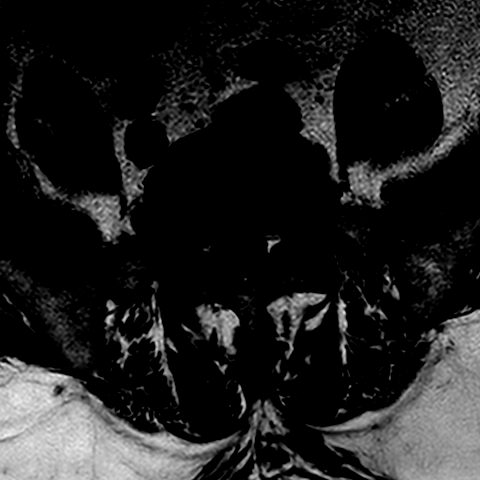
[im 6/30]
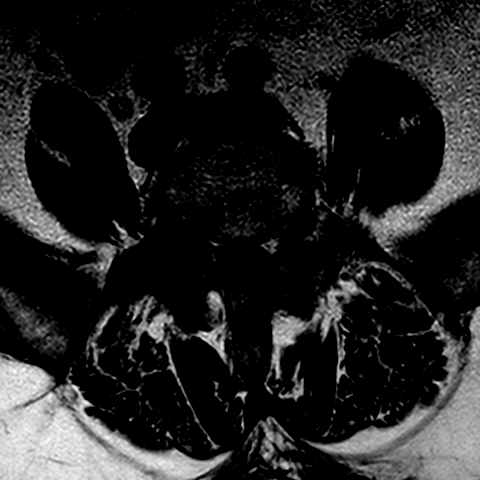
[im 9/30]
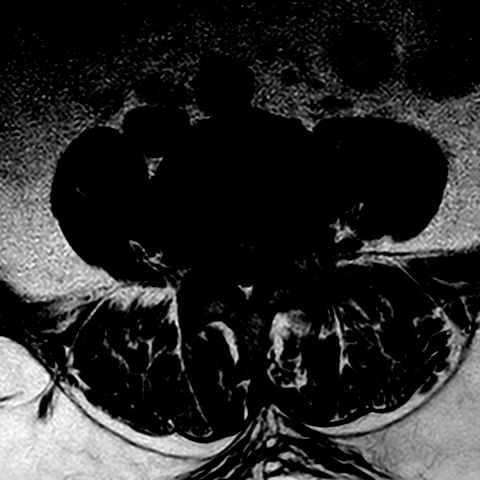
[im 12/30]
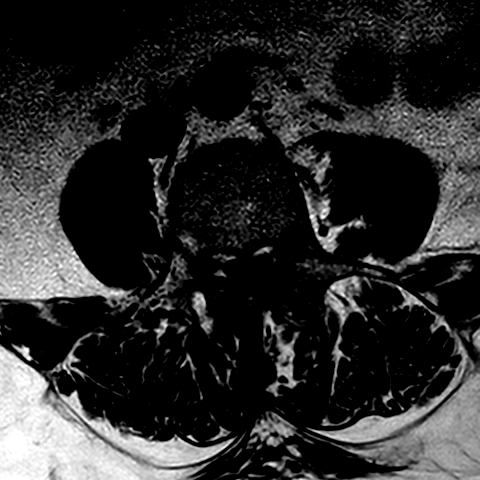
[im 15/30]
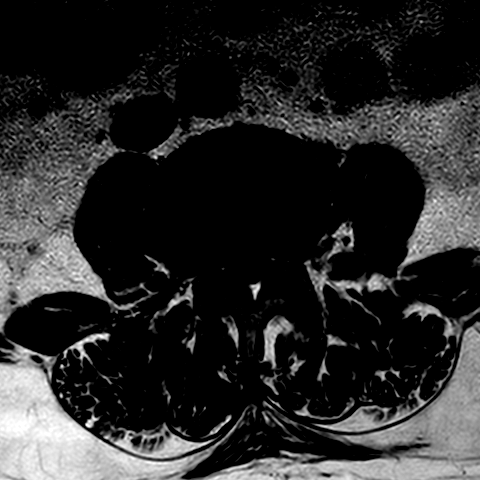
[im 18/30]
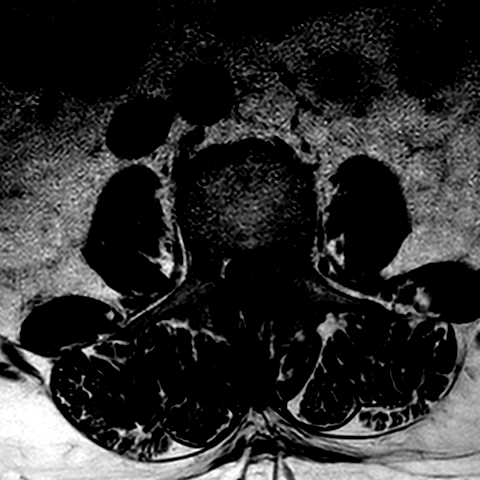
[im 21/30]
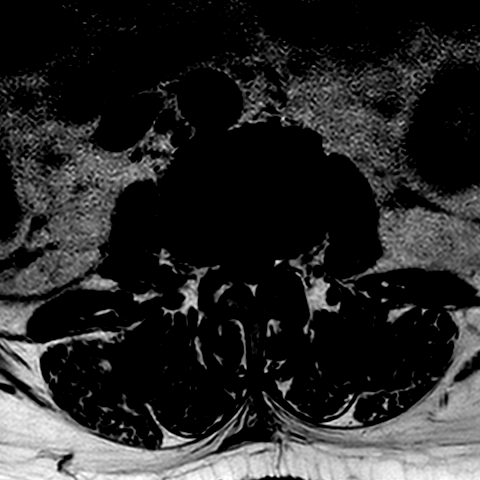
[im 24/30]
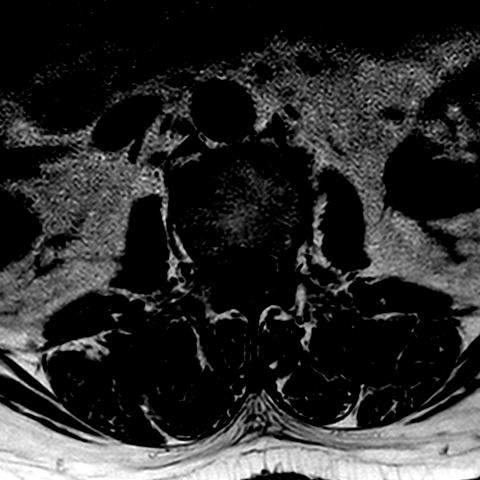
[im 27/30]
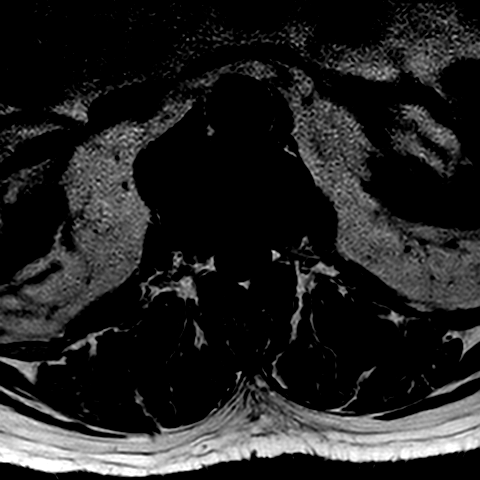
[im 30/30]
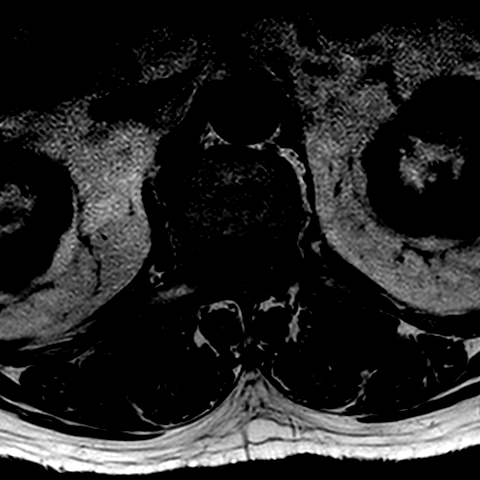

[20 of 48 positions shown; findings below may reference images not displayed]

FINDINGS: Nomenclature is based on 5 lumbar type vertebral bodies. Mild lower 
lumbar dextrocurvature. Scattered interbody osteophytic spurring. Trace 
retrolisthesis L4 on L5. The conus tip terminates at the L1 vertebral body 
level. The aorta is normal in diameter. The posterior paraspinal musculature is 
symmetric. There is metallic susceptibility artifact about the right hip  
Modic I-II: None. 
Ligamentum Flavum > 2.5 mm: All levels 
T12-L1: Mild disc desiccation without disc height loss. Mild facet hypertrophy. 
No spinal canal or neural foraminal stenosis. 
L1-L2: Mild disc desiccation without disc height loss. Mild facet hypertrophy. 
No spinal canal or neural foraminal stenosis. 
L2-L3: Moderate disc desiccation without disc height loss. Shallow annular 
bulge. Moderate facet hypertrophy. Small right facet effusion. Mild right 
greater than left neural foraminal stenoses. Mild spinal canal stenosis with the 
thecal sac narrowed to 8 mm the level the mid sagittal plane. 
L3-L4: Mild disc desiccation and disc height loss. Mild broad-based disc 
protrusion eccentric to the right far posterior lateral aspect with abutment of 
the exiting right greater than left L3 nerve roots. Moderate bilateral neural 
foraminal stenoses. Mild left lateral recess stenosis. Moderate facet 
hypertrophy. Moderate spinal canal stenosis with the thecal sac narrowed to 6 mm 
AP. 
L4-L5: Moderate disc desiccation and disc height loss. Mild to moderate dorsal 
disc osteophyte complex eccentric to the left posterior lateral aspect with 
abutment of the exiting left greater than right L4 nerve roots. Moderate neural 
foraminal stenoses. Mild spinal canal stenosis. 
L5-S1: Mild disc desiccation and moderate dorsal disc height loss. Shallow disc 
protrusion. Mild facet hypertrophy. Moderate bilateral neural foraminal 
stenoses.
IMPRESSION: 1.  Advanced spondylotic changes lumbar spine. 
2.  Trace retrolisthesis L4 on L5. 
3.  At L2-L3, there is spinal canal and mild left greater than right neural 
foraminal stenoses. 
4.  At L3-L4, there is disc herniation eccentric to the right far lateral aspect 
with potential exiting right greater than left neural impingement. Additionally 
there is spinal canal, left lateral recess and bilateral neural foraminal 
stenoses 
5.  At L4-L5, there is disc osteophyte complex eccentric to the left posterior 
lateral aspect potential for exiting left greater than right neural impingement. 
Mild spinal canal and moderate neural foraminal stenoses. 
6.  At L5-S1, there are moderate bilateral neural foraminal stenoses.

## 2020-11-21 IMAGING — CT CT ABDOMEN AND PELVIS WITHOUT CONTRAST
2 of 3 series · 16 of 46 positions shown, 18 images · non-contrast
Comparison: PET/CT exam of 03/31/2020.

________________________________________________________________________________________________ 
CT ABDOMEN AND PELVIS WITHOUT CONTRAST, 11/21/2020 [DATE]: 
CLINICAL INDICATION: Unspecified abdominal pain. Lower abdominal pain and 
diarrhea for 2 weeks. 
A search for DICOM formatted images was conducted for prior CT imaging studies 
completed at a non-affiliated media free facility.
TECHNIQUE: The abdomen and pelvis were scanned from lung bases through the 
pubic rami without contrast on a high-resolution CT scanner using dose reduction 
techniques. Routine MPR reconstructions were performed.

[Series 2: abd/pel ax wo · axial · 0.96mm/px · z∈[-488,-2]mm · 13 of 188 slices shown, 15 images]
[im 13/188  soft-tissue]
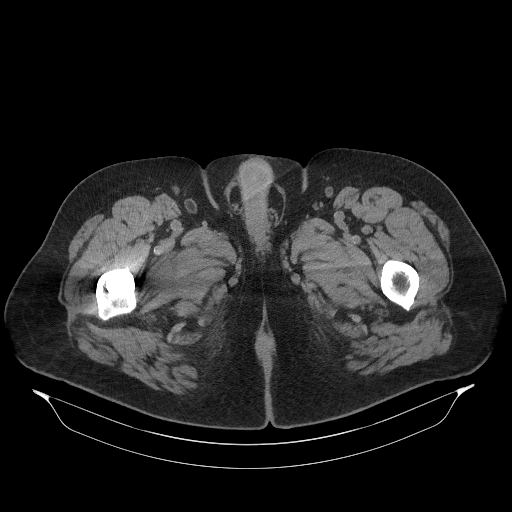
[im 13/188  bone]
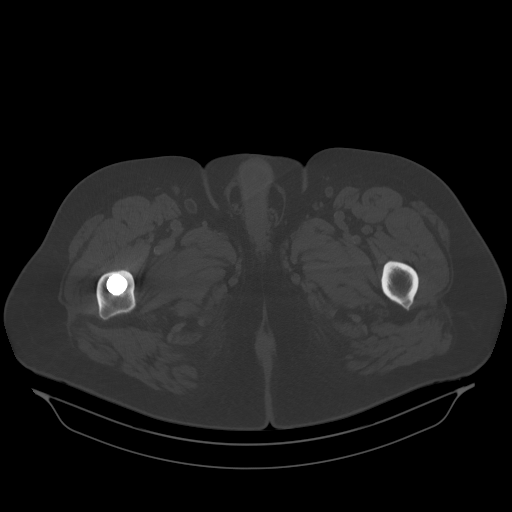
[im 25/188  soft-tissue]
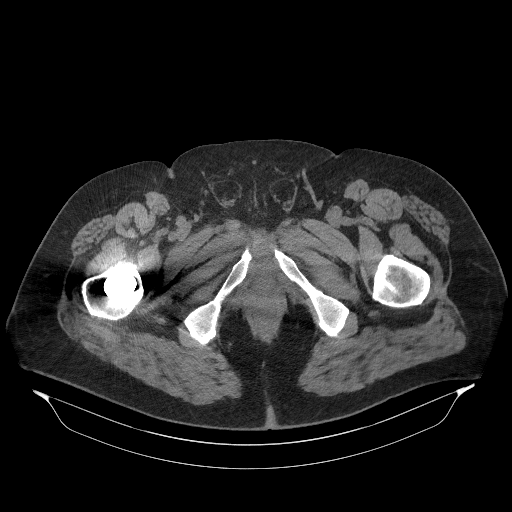
[im 37/188  soft-tissue]
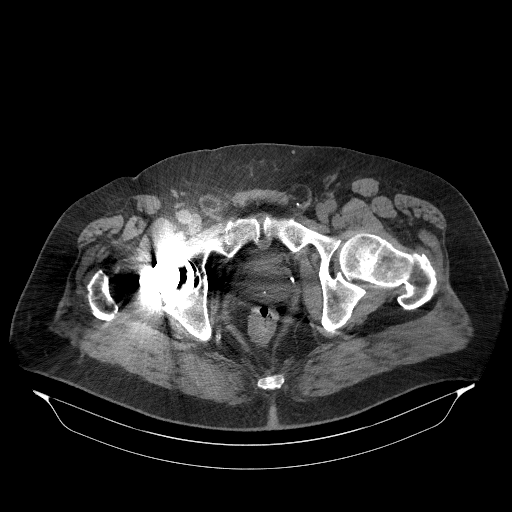
[im 55/188  soft-tissue]
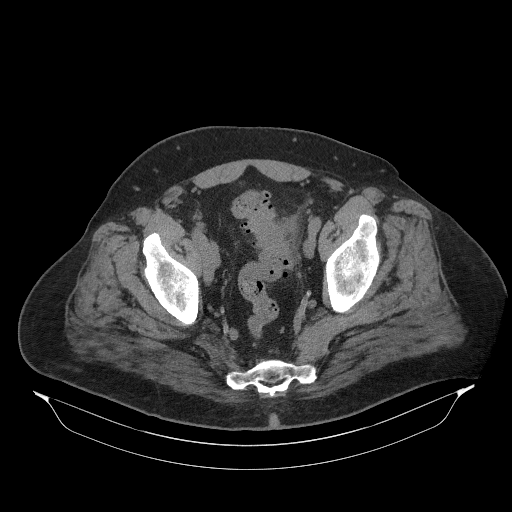
[im 67/188  soft-tissue]
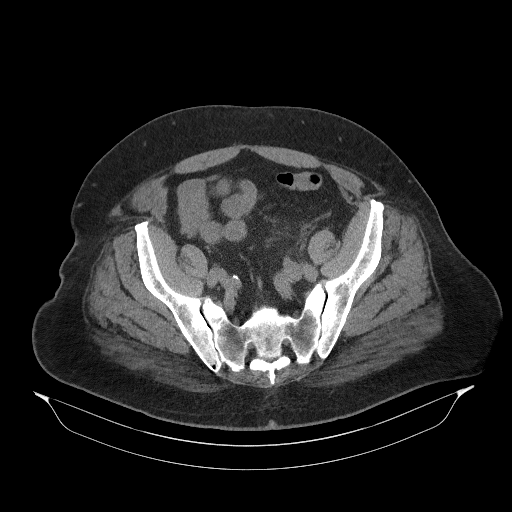
[im 79/188  soft-tissue]
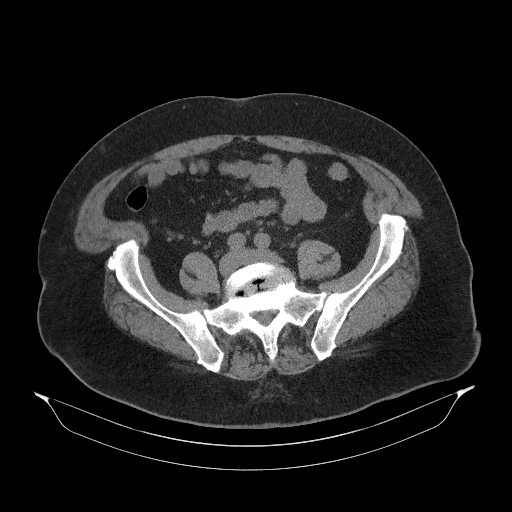
[im 97/188  soft-tissue]
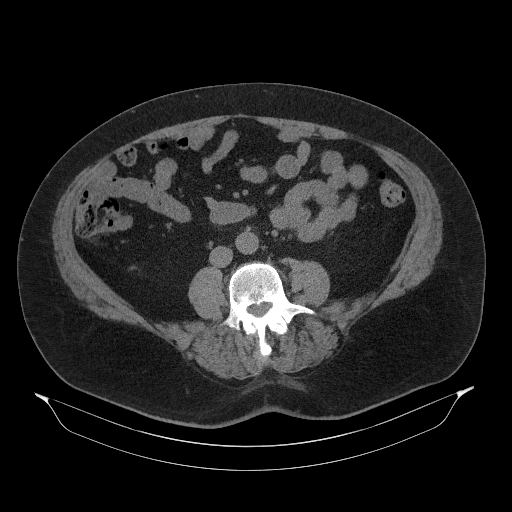
[im 109/188  soft-tissue]
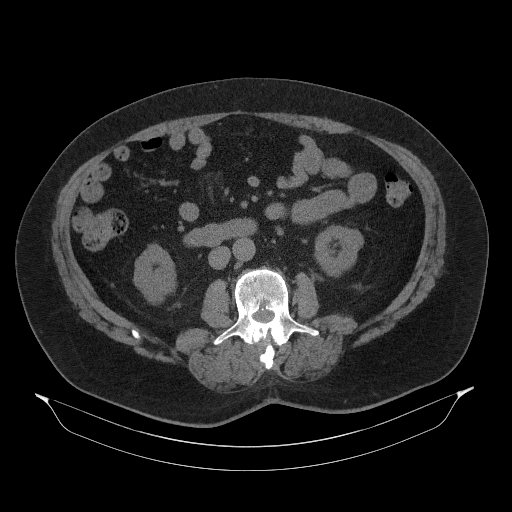
[im 121/188  soft-tissue]
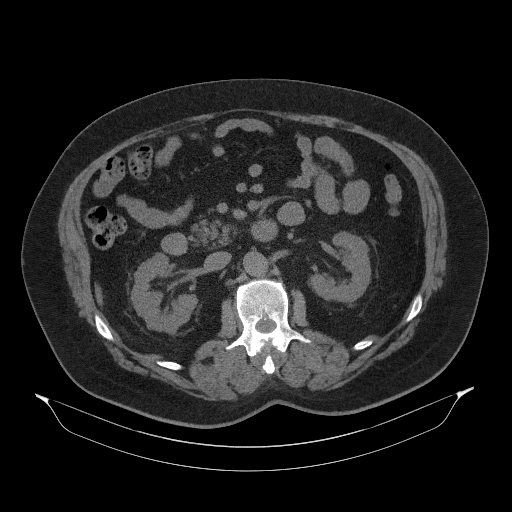
[im 121/188  bone]
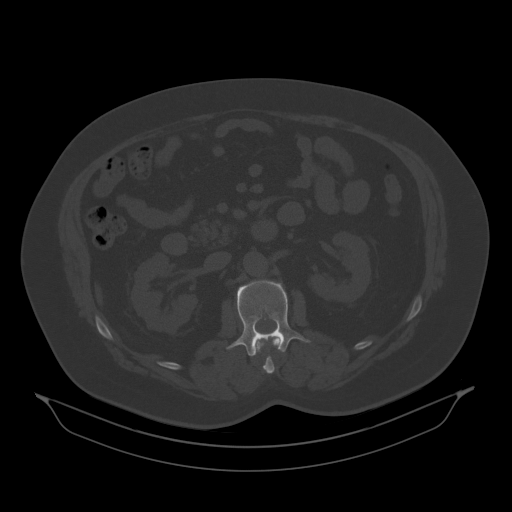
[im 133/188  soft-tissue]
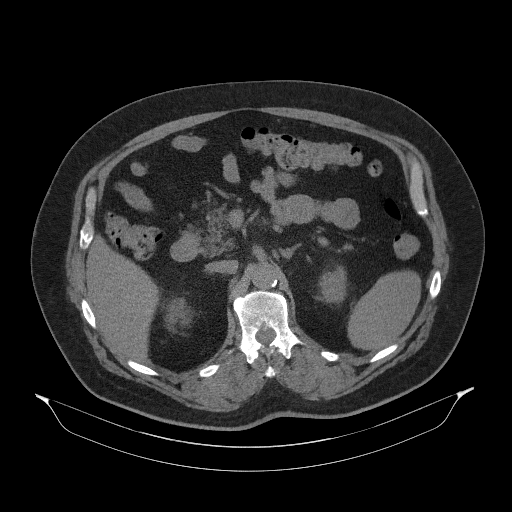
[im 151/188  soft-tissue]
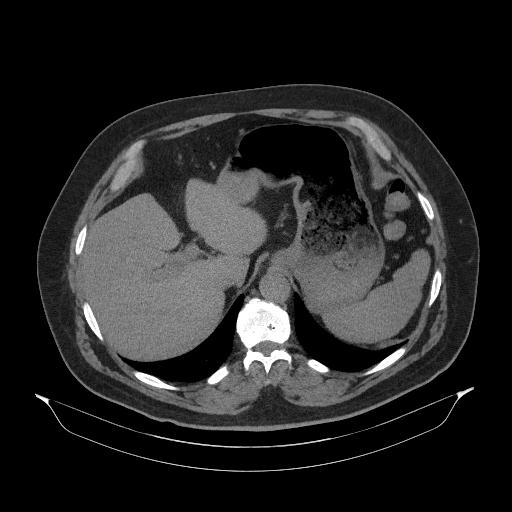
[im 163/188  soft-tissue]
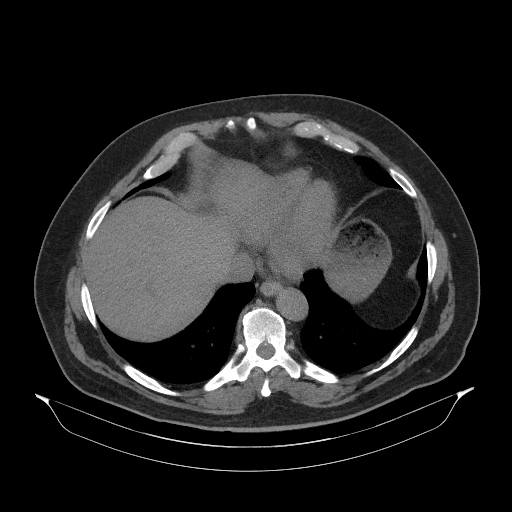
[im 175/188  soft-tissue]
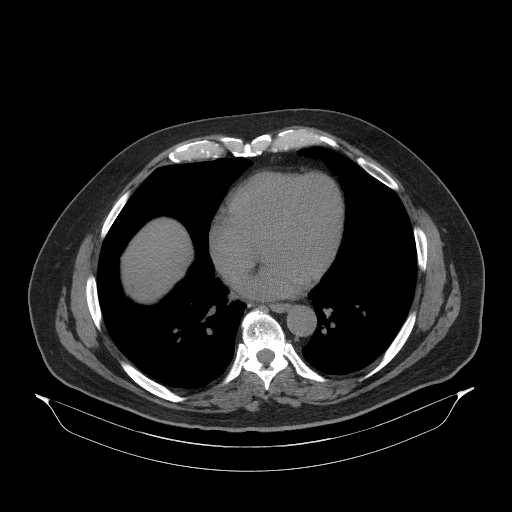

[Series 3: abd/pel cor wo · coronal · 1.00mm/px · 3 of 192 slices shown]
[im 64/192  soft-tissue]
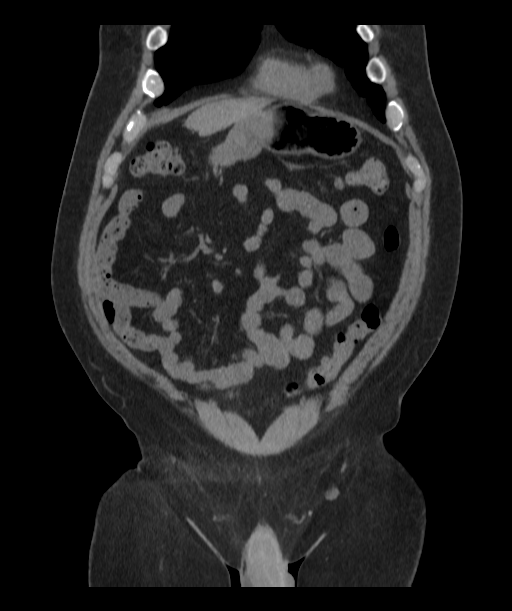
[im 85/192  soft-tissue]
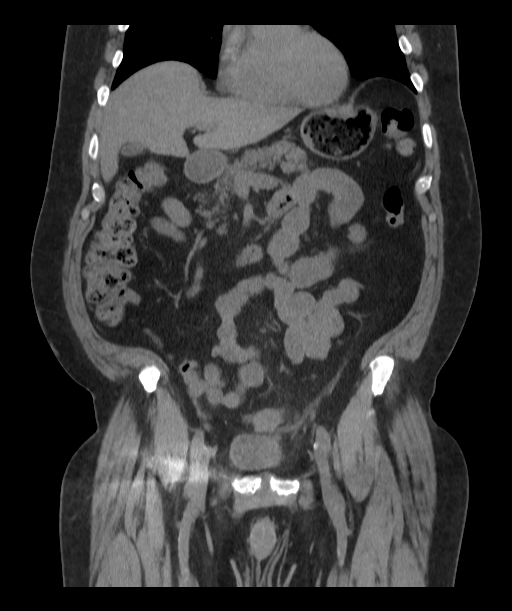
[im 107/192  soft-tissue]
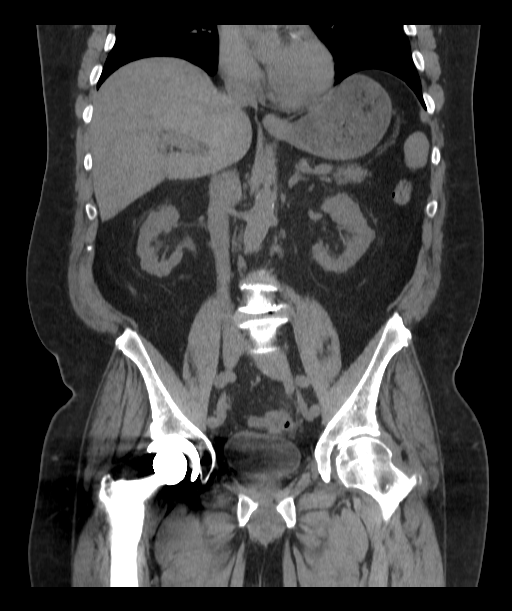

[16 of 46 positions shown; findings below may reference images not displayed]

FINDINGS: LUNG BASES: Lung bases are clear. No pleural effusions. Advanced coronary 
arterial calcific patient are partially included. 
HEPATOBILIARY: Non contrast images show no mass or biliary dilatation. No 
gallstones. 
SPLEEN: Normal in size. 
PANCREAS: No evidence for ductal dilatation or mass.   
ADRENALS: No mass. 
GENITOURINARY: No hydronephrosis or kidney stones. There are few prostate 
calcifications. Bladder is negative. 
LYMPH NODES: No adenopathy. 
STOMACH, SMALL BOWEL AND COLON: There are sigmoid diverticula with wall 
thickening or surrounding fluid/inflammatory stranding compatible with acute 
diverticulitis. No abscess. No perforation. The appendix is well seen and is 
negative. 
VASCULAR STRUCTURES: No aneurysm.  
MUSCULOSKELETAL: No acute osseous abnormality. Scattered degenerative changes. 
Right THA. 
ADDITIONAL FINDINGS: None.
IMPRESSION: Acute sigmoid diverticulitis without perforation or abscess. 
Findings were discussed with the referring service by myself at the time of this 
dictation. 
RADIATION DOSE REDUCTION: All CT scans are performed using radiation dose 
reduction techniques, when applicable.  Technical factors are evaluated and 
adjusted to ensure appropriate moderation of exposure.  Automated dose 
management technology is applied to adjust the radiation doses to minimize 
exposure while achieving diagnostic quality images.

## 2021-03-06 IMAGING — CT CT ABDOMEN AND PELVIS WITHOUT CONTRAST
2 of 4 series · 15 of 46 positions shown, 17 images · non-contrast
Comparison: CT abdomen pelvis November 21, 2020.

________________________________________________________________________________________________ 
CT ABDOMEN AND PELVIS WITHOUT CONTRAST, 03/06/2021 [DATE]: 
CLINICAL INDICATION: Lower abdominal pain and diarrhea without relief after 
antibiotics. History of diverticulitis. 
A search for DICOM formatted images was conducted for prior CT imaging studies 
completed at a non-affiliated media free facility.
TECHNIQUE: The abdomen and pelvis were scanned from lung bases through the 
pubic rami without contrast on a high-resolution CT scanner using dose reduction 
techniques. Routine MPR reconstructions were performed.

[Series 2: abd/pel ax wo · axial · 0.95mm/px · z∈[-720,-231]mm · 12 of 189 slices shown, 14 images]
[im 13/189  soft-tissue]
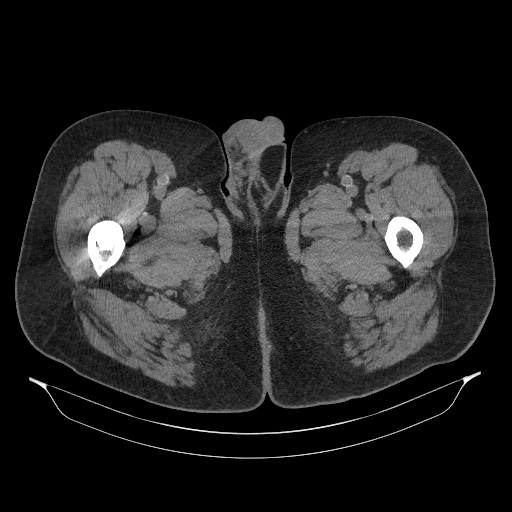
[im 13/189  bone]
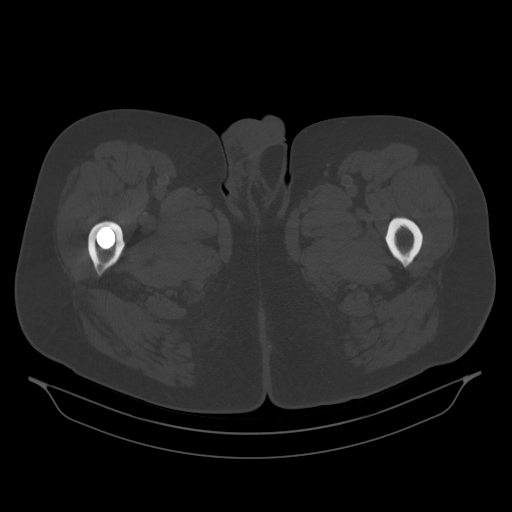
[im 26/189  soft-tissue]
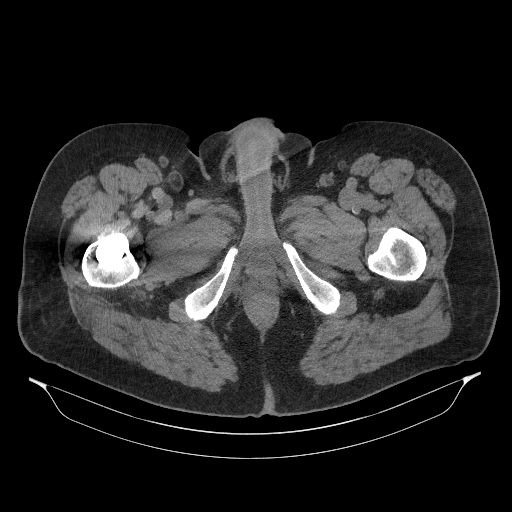
[im 38/189  soft-tissue]
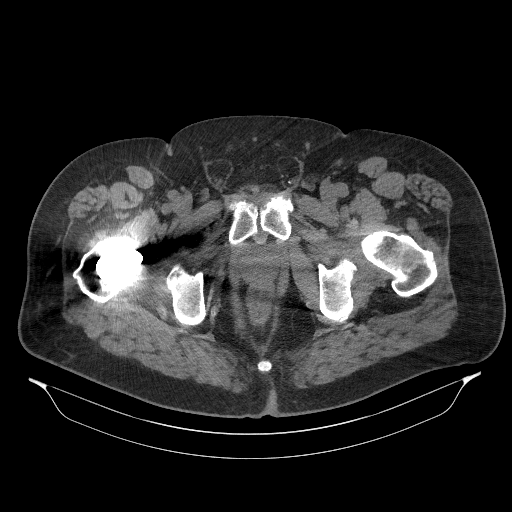
[im 63/189  soft-tissue]
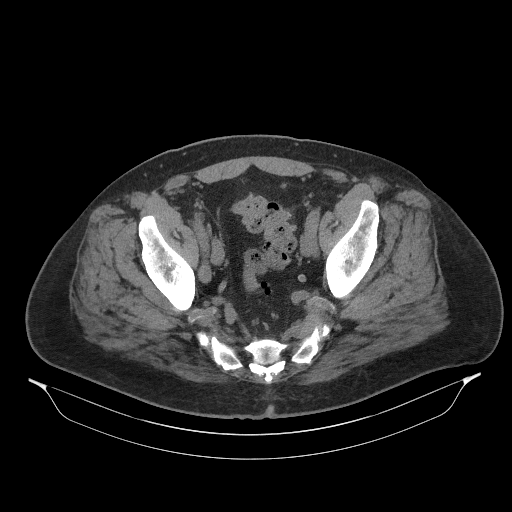
[im 76/189  soft-tissue]
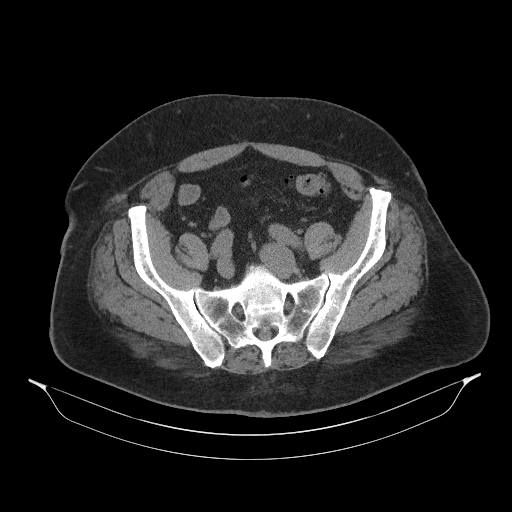
[im 88/189  soft-tissue]
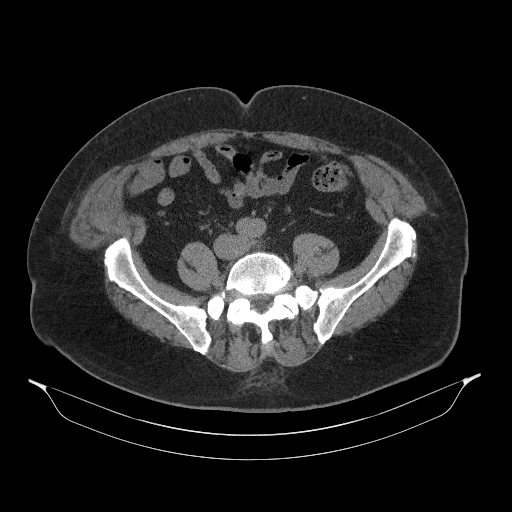
[im 101/189  soft-tissue]
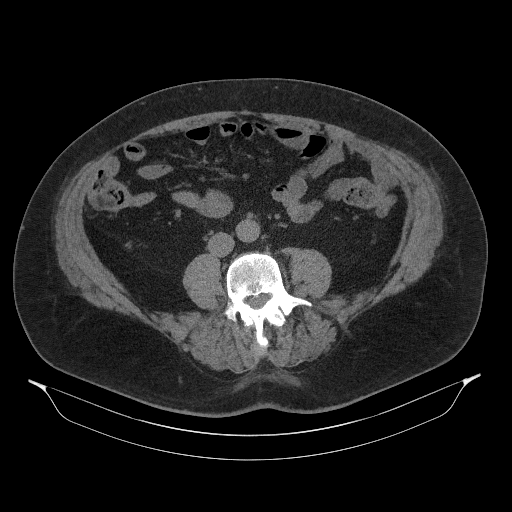
[im 113/189  soft-tissue]
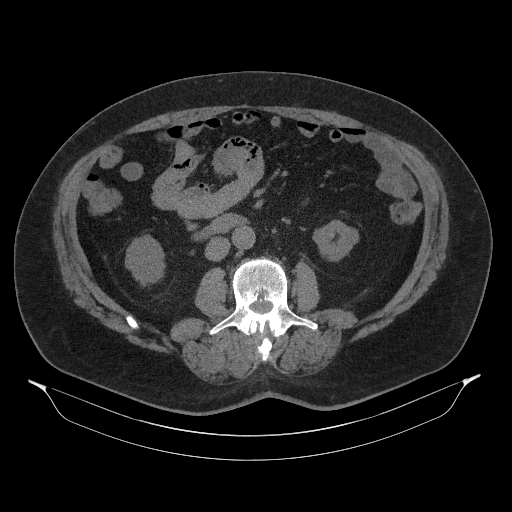
[im 126/189  soft-tissue]
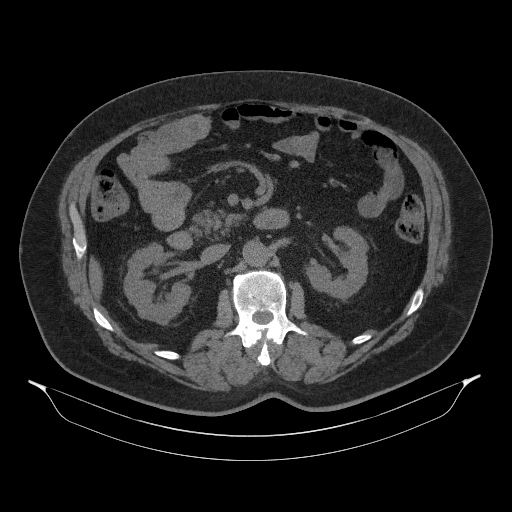
[im 126/189  bone]
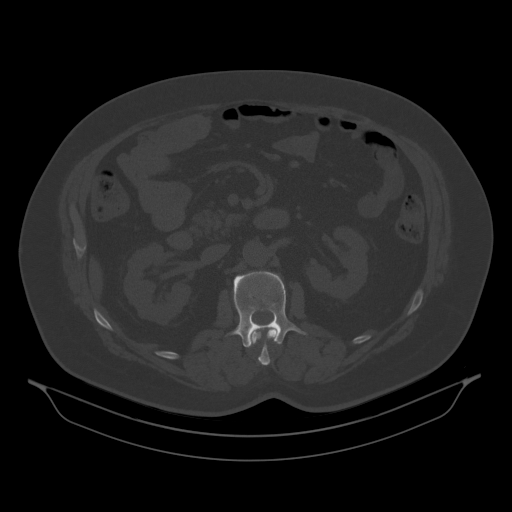
[im 151/189  soft-tissue]
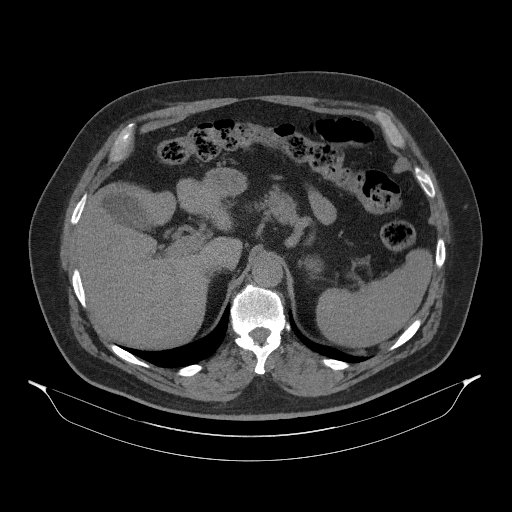
[im 163/189  soft-tissue]
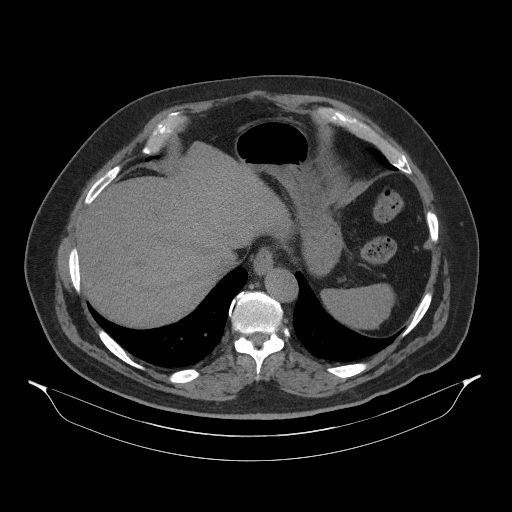
[im 176/189  soft-tissue]
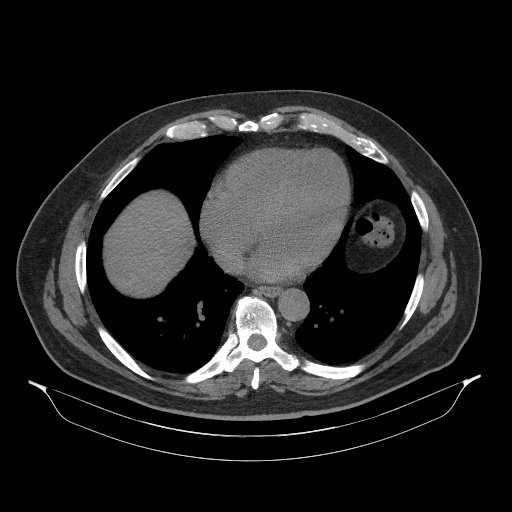

[Series 4: abd/pel cor wo · coronal · 0.94mm/px · 3 of 150 slices shown]
[im 50/150  soft-tissue]
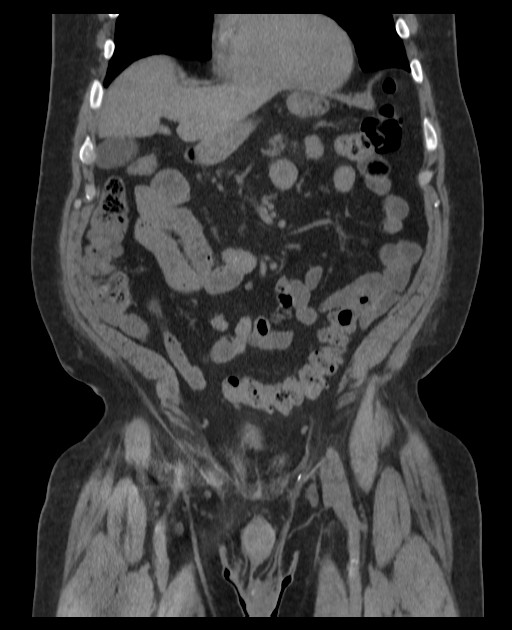
[im 67/150  soft-tissue]
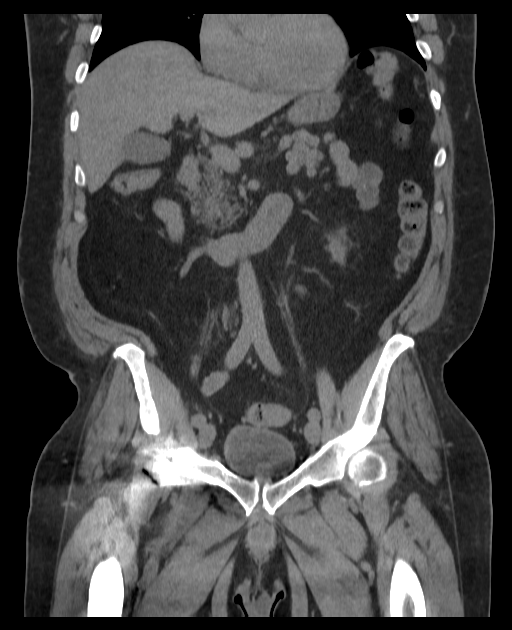
[im 83/150  soft-tissue]
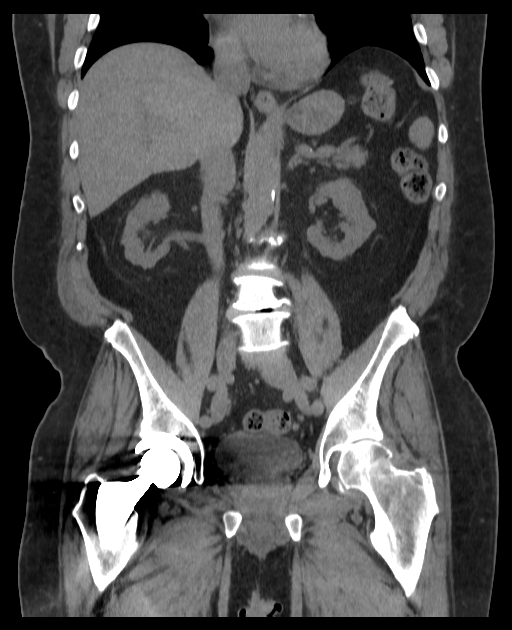

[15 of 46 positions shown; findings below may reference images not displayed]

FINDINGS: There is mild colonic diverticulosis most prominently in the sigmoid portion of 
the colon however there is no evidence for diverticulitis or other acute 
appearing colonic pathology. The appendix is normal. No small bowel pathology is 
found. The stomach is within normal limits. No renal pathology is detected. The 
urinary bladder is within normal limits in size but is not well seen inferiorly 
due to artifact from a RIGHT hip prosthesis.. The prostate shows mild 
enlargement but is not well seen due to artifact from a RIGHT hip prosthesis. 
Scattered calcified pelvic venous phleboliths are present. The liver, 
gallbladder, biliary tract, spleen and adrenal glands are unremarkable. The 
pancreas is normal except for a few punctate glandular calcifications. The 
abdominal aorta is normal in caliber with minimal scattered atherosclerotic 
calcifications. No abdominal or pelvic lymphadenopathy is present. There is no 
ascites. Small amounts of fat extend into the umbilicus. Degenerative changes 
are incidentally noted in the spine. There is a 3 mm nodule in the inferior 
RIGHT middle lobe on axial image 6 of series 2, stable from the prior exam. 
There is a 9 mm nodule in the LEFT lower lobe on axial image 10 of series 2, 
stable from the prior exam. Coronary artery calcifications are partially 
visible.
IMPRESSION: 1. No acute appearing abdominal or pelvic pathology is found to potentially 
explain abdominal pain and diarrhea. Specifically there is no evidence for 
diverticulitis. 
2. Small nodules in the lung bases as described above, unchanged from November 21, 2020. 
RADIATION DOSE REDUCTION: All CT scans are performed using radiation dose 
reduction techniques, when applicable.  Technical factors are evaluated and 
adjusted to ensure appropriate moderation of exposure.  Automated dose 
management technology is applied to adjust the radiation doses to minimize 
exposure while achieving diagnostic quality images.

## 2021-03-25 IMAGING — CT CT CHEST/ABDOMEN AND PELVIS WITHOUT CONTR
1 of 3 series · 12 of 32 positions shown, 17 images · non-contrast
Comparison: CT abdomen and pelvis March 06, 2021 and November 21, 2020. CT chest 
April 24, 2020. PET CT March 31, 2020.  CT chest September 20, 2019.

________________________________________________________________________________________________ 
CT CHEST/ABDOMEN AND PELVIS WITHOUT CONTR, 03/25/2021 [DATE]: 
(Films were taken at [facility].)  
CLINICAL INDICATION:  65-year-old male with thyroid cancer. No chemotherapy or 
radiation therapy. 
A search for DICOM formatted images was conducted for prior CT imaging studies 
completed at a non-affiliated media free facility.
TECHNIQUE: The region of interest was scanned without contrast on a high 
resolution CT scanner.  Routine MPR reconstructions were performed.

[Series 2: cap wo · axial · 0.98mm/px · z∈[+488,+1208]mm · 12 of 272 slices shown, 17 images]
[im 16/272  soft-tissue]
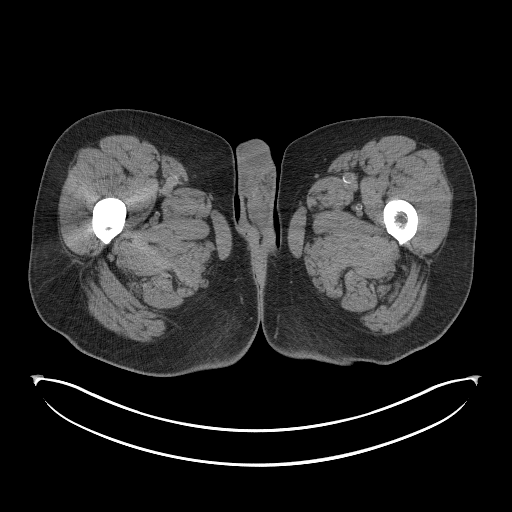
[im 16/272  bone]
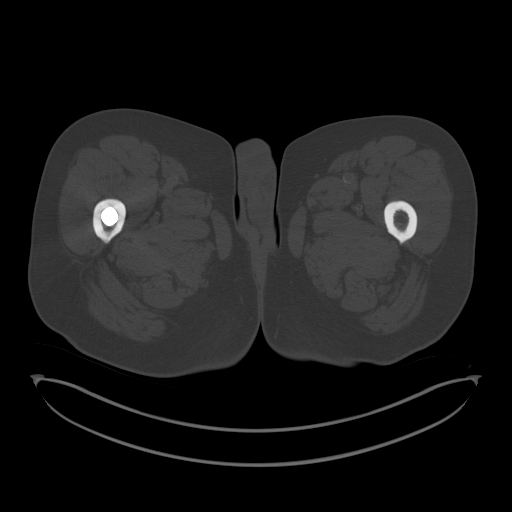
[im 46/272  soft-tissue]
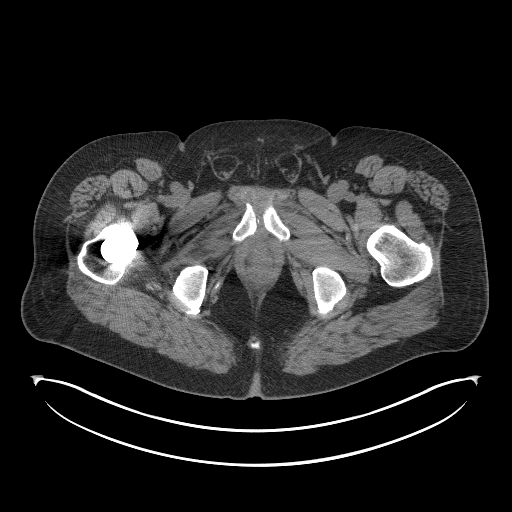
[im 61/272  soft-tissue]
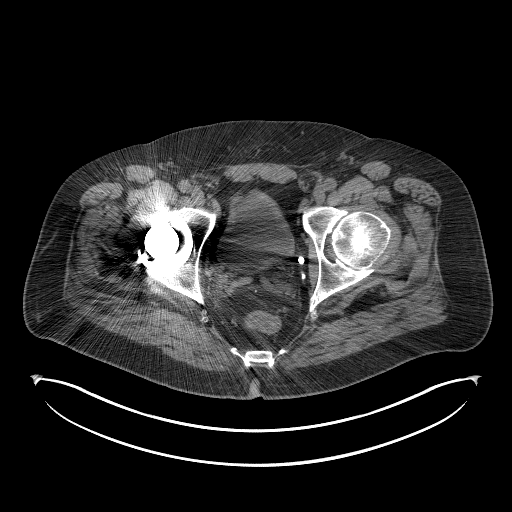
[im 91/272  soft-tissue]
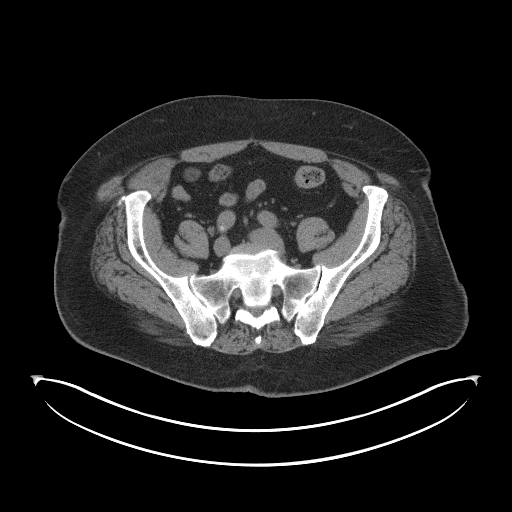
[im 106/272  soft-tissue]
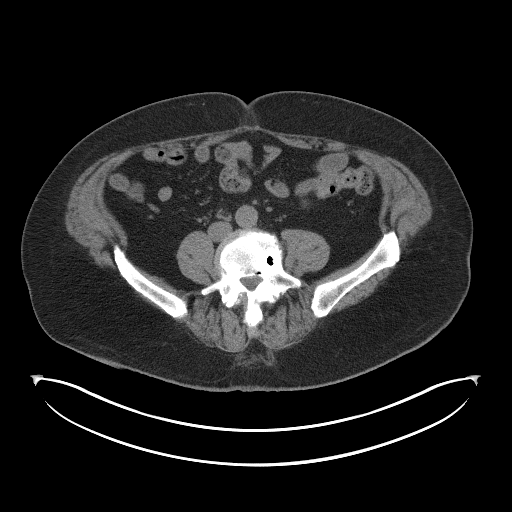
[im 136/272  soft-tissue]
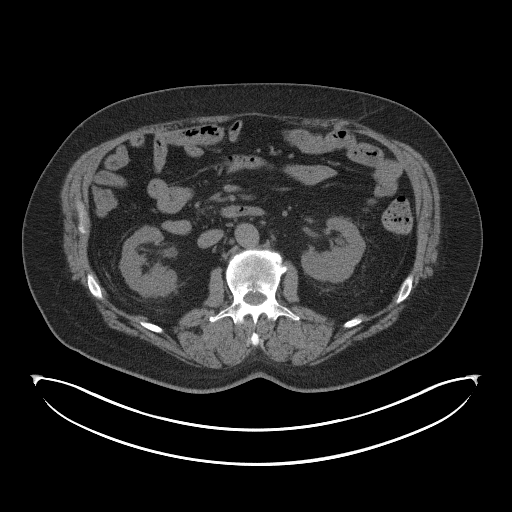
[im 166/272  soft-tissue]
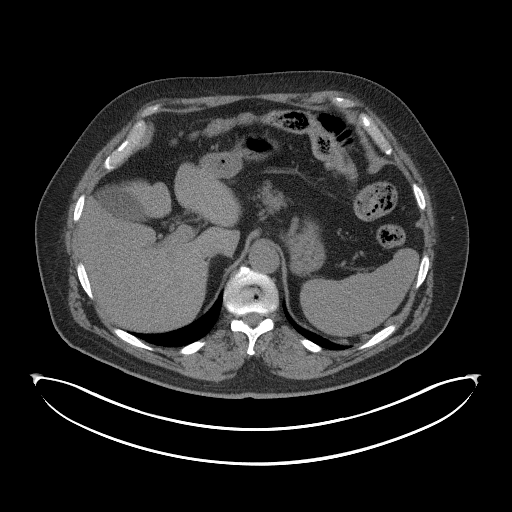
[im 181/272  soft-tissue]
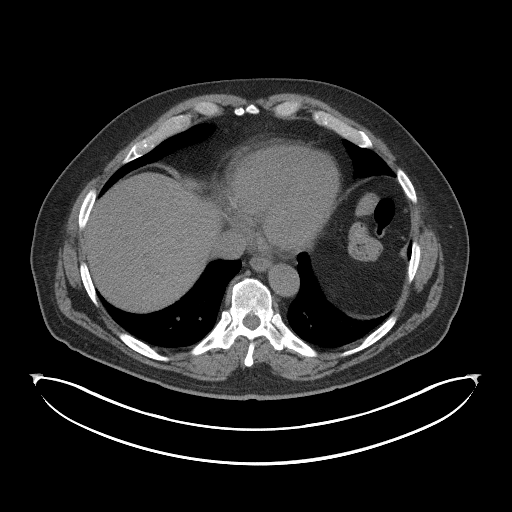
[im 211/272  soft-tissue]
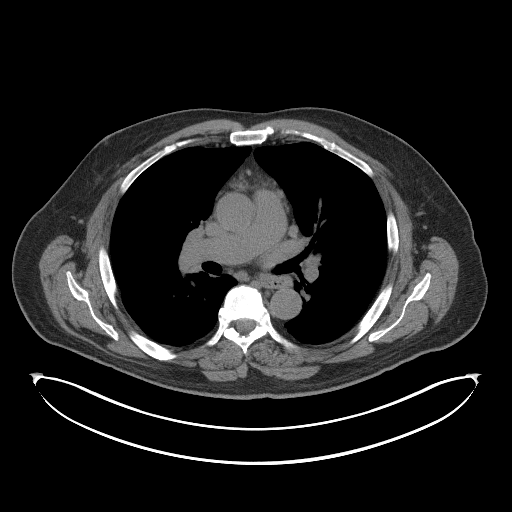
[im 211/272  lung]
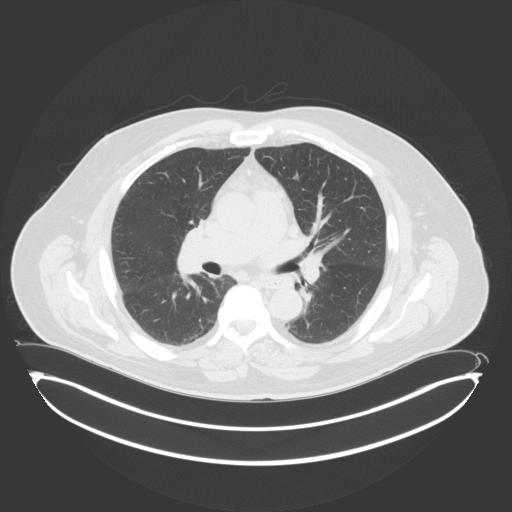
[im 211/272  bone]
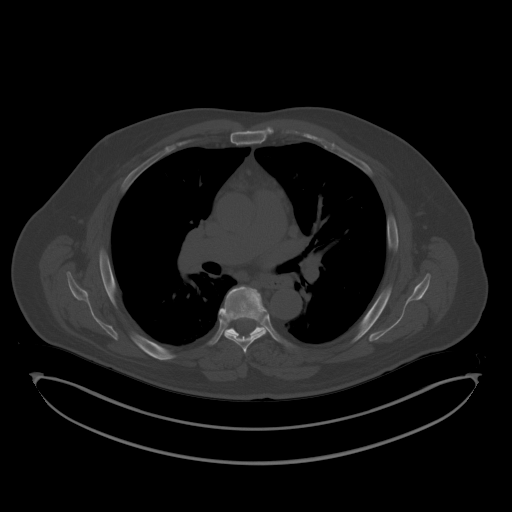
[im 226/272  soft-tissue]
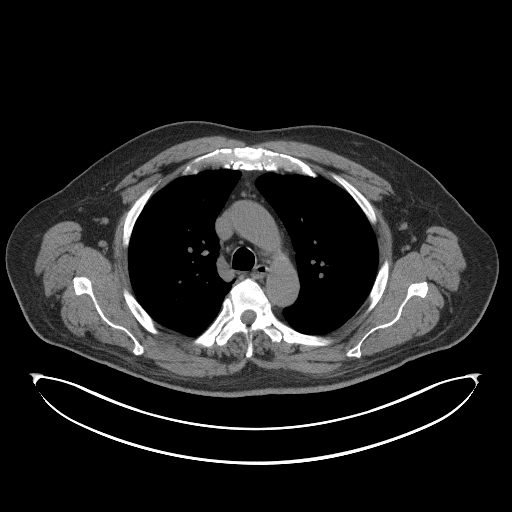
[im 226/272  lung]
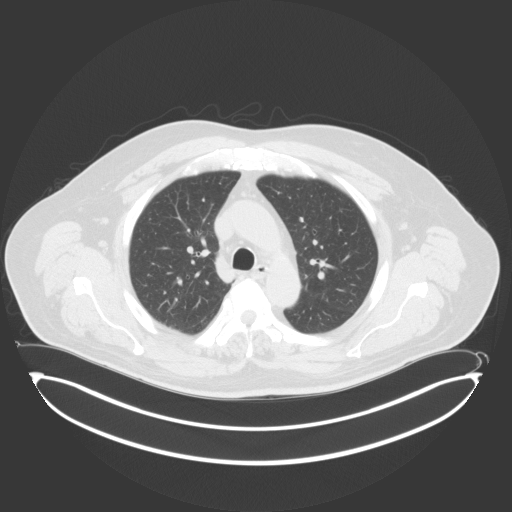
[im 241/272  lung]
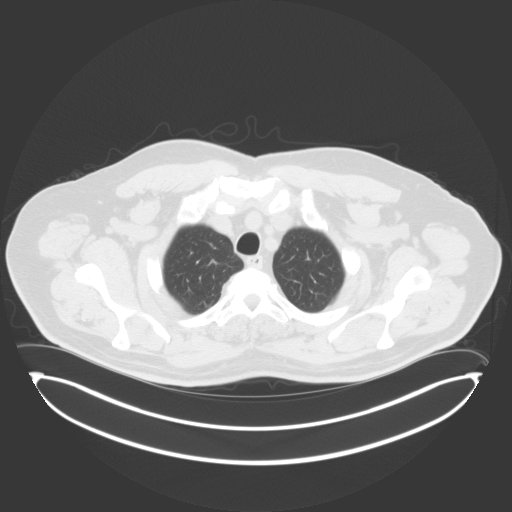
[im 256/272  soft-tissue]
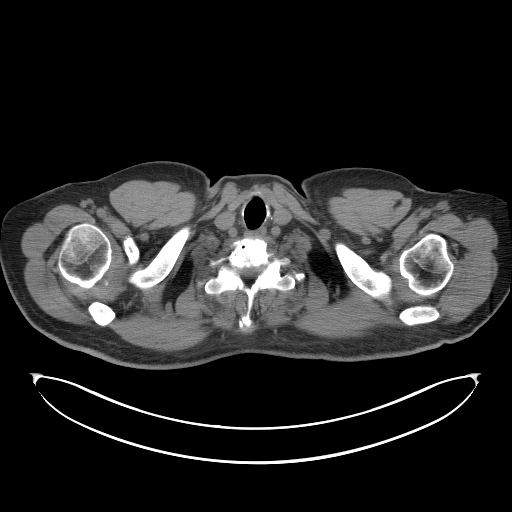
[im 256/272  lung]
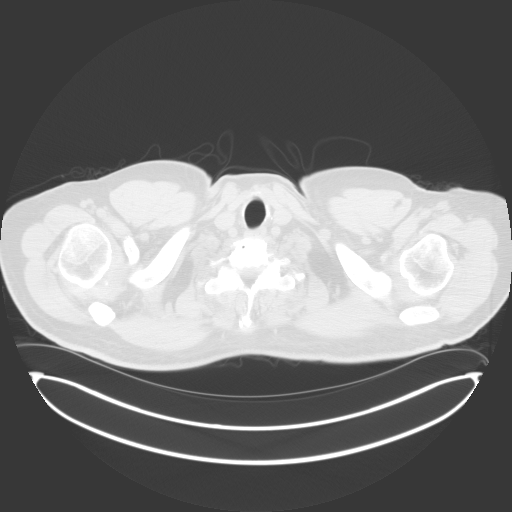

[12 of 32 positions shown; findings below may reference images not displayed]

FINDINGS: LUNGS AND PLEURA:  There is a 9 mm nodule left lower lobe. On September 20, 2019 
study, this nodule measured 8 mm, not significantly changed.Stable 3 mm nodule 
right upper lobe image 41 series 3. Stable 2 mm right upper lobe pulmonary 
nodule image 53. Stable 5 mm subpleural nodule right middle lobe. There is a 2 
mm nodule right middle lobe image 67 series 3 which is stable to study from September 20, 2019. There is a 1 to 2 mm nodule at the left medial lung apex image 27 
series 3 which was subtly visualized on September 20, 2019 is and does not appear 
appreciably changed. No pleural effusion. 
MEDIASTINUM:  Extensive coronary artery calcification. There is a 
stable-appearing marbled type appearance within the anterior mediastinum. A more 
focal area of soft tissue attenuation anterior to the main pulmonary artery 
measures 11 x 10 mm on todays study, previously 14 x 13 mm on September 20, 2019. A 
stable subcarinal lymph node measuring 16 x 10 mm. Normal heart size. No 
pericardial effusion.  
CHEST WALL/AXILLA: No mass or adenopathy.  
HEPATOBILIARY: Non contrast images show no evidence for mass or biliary 
dilatation. No gallstones. 
SPLEEN: Normal in size. 
PANCREAS: No ductal dilatation or mass.   
ADRENALS: No mass. 
GENITOURINARY: No stones or hydronephrosis. Streak artifact from right hip 
arthroplasty slightly degrades evaluation of the bladder region although there 
appears to be moderate diffuse bladder wall thickening with moderate enlargement 
of the prostate. Stable low attenuation region posterior left kidney image 139 
series 2 could reflect a cyst but is ultimately indeterminate on this 
noncontrast study. 
LYMPH NODES: No adenopathy. 
STOMACH, SMALL BOWEL AND COLON: Colonic diverticulosis. 
VASCULAR STRUCTURES: No aneurysm.  
MUSCULOSKELETAL: No acute osseous abnormality. Scattered degenerative changes. 
Bilateral fat-containing inguinal hernias. 
Small fat-containing periumbilical hernia. Right hip arthroplasty. Stable 
sclerosis about both SI joints. Postsurgical changes right humeral head. 
ADDITIONAL FINDINGS: None.
IMPRESSION: Stable exam. 
Stable subcentimeter pulmonary nodules as detailed above. 
Stable appearance within the anterior mediastinum, consisting of a marble type 
appearance. There is a stable presumed lymph node anterior to the main pulmonary 
artery. 
RADIATION DOSE REDUCTION: All CT scans are performed using radiation dose 
reduction techniques, when applicable.  Technical factors are evaluated and 
adjusted to ensure appropriate moderation of exposure.  Automated dose 
management technology is applied to adjust the radiation doses to minimize 
exposure while achieving diagnostic quality images.

## 2022-02-08 IMAGING — DX RIBS RIGHT WITH PA CHEST 3 VIEW
1 series · 5 of 5 positions shown · non-contrast
Comparison: None.

________________________________________________________________________________________________ 
RIBS RIGHT WITH PA CHEST 3 VIEW, 02/08/2022 [DATE]: 
CLINICAL INDICATION: Pleurodynia.

[Series 1: PA · U · 0.14mm/px · 5 of 5 slices shown]
[im 1/5]
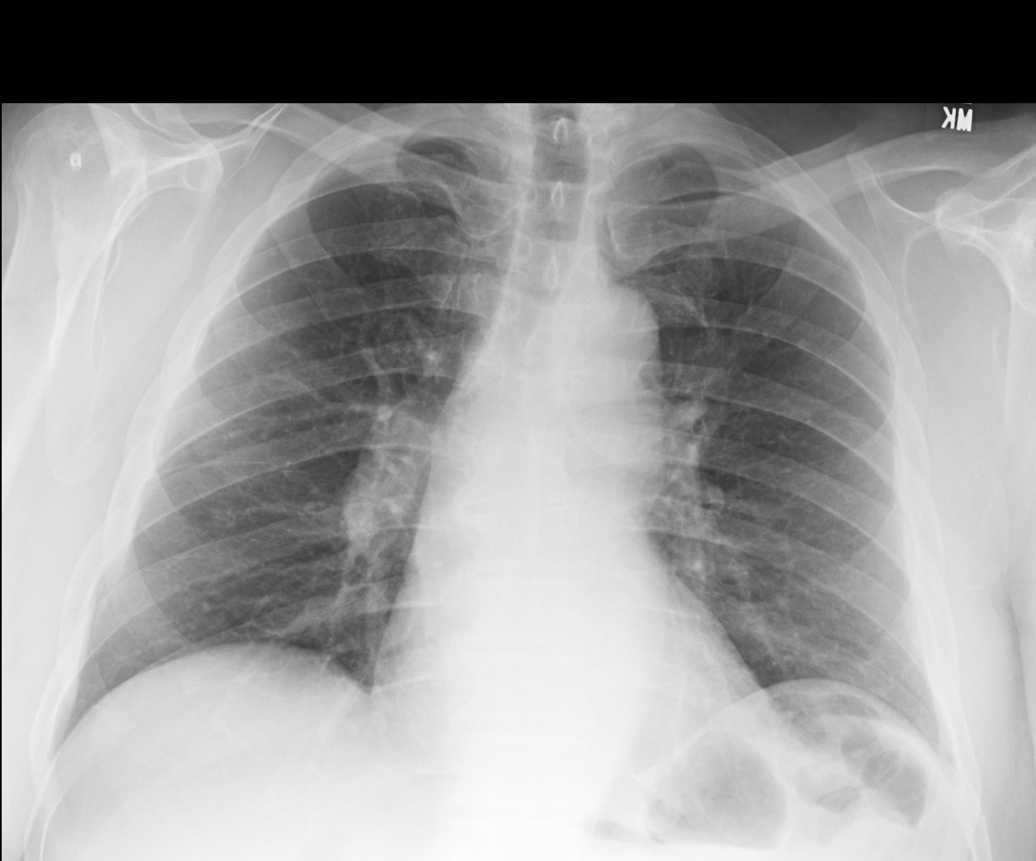
[im 2/5]
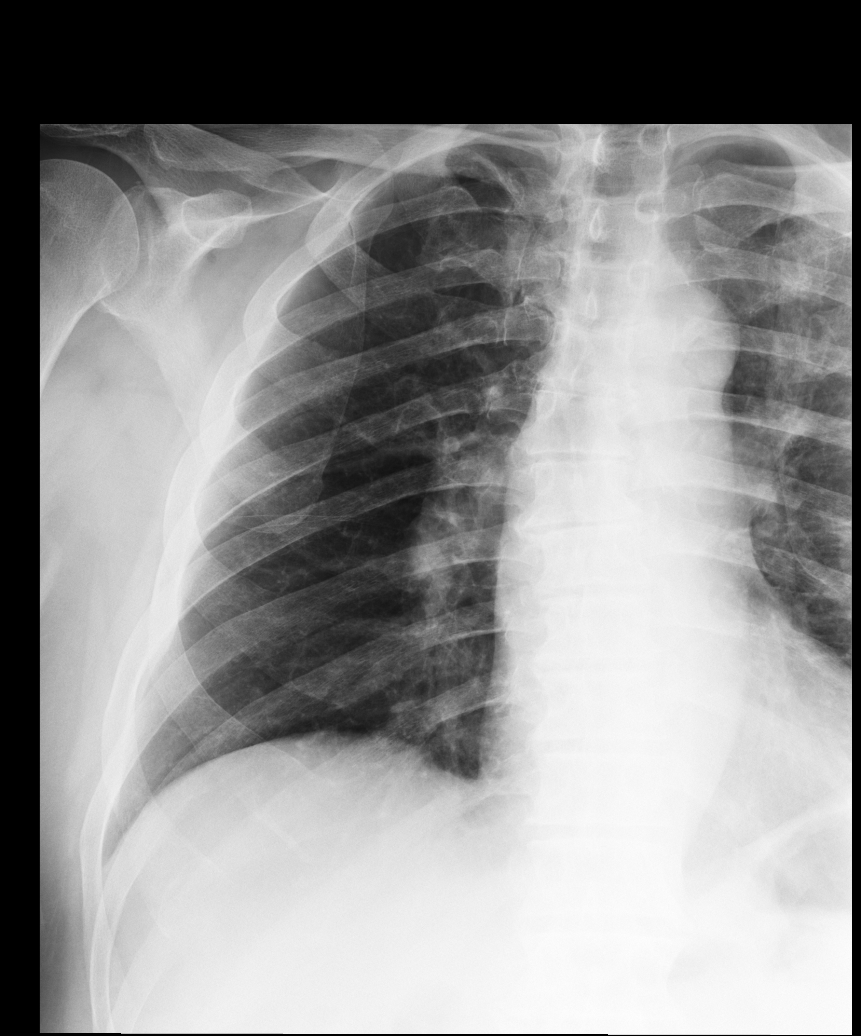
[im 3/5]
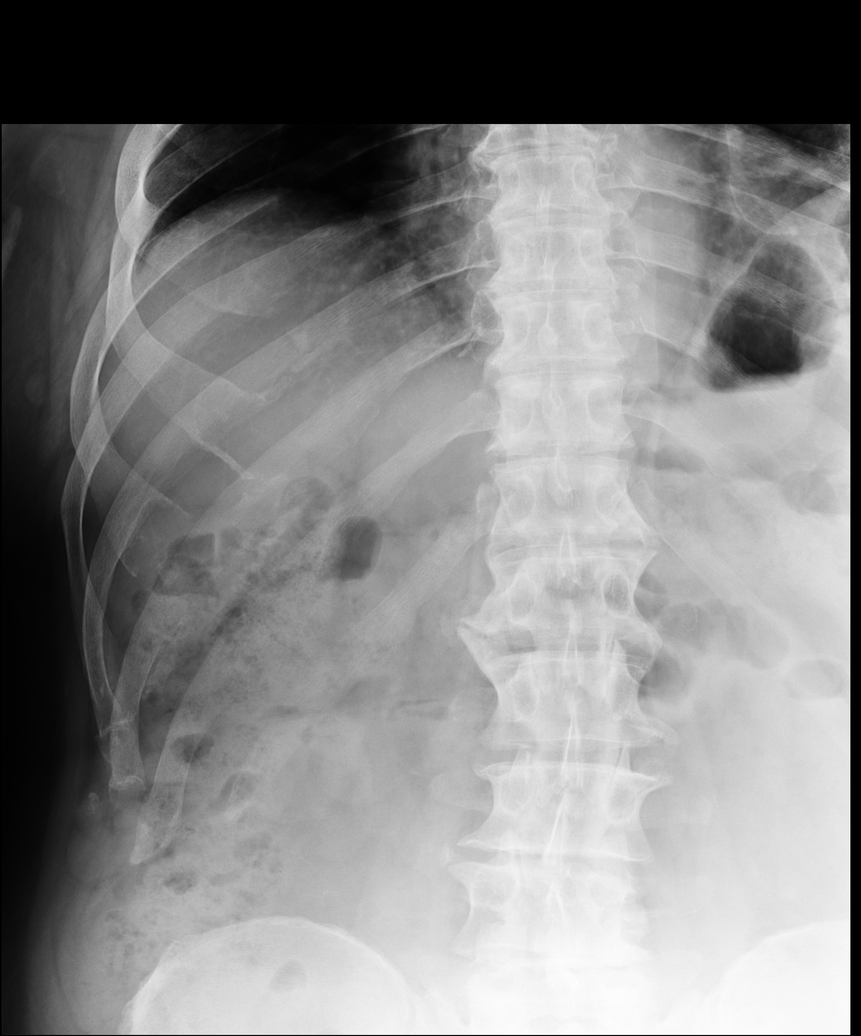
[im 4/5]
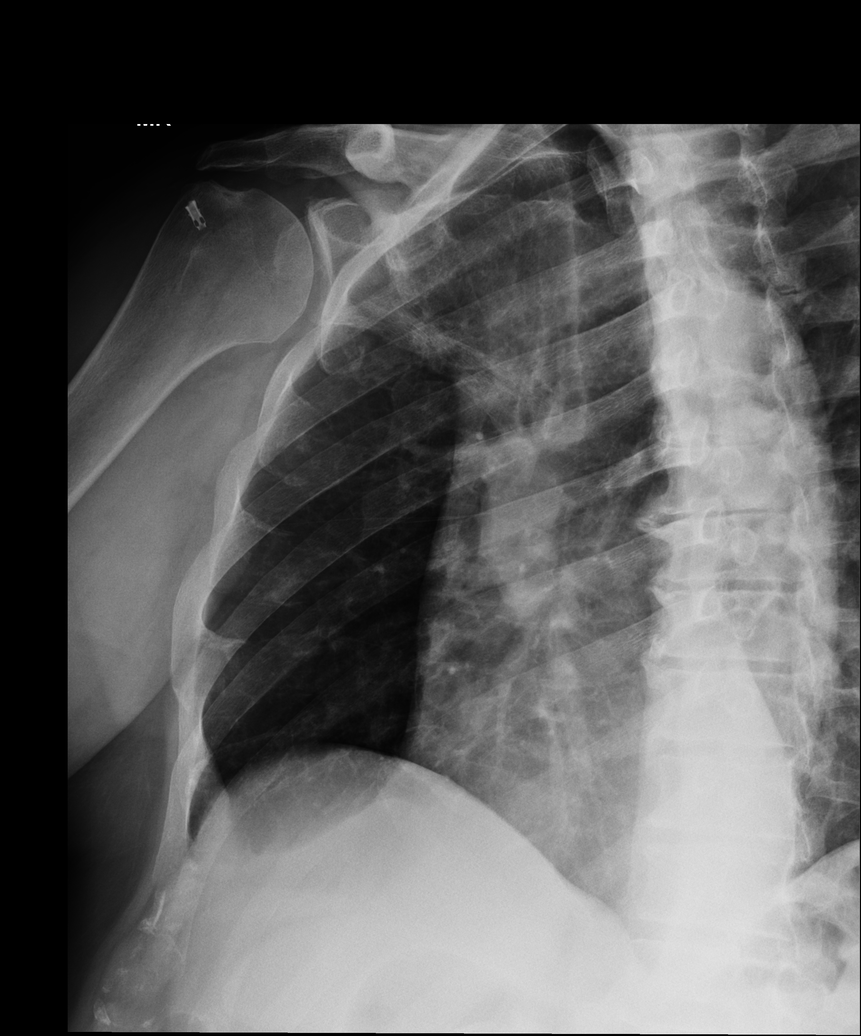
[im 5/5]
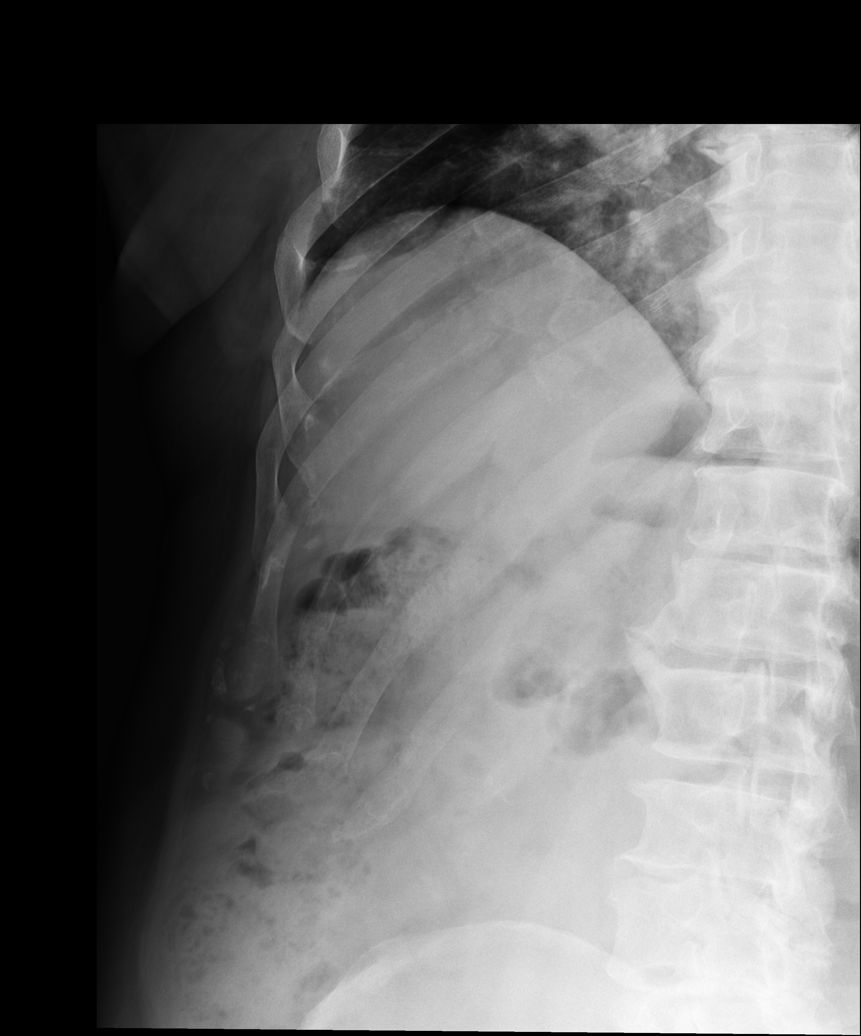

[5 of 5 positions shown; findings below may reference images not displayed]

FINDINGS: Postoperative changes right shoulder. Mild atherosclerotic changes and 
mild degenerative changes. No mass or consolidation. No pleural effusion seen. 
No rib fracture or destructive changes seen.
IMPRESSION: No acute abnormality seen to correlate to the patients right-sided pain.

## 2022-03-25 IMAGING — CT CT CHEST/ABDOMEN AND PELVIS WITHOUT CONTRAST
1 of 3 series · 13 of 32 positions shown, 18 images · non-contrast
Comparison: CT scan March 2021

________________________________________________________________________________________________ 
CT CHEST/ABDOMEN AND PELVIS WITHOUT CONTRAST, 03/25/2022 [DATE]: 
CLINICAL INDICATION: Lymphadenopathy within the groin. History of thyroid 
carcinoma. 
A search for DICOM formatted images was conducted for prior CT imaging studies 
completed at a non-affiliated media free facility.
TECHNIQUE: The chest, abdomen and pelvis were scanned from base of neck through 
the pubic rami without contrast on a high resolution CT scanner. Routine MPR 
reconstructions were performed.  Count of known CT and Cardiac Nuclear Medicine 
studies performed in the previous 12 months = 0

[Series 2: cap wo · axial · 0.95mm/px · z∈[-878,-160]mm · 13 of 271 slices shown, 18 images]
[im 16/271  soft-tissue]
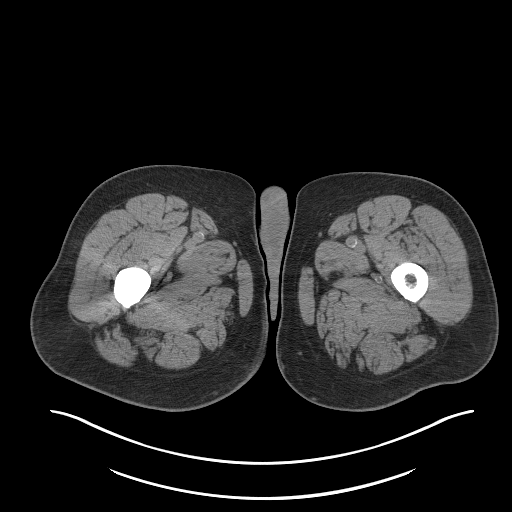
[im 16/271  bone]
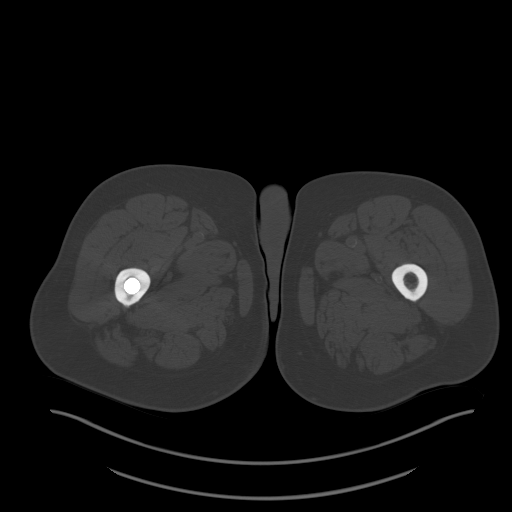
[im 48/271  soft-tissue]
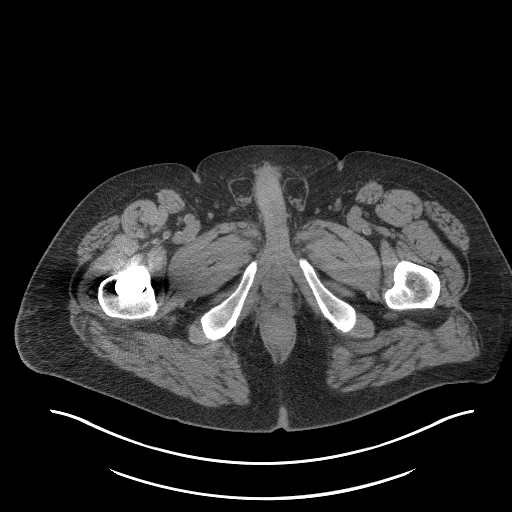
[im 64/271  soft-tissue]
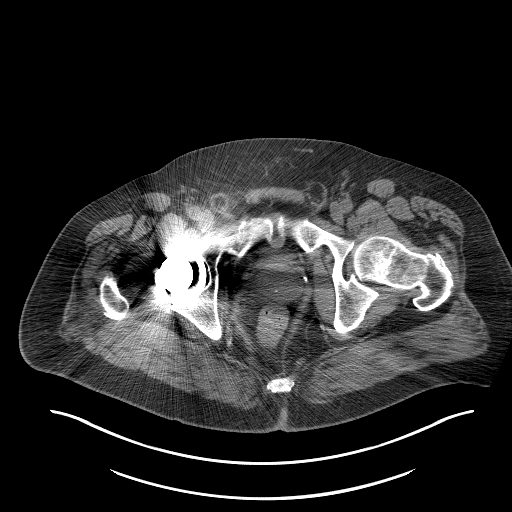
[im 80/271  soft-tissue]
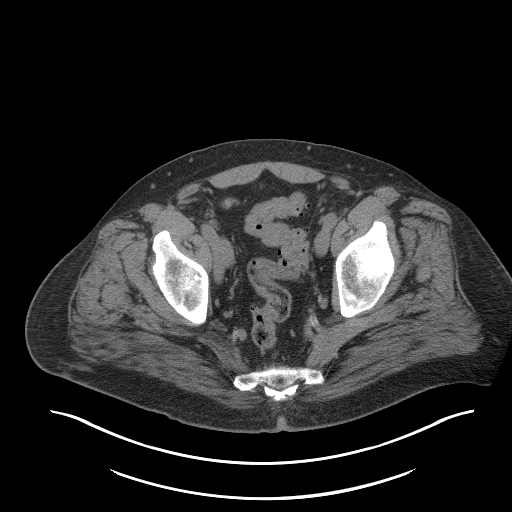
[im 112/271  soft-tissue]
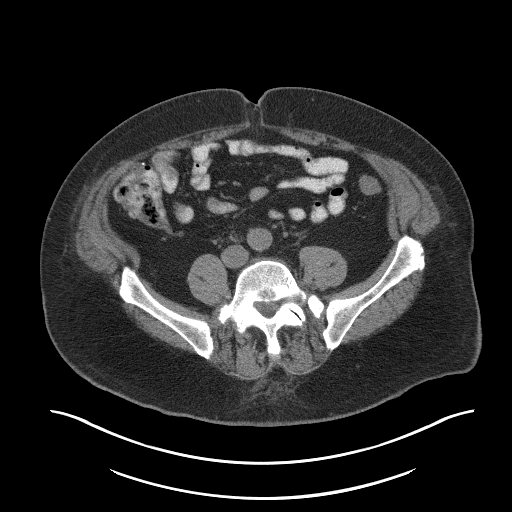
[im 128/271  soft-tissue]
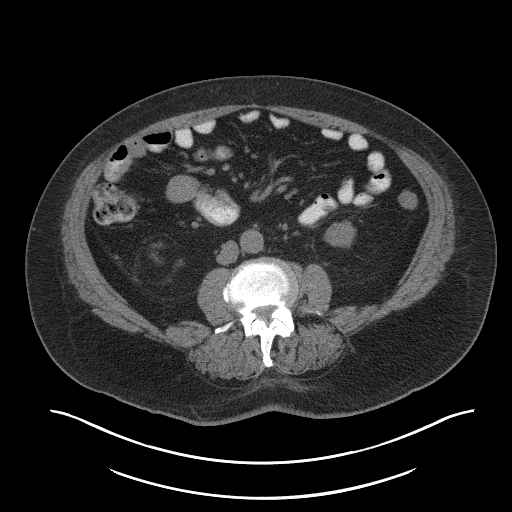
[im 143/271  soft-tissue]
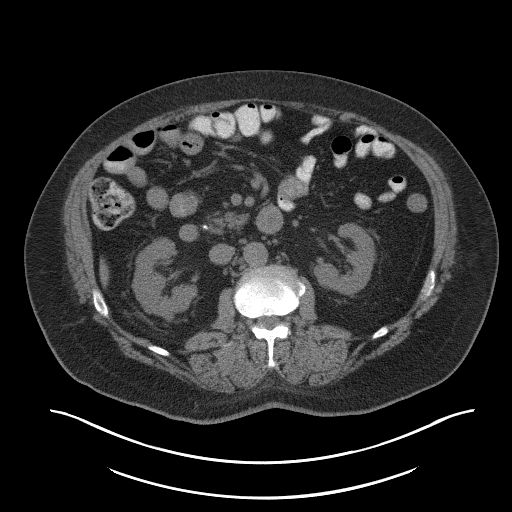
[im 175/271  soft-tissue]
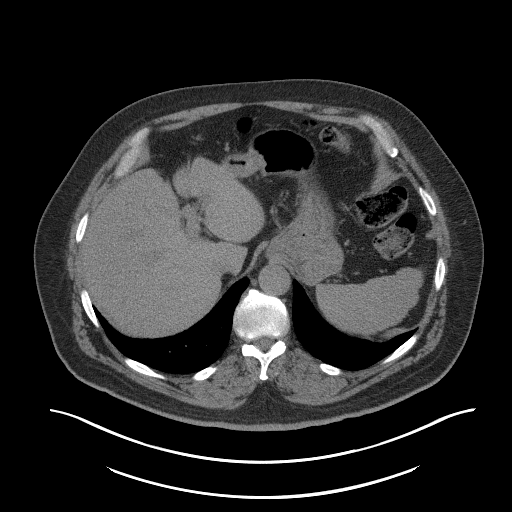
[im 191/271  soft-tissue]
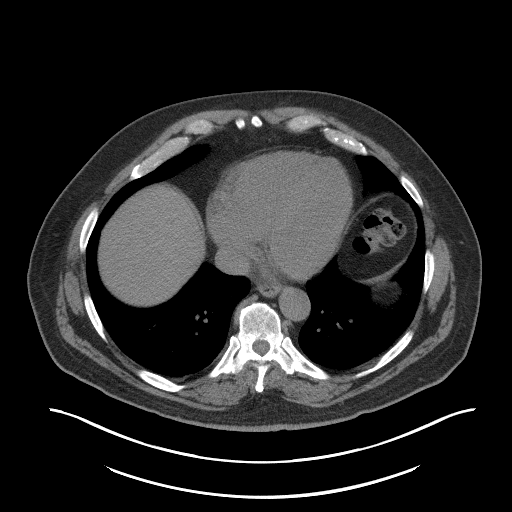
[im 191/271  bone]
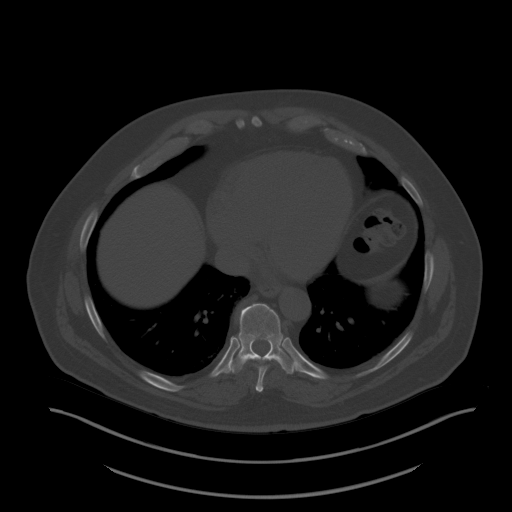
[im 207/271  soft-tissue]
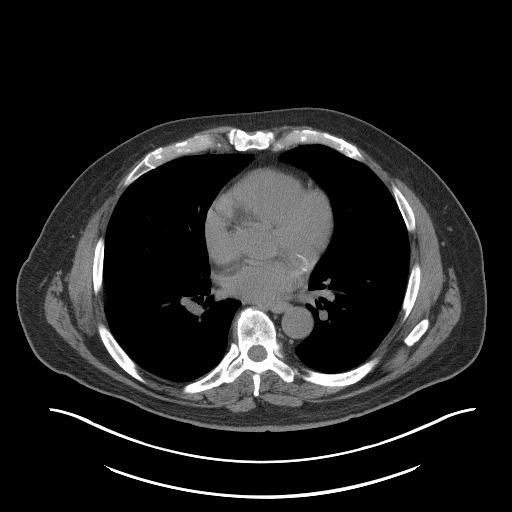
[im 207/271  lung]
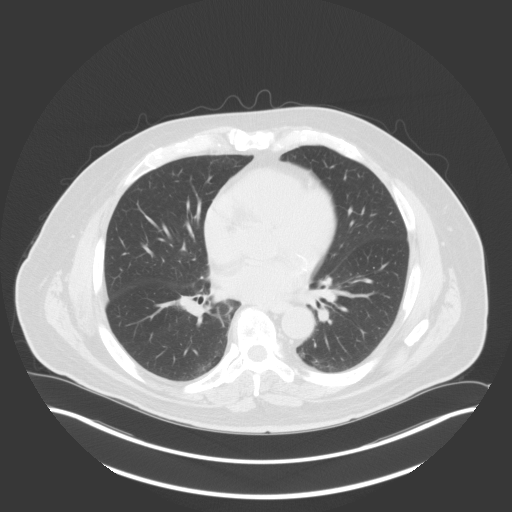
[im 223/271  lung]
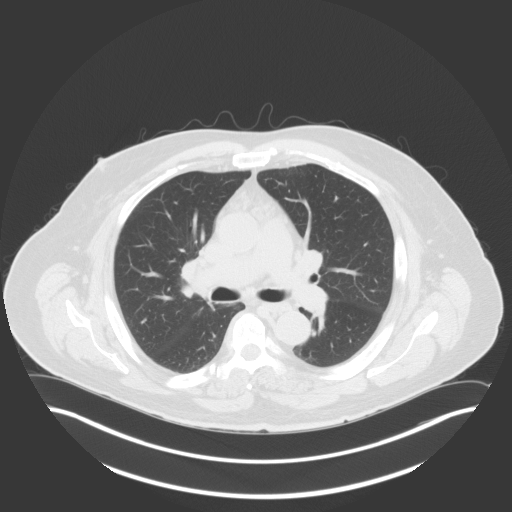
[im 239/271  soft-tissue]
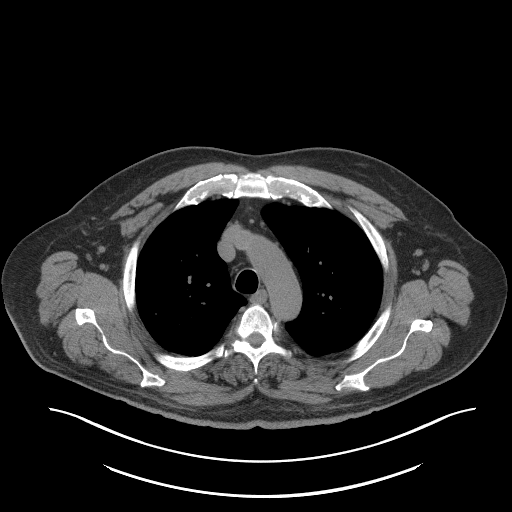
[im 239/271  lung]
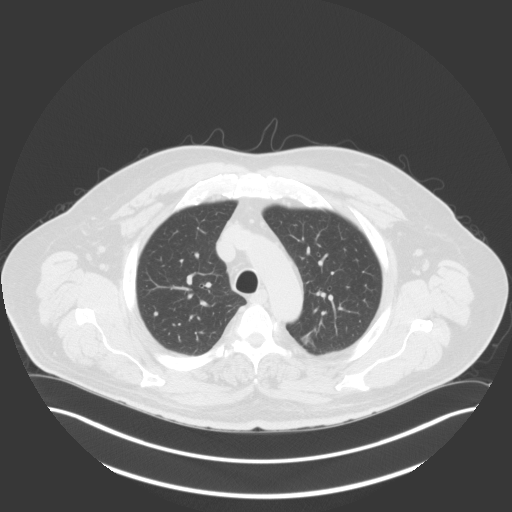
[im 255/271  soft-tissue]
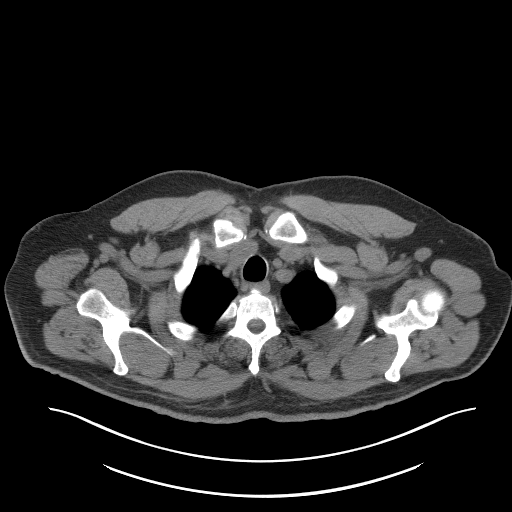
[im 255/271  lung]
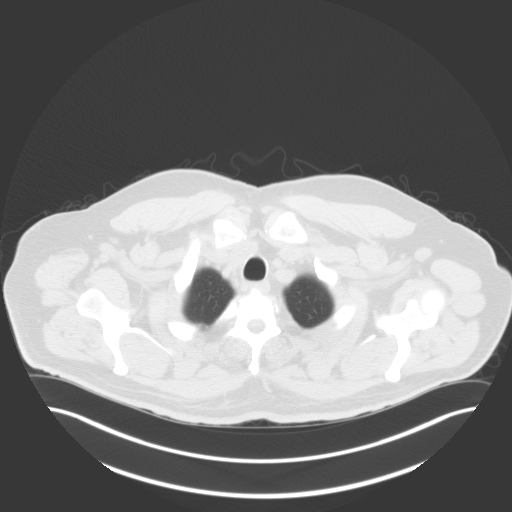

[13 of 32 positions shown; findings below may reference images not displayed]

FINDINGS: LUNGS AND PLEURA:  Multiple scattered nodules are again seen and unchanged since 
March 2021. The largest is posteriorly in the left lower lobe currently seen 
on image 74 at just under 8 mm. This is stable compared to prior exam all 
consistent with benign disease. No pleural effusion identified. 
MEDIASTINUM:  The previously described marbled appearance to the anterior 
mediastinum is again seen with numerous soft tissue foci scattered throughout 
the anterior mediastinum without mass effect. In a younger patient this would be 
thought to be residual thymus which could be unusual in an older patient. 
Regardless this has been stable since April 2020 thought to be consistent with 
benign disease. No new mass or adenopathy. At least moderate coronary 
calcifications 
CHEST WALL/AXILLA: No mass or adenopathy.  
HEPATOBILIARY: Non contrast images show no evidence for mass or biliary 
dilatation. No gallstones. 
SPLEEN: Normal in size. 
PANCREAS: No ductal dilatation or mass.   
ADRENALS: No mass. 
GENITOURINARY: No stones or hydronephrosis. No bladder mass. 
LYMPH NODES: No adenopathy. 
STOMACH, SMALL BOWEL AND COLON: No bowel wall thickening or obstruction. 
VASCULAR STRUCTURES: Atherosclerotic changes without aneurysmal dilatation.  
MUSCULOSKELETAL: No acute osseous abnormality. Scattered degenerative changes.  
ADDITIONAL FINDINGS: Postop changes right hip.
IMPRESSION: Stable appearance the anterior mediastinum since at least April 2020  
consistent with benign findings. Stable scattered micronodules also consistent 
with benign findings. 
No new or suspicious abnormality seen elsewhere. 
RADIATION DOSE REDUCTION: All CT scans are performed using radiation dose 
reduction techniques, when applicable.  Technical factors are evaluated and 
adjusted to ensure appropriate moderation of exposure.  Automated dose 
management technology is applied to adjust the radiation doses to minimize 
exposure while achieving diagnostic quality images.

## 2023-05-11 IMAGING — CT CT CHEST WITHOUT CONTRAST
2 of 4 series · 15 of 36 positions shown, 18 images · non-contrast
Comparison: CT 03/25/2022   
Count of known CT and Cardiac Nuclear Medicine studies performed in the previous 
12 months = 0.

________________________________________________________________________________________________ 
CT CHEST WITHOUT CONTRAST, 05/11/2023 [DATE]: 
CLINICAL INDICATION: Other nonspecific abnormal finding of lung field. 
A search for DICOM formatted images was conducted for prior CT imaging studies 
completed at a non-affiliated media free facility.
TECHNIQUE: The chest was scanned from base of neck through the lung bases 
without contrast on a high resolution low dose CT scanner. Routine MPR and MIP 
reconstruction images were performed.

[Series 2: chest 2.0 i31s 3 · axial · 0.88mm/px · z∈[-334,-16]mm · 12 of 175 slices shown, 15 images]
[im 8/175  mediastinal]
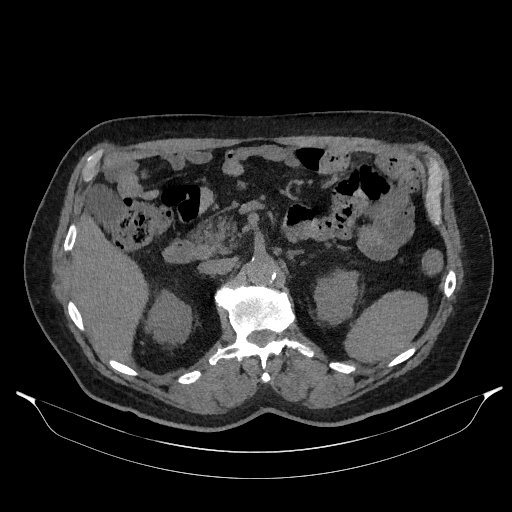
[im 8/175  lung]
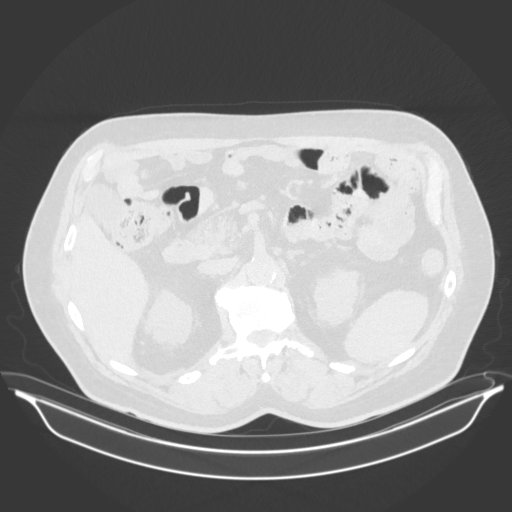
[im 24/175  lung]
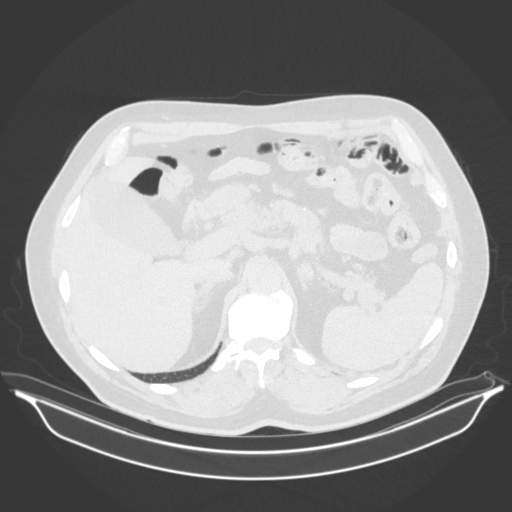
[im 40/175  lung]
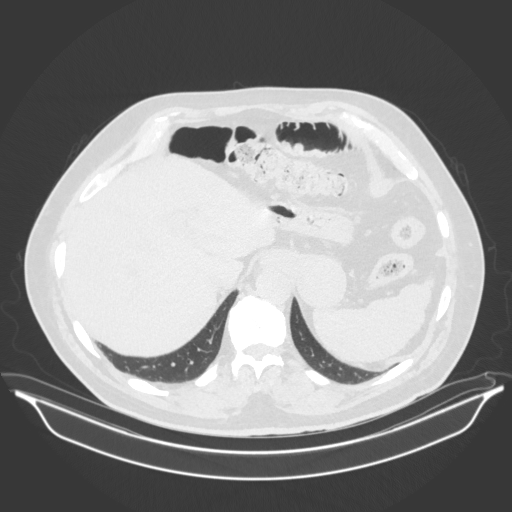
[im 56/175  lung]
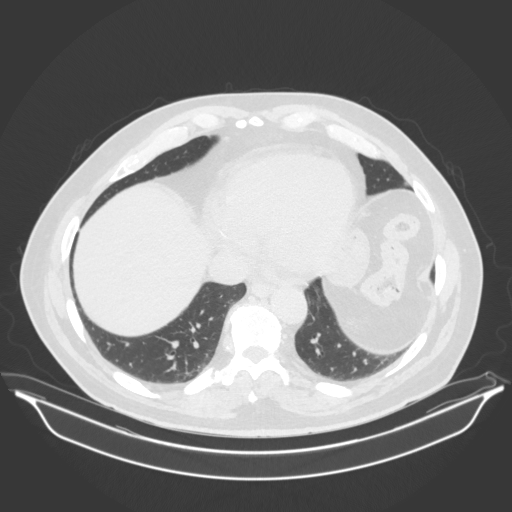
[im 64/175  mediastinal]
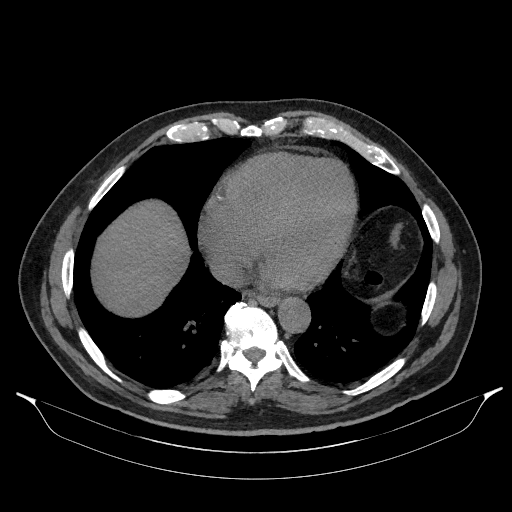
[im 64/175  lung]
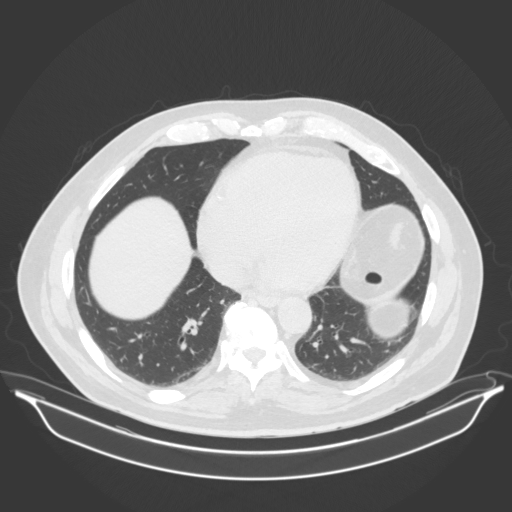
[im 80/175  lung]
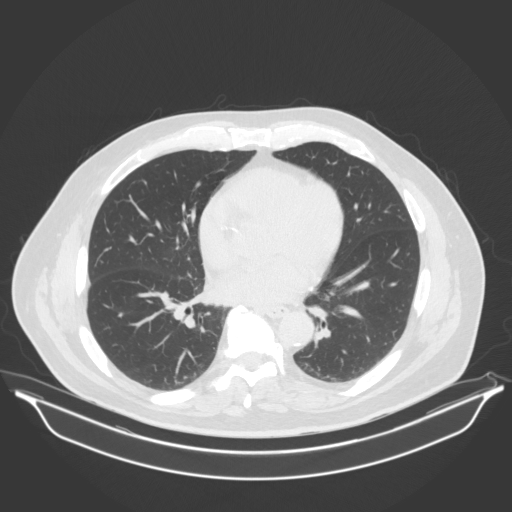
[im 95/175  lung]
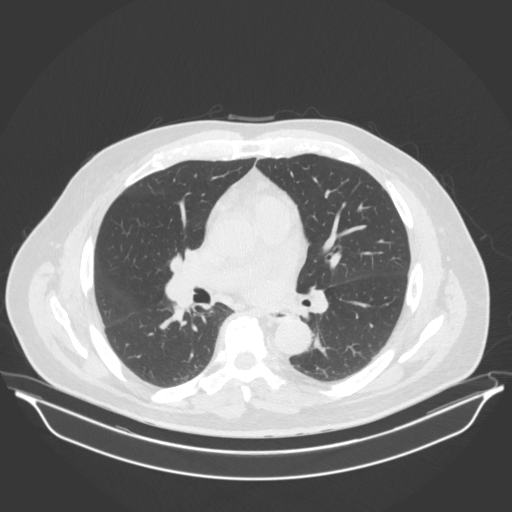
[im 111/175  lung]
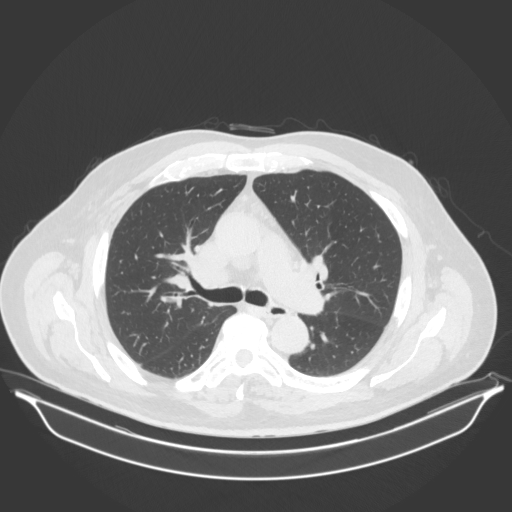
[im 119/175  mediastinal]
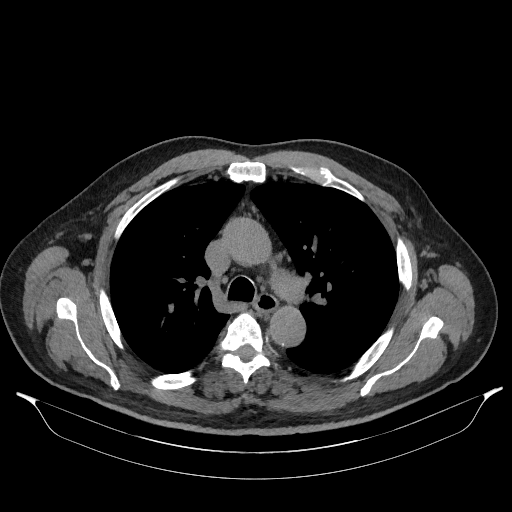
[im 119/175  lung]
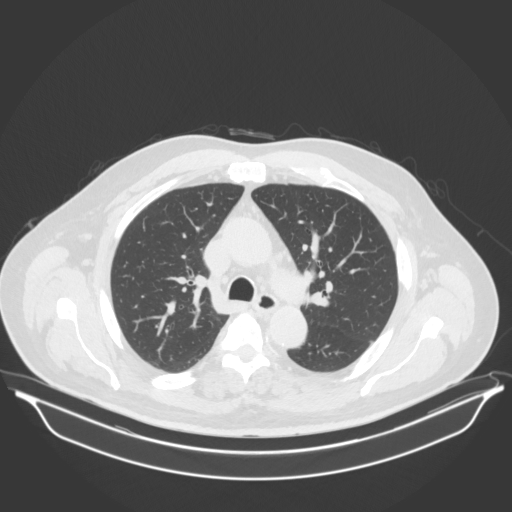
[im 135/175  lung]
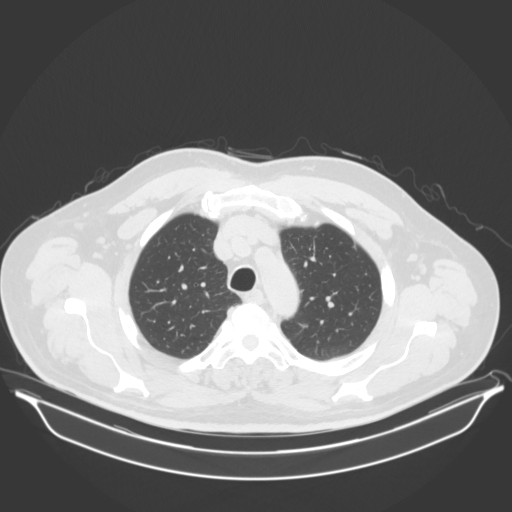
[im 151/175  lung]
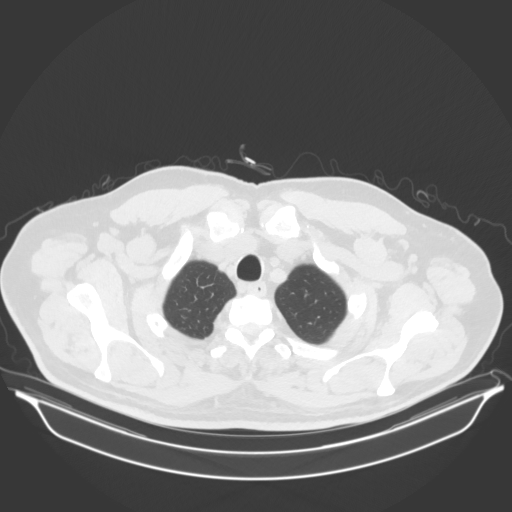
[im 167/175  lung]
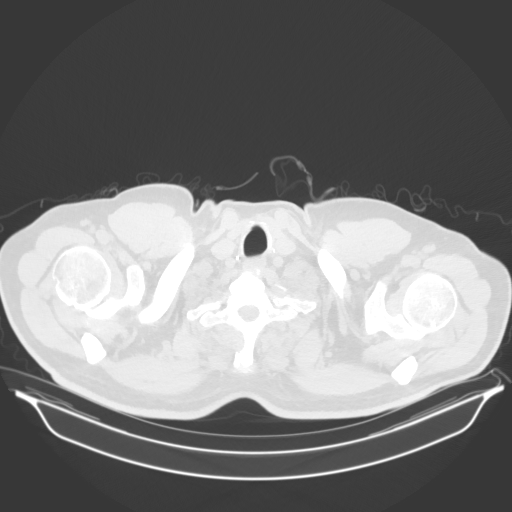

[Series 4: coronal · coronal · 0.71mm/px · 3 of 148 slices shown]
[im 30/148  lung]
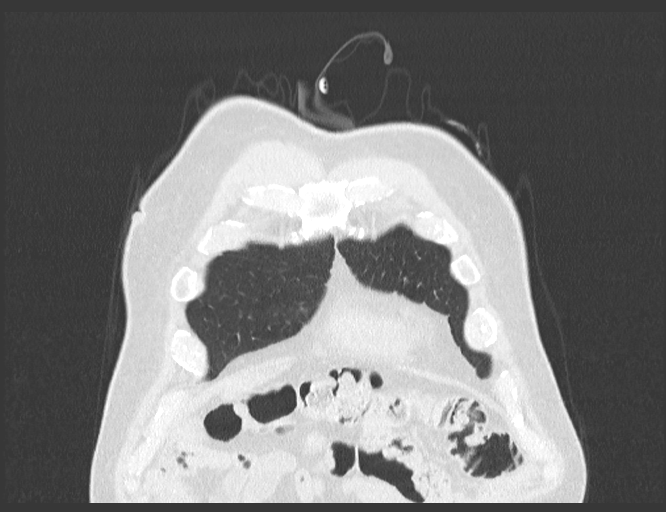
[im 59/148  lung]
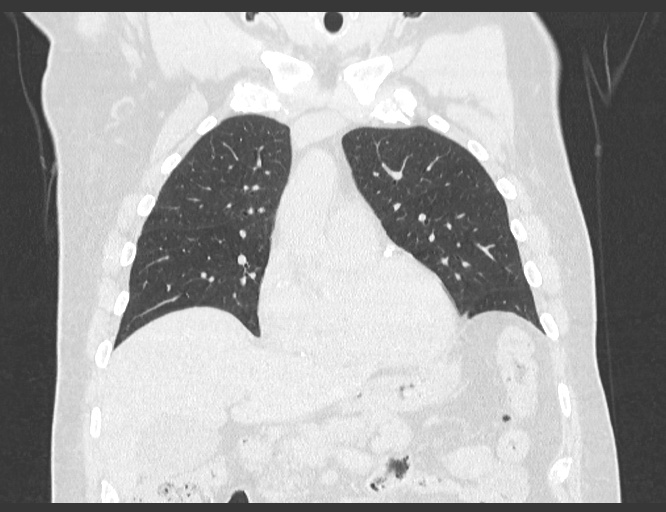
[im 89/148  lung]
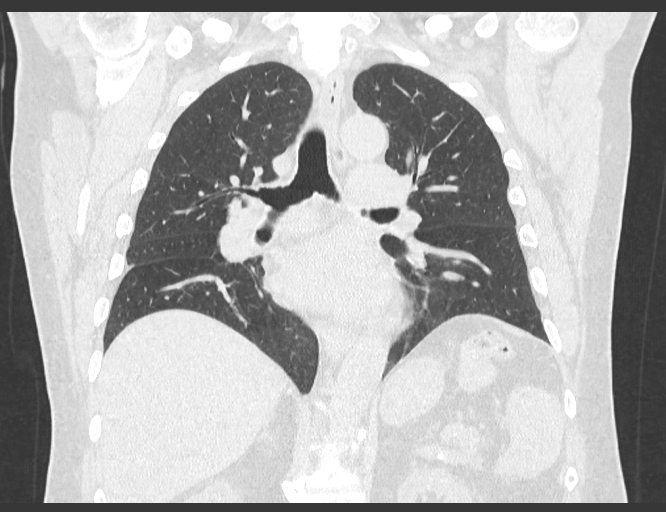

[15 of 36 positions shown; findings below may reference images not displayed]

FINDINGS: LUNGS AND PLEURA:  Scattered noncalcified nodules are stable dating back to 
March 2021 with the largest in the left lower lobe posteriorly measuring 8 
mm. No new suspicious pulmonary nodules. No acute consolidation or effusion.. No 
pleural effusion identified. 
MEDIASTINUM:  Stable nodular Heikky appearance of the anterior mediastinal fat 
dating back to April 2020. No new mass or adenopathy . Moderate coronary 
calcifications 
CHEST WALL/AXILLA: No mass or adenopathy.  
ABDOMEN: Liver and spleen are without evidence of focal mass. Moderate amount of 
stool within the colon.
IMPRESSION: Stable noncalcified pulmonary nodules largest the left lower lobe measuring 8 
mm. No new suspicious pulmonary nodules. 
No mediastinal or hilar adenopathy. 
Stable marbling anterior mediastinal fat. 
In patients between the ages of 50-77 where pulmonary emphysema is noted on CT, 
recommend evaluation for low dose lung cancer screening protocol if patient is 
not already enrolled; as pulmonary emphysema is an independent risk factor for 
lung cancer. 
RADIATION DOSE REDUCTION: All CT scans are performed using radiation dose 
reduction techniques, when applicable.  Technical factors are evaluated and 
adjusted to ensure appropriate moderation of exposure.  Automated dose 
management technology is applied to adjust the radiation doses to minimize 
exposure while achieving diagnostic quality images.

## 2024-04-04 ENCOUNTER — Ambulatory Visit (HOSPITAL_COMMUNITY)
Admission: RE | Admit: 2024-04-04 | Payer: PRIVATE HEALTH INSURANCE | Source: Ambulatory Visit | Admitting: Nuclear Radiology
